# Patient Record
Sex: Female | Born: 1940 | Race: White | Hispanic: No | Smoking: Former smoker
Health system: Southern US, Community
[De-identification: ages and names within clinical notes are randomized; demographics above are authoritative.]

## PROBLEM LIST (undated history)

## (undated) DIAGNOSIS — M549 Dorsalgia, unspecified: Secondary | ICD-10-CM

## (undated) DIAGNOSIS — Z9989 Dependence on other enabling machines and devices: Secondary | ICD-10-CM

## (undated) DIAGNOSIS — I059 Rheumatic mitral valve disease, unspecified: Secondary | ICD-10-CM

## (undated) DIAGNOSIS — G4733 Obstructive sleep apnea (adult) (pediatric): Secondary | ICD-10-CM

## (undated) DIAGNOSIS — E039 Hypothyroidism, unspecified: Secondary | ICD-10-CM

## (undated) DIAGNOSIS — I6529 Occlusion and stenosis of unspecified carotid artery: Secondary | ICD-10-CM

## (undated) DIAGNOSIS — I729 Aneurysm of unspecified site: Secondary | ICD-10-CM

## (undated) DIAGNOSIS — Z720 Tobacco use: Secondary | ICD-10-CM

## (undated) DIAGNOSIS — M8580 Other specified disorders of bone density and structure, unspecified site: Secondary | ICD-10-CM

## (undated) DIAGNOSIS — D519 Vitamin B12 deficiency anemia, unspecified: Secondary | ICD-10-CM

## (undated) DIAGNOSIS — I2510 Atherosclerotic heart disease of native coronary artery without angina pectoris: Secondary | ICD-10-CM

## (undated) DIAGNOSIS — K219 Gastro-esophageal reflux disease without esophagitis: Secondary | ICD-10-CM

## (undated) DIAGNOSIS — D509 Iron deficiency anemia, unspecified: Secondary | ICD-10-CM

## (undated) DIAGNOSIS — K859 Acute pancreatitis without necrosis or infection, unspecified: Secondary | ICD-10-CM

## (undated) HISTORY — DX: Iron deficiency anemia, unspecified: D50.9

## (undated) HISTORY — DX: Dependence on other enabling machines and devices: Z99.89

## (undated) HISTORY — DX: Hypothyroidism, unspecified: E03.9

## (undated) HISTORY — PX: TONSILLECTOMY: SUR1361

## (undated) HISTORY — PX: BREAST LUMPECTOMY: SHX2

## (undated) HISTORY — DX: Atherosclerotic heart disease of native coronary artery without angina pectoris: I25.10

## (undated) HISTORY — DX: Other specified disorders of bone density and structure, unspecified site: M85.80

## (undated) HISTORY — DX: Acute pancreatitis without necrosis or infection, unspecified: K85.90

## (undated) HISTORY — PX: LUMBAR FUSION: SHX111

## (undated) HISTORY — PX: ABDOMINAL HYSTERECTOMY: SHX81

## (undated) HISTORY — DX: Vitamin B12 deficiency anemia, unspecified: D51.9

## (undated) HISTORY — PX: APPENDECTOMY: SHX54

## (undated) HISTORY — DX: Gastro-esophageal reflux disease without esophagitis: K21.9

## (undated) HISTORY — DX: Dorsalgia, unspecified: M54.9

## (undated) HISTORY — DX: Obstructive sleep apnea (adult) (pediatric): G47.33

## (undated) HISTORY — DX: Rheumatic mitral valve disease, unspecified: I05.9

## (undated) HISTORY — DX: Occlusion and stenosis of unspecified carotid artery: I65.29

## (undated) HISTORY — DX: Aneurysm of unspecified site: I72.9

## (undated) HISTORY — DX: Tobacco use: Z72.0

---

## 1993-02-23 HISTORY — PX: CORONARY ARTERY BYPASS GRAFT: SHX141

## 1997-07-27 ENCOUNTER — Ambulatory Visit (HOSPITAL_COMMUNITY): Admission: RE | Admit: 1997-07-27 | Discharge: 1997-07-27 | Payer: Self-pay | Admitting: Cardiology

## 1998-07-05 ENCOUNTER — Ambulatory Visit (HOSPITAL_COMMUNITY): Admission: RE | Admit: 1998-07-05 | Discharge: 1998-07-05 | Payer: Self-pay | Admitting: Cardiovascular Disease

## 1998-07-05 ENCOUNTER — Encounter: Payer: Self-pay | Admitting: Cardiovascular Disease

## 1999-06-17 ENCOUNTER — Encounter: Payer: Self-pay | Admitting: Neurosurgery

## 1999-06-17 ENCOUNTER — Ambulatory Visit (HOSPITAL_COMMUNITY): Admission: RE | Admit: 1999-06-17 | Discharge: 1999-06-17 | Payer: Self-pay | Admitting: Neurosurgery

## 2001-02-23 HISTORY — PX: SAPHENOUS VEIN GRAFT RESECTION: SHX2374

## 2001-02-23 HISTORY — PX: CARDIAC CATHETERIZATION: SHX172

## 2001-04-20 ENCOUNTER — Inpatient Hospital Stay (HOSPITAL_COMMUNITY): Admission: AD | Admit: 2001-04-20 | Discharge: 2001-04-22 | Payer: Self-pay | Admitting: Cardiology

## 2001-04-20 ENCOUNTER — Encounter: Payer: Self-pay | Admitting: Cardiology

## 2001-07-22 ENCOUNTER — Encounter: Admission: RE | Admit: 2001-07-22 | Discharge: 2001-07-22 | Payer: Self-pay | Admitting: Internal Medicine

## 2001-08-19 ENCOUNTER — Inpatient Hospital Stay (HOSPITAL_COMMUNITY): Admission: RE | Admit: 2001-08-19 | Discharge: 2001-08-22 | Payer: Self-pay | Admitting: *Deleted

## 2001-08-19 ENCOUNTER — Encounter: Payer: Self-pay | Admitting: *Deleted

## 2001-08-20 ENCOUNTER — Encounter: Payer: Self-pay | Admitting: *Deleted

## 2001-08-25 ENCOUNTER — Inpatient Hospital Stay (HOSPITAL_COMMUNITY): Admission: EM | Admit: 2001-08-25 | Discharge: 2001-08-26 | Payer: Self-pay | Admitting: *Deleted

## 2001-08-30 ENCOUNTER — Encounter: Payer: Self-pay | Admitting: *Deleted

## 2001-08-30 ENCOUNTER — Encounter: Admission: RE | Admit: 2001-08-30 | Discharge: 2001-08-30 | Payer: Self-pay | Admitting: *Deleted

## 2001-09-07 ENCOUNTER — Ambulatory Visit: Admission: RE | Admit: 2001-09-07 | Discharge: 2001-09-07 | Payer: Self-pay | Admitting: Internal Medicine

## 2004-01-07 ENCOUNTER — Ambulatory Visit: Payer: Self-pay | Admitting: Family Medicine

## 2004-03-04 ENCOUNTER — Encounter: Payer: Self-pay | Admitting: Internal Medicine

## 2004-03-04 LAB — CONVERTED CEMR LAB: Pap Smear: NEGATIVE

## 2004-05-19 ENCOUNTER — Ambulatory Visit: Payer: Self-pay | Admitting: Cardiology

## 2004-07-28 ENCOUNTER — Ambulatory Visit: Payer: Self-pay | Admitting: Family Medicine

## 2006-01-27 ENCOUNTER — Ambulatory Visit: Payer: Self-pay | Admitting: Gastroenterology

## 2006-02-09 ENCOUNTER — Ambulatory Visit (HOSPITAL_COMMUNITY): Admission: RE | Admit: 2006-02-09 | Discharge: 2006-02-09 | Payer: Self-pay | Admitting: Gastroenterology

## 2006-02-25 ENCOUNTER — Ambulatory Visit (HOSPITAL_COMMUNITY): Admission: RE | Admit: 2006-02-25 | Discharge: 2006-02-25 | Payer: Self-pay | Admitting: Gastroenterology

## 2006-03-02 ENCOUNTER — Ambulatory Visit: Payer: Self-pay | Admitting: Gastroenterology

## 2006-03-15 ENCOUNTER — Ambulatory Visit: Payer: Self-pay | Admitting: Gastroenterology

## 2007-03-04 ENCOUNTER — Encounter: Payer: Self-pay | Admitting: Internal Medicine

## 2007-03-07 ENCOUNTER — Encounter: Payer: Self-pay | Admitting: Internal Medicine

## 2007-03-07 LAB — CONVERTED CEMR LAB
BUN: 7 mg/dL
Basophils Relative: 0.6 %
Chloride: 102 meq/L
Creatinine, Ser: 0.8 mg/dL
Glucose, Bld: 86 mg/dL
HDL: 25 mg/dL
Hemoglobin: 14.6 g/dL
LDL Cholesterol: 103 mg/dL
Lymphocytes, automated: 43.4 %
Monocytes Relative: 2.9 %
Neutrophils Relative %: 51.8 %
Potassium: 5.3 meq/L
Sodium: 136 meq/L
Triglyceride fasting, serum: 287 mg/dL
WBC: 7.2 10*3/uL

## 2007-03-09 ENCOUNTER — Encounter: Payer: Self-pay | Admitting: Internal Medicine

## 2007-04-30 ENCOUNTER — Emergency Department (HOSPITAL_COMMUNITY): Admission: EM | Admit: 2007-04-30 | Discharge: 2007-04-30 | Payer: Self-pay | Admitting: Emergency Medicine

## 2007-05-04 ENCOUNTER — Encounter: Payer: Self-pay | Admitting: Internal Medicine

## 2007-05-04 LAB — CONVERTED CEMR LAB
Eosinophils Relative: 1 %
HCT: 44.3 %
HDL: 40 mg/dL
Hgb A1c MFr Bld: 5.2 %
LDL Cholesterol: 88 mg/dL
MCV: 94.9 fL
Monocytes Relative: 4 %
Neutrophils Relative %: 64 %
RBC: 4.67 M/uL
RDW: 14 %

## 2007-06-29 ENCOUNTER — Ambulatory Visit: Payer: Self-pay | Admitting: Cardiology

## 2007-09-05 ENCOUNTER — Ambulatory Visit: Payer: Self-pay

## 2007-09-05 ENCOUNTER — Encounter: Payer: Self-pay | Admitting: Cardiology

## 2007-09-06 ENCOUNTER — Encounter: Payer: Self-pay | Admitting: Internal Medicine

## 2008-04-02 ENCOUNTER — Emergency Department (HOSPITAL_COMMUNITY): Admission: EM | Admit: 2008-04-02 | Discharge: 2008-04-02 | Payer: Self-pay | Admitting: Emergency Medicine

## 2008-09-29 ENCOUNTER — Encounter: Payer: Self-pay | Admitting: Internal Medicine

## 2008-09-29 LAB — CONVERTED CEMR LAB
ALT: 17 units/L
AST: 24 units/L
Alkaline Phosphatase: 57 units/L
Calcium: 9.7 mg/dL
Hemoglobin: 14.9 g/dL
Lymphocytes, automated: 34 %
MCV: 92 fL
Monocytes Relative: 4 %
Neutrophils Relative %: 59 %
Sodium: 141 meq/L
Total Bilirubin: 0.2 mg/dL

## 2008-10-31 ENCOUNTER — Encounter: Payer: Self-pay | Admitting: Internal Medicine

## 2008-11-06 ENCOUNTER — Encounter: Payer: Self-pay | Admitting: Internal Medicine

## 2008-11-06 LAB — HM MAMMOGRAPHY

## 2008-12-31 DIAGNOSIS — K859 Acute pancreatitis without necrosis or infection, unspecified: Secondary | ICD-10-CM | POA: Insufficient documentation

## 2008-12-31 DIAGNOSIS — G4730 Sleep apnea, unspecified: Secondary | ICD-10-CM | POA: Insufficient documentation

## 2008-12-31 DIAGNOSIS — E039 Hypothyroidism, unspecified: Secondary | ICD-10-CM | POA: Insufficient documentation

## 2008-12-31 DIAGNOSIS — E119 Type 2 diabetes mellitus without complications: Secondary | ICD-10-CM

## 2008-12-31 DIAGNOSIS — I2510 Atherosclerotic heart disease of native coronary artery without angina pectoris: Secondary | ICD-10-CM | POA: Insufficient documentation

## 2009-02-27 ENCOUNTER — Ambulatory Visit: Payer: Self-pay | Admitting: Cardiology

## 2009-02-27 DIAGNOSIS — I6529 Occlusion and stenosis of unspecified carotid artery: Secondary | ICD-10-CM

## 2009-02-27 DIAGNOSIS — F17200 Nicotine dependence, unspecified, uncomplicated: Secondary | ICD-10-CM | POA: Insufficient documentation

## 2009-02-27 DIAGNOSIS — I059 Rheumatic mitral valve disease, unspecified: Secondary | ICD-10-CM | POA: Insufficient documentation

## 2009-03-01 ENCOUNTER — Encounter: Payer: Self-pay | Admitting: Internal Medicine

## 2009-03-01 LAB — CONVERTED CEMR LAB
Alkaline Phosphatase: 101 units/L
Basophils Relative: 2 %
CO2: 26 meq/L
Chloride: 99 meq/L
Creatinine, Ser: 0.79 mg/dL
Eosinophils Relative: 1 %
HCT: 44.1 %
Monocytes Relative: 5 %
Neutrophils Relative %: 47 %
RBC: 4.9 M/uL
Sodium: 138 meq/L
TSH: 1.74 microintl units/mL
WBC: 8.1 10*3/uL

## 2009-03-12 ENCOUNTER — Ambulatory Visit: Payer: Self-pay

## 2009-03-12 ENCOUNTER — Ambulatory Visit (HOSPITAL_COMMUNITY): Admission: RE | Admit: 2009-03-12 | Discharge: 2009-03-12 | Payer: Self-pay | Admitting: Cardiology

## 2009-03-12 ENCOUNTER — Telehealth (INDEPENDENT_AMBULATORY_CARE_PROVIDER_SITE_OTHER): Payer: Self-pay | Admitting: *Deleted

## 2009-03-12 ENCOUNTER — Ambulatory Visit: Payer: Self-pay | Admitting: Cardiology

## 2009-03-12 ENCOUNTER — Encounter: Payer: Self-pay | Admitting: Cardiology

## 2009-03-13 ENCOUNTER — Encounter: Payer: Self-pay | Admitting: Internal Medicine

## 2009-03-13 ENCOUNTER — Telehealth: Payer: Self-pay | Admitting: Cardiology

## 2009-03-25 ENCOUNTER — Encounter: Payer: Self-pay | Admitting: Internal Medicine

## 2009-06-07 ENCOUNTER — Encounter: Payer: Self-pay | Admitting: Internal Medicine

## 2009-06-07 LAB — CONVERTED CEMR LAB
ALT: 15 units/L
CO2: 28 meq/L
Chloride: 100 meq/L
Eosinophils Relative: 1 %
Lymphocytes, automated: 50 %
Monocytes Relative: 10 %
Neutrophils Relative %: 37 %
RDW: 13.6 %
Total Bilirubin: 0.2 mg/dL

## 2009-10-07 ENCOUNTER — Ambulatory Visit: Payer: Self-pay | Admitting: Internal Medicine

## 2009-10-07 DIAGNOSIS — G8929 Other chronic pain: Secondary | ICD-10-CM

## 2009-10-07 DIAGNOSIS — M549 Dorsalgia, unspecified: Secondary | ICD-10-CM

## 2009-10-07 DIAGNOSIS — K219 Gastro-esophageal reflux disease without esophagitis: Secondary | ICD-10-CM

## 2009-10-07 DIAGNOSIS — N289 Disorder of kidney and ureter, unspecified: Secondary | ICD-10-CM | POA: Insufficient documentation

## 2009-10-07 DIAGNOSIS — D509 Iron deficiency anemia, unspecified: Secondary | ICD-10-CM | POA: Insufficient documentation

## 2009-10-09 ENCOUNTER — Encounter: Payer: Self-pay | Admitting: Internal Medicine

## 2009-10-15 ENCOUNTER — Telehealth: Payer: Self-pay | Admitting: Internal Medicine

## 2009-10-22 ENCOUNTER — Encounter: Payer: Self-pay | Admitting: Internal Medicine

## 2009-12-05 ENCOUNTER — Encounter: Payer: Self-pay | Admitting: Internal Medicine

## 2009-12-06 ENCOUNTER — Encounter: Payer: Self-pay | Admitting: Internal Medicine

## 2009-12-16 ENCOUNTER — Telehealth: Payer: Self-pay | Admitting: Internal Medicine

## 2010-01-15 ENCOUNTER — Encounter: Payer: Self-pay | Admitting: Internal Medicine

## 2010-03-05 ENCOUNTER — Telehealth: Payer: Self-pay | Admitting: Internal Medicine

## 2010-03-14 ENCOUNTER — Ambulatory Visit: Admit: 2010-03-14 | Payer: Self-pay | Admitting: Internal Medicine

## 2010-03-16 ENCOUNTER — Encounter: Payer: Self-pay | Admitting: Gastroenterology

## 2010-03-16 ENCOUNTER — Encounter: Payer: Self-pay | Admitting: Neurological Surgery

## 2010-03-27 NOTE — Progress Notes (Signed)
Summary: med refills  Phone Note Refill Request Message from:  Fax from Pharmacy on October 15, 2009 3:46 PM  Refills Requested: Medication #1:  AMITRIPTYLINE HCL 50 MG TABS take 2 at bedtime  Medication #2:  OMEPRAZOLE 20 MG CPDR take 2 by mouth once daily [facility] [phone]  Initial call taken by: Orlan Leavens RMA,  October 15, 2009 3:47 PM    New/Updated Medications: PREMARIN 0.625 MG TABS (ESTROGENS CONJUGATED) take 1 by mouth once daily Prescriptions: PREMARIN 0.625 MG TABS (ESTROGENS CONJUGATED) take 1 by mouth once daily  #30 x 6   Entered by:   Orlan Leavens RMA   Authorized by:   Newt Lukes MD   Signed by:   Orlan Leavens RMA on 10/15/2009   Method used:   Faxed to ...       [facility] [address] (retail)       [address]       [place], [place]  [address]       Ph: [phone] or [phone]       Fax: [phone]   RxID:   [id number] OMEPRAZOLE 20 MG CPDR (OMEPRAZOLE) take 2 by mouth once daily  #60 x 6   Entered by:   Orlan Leavens RMA   Authorized by:   Newt Lukes MD   Signed by:   Orlan Leavens RMA on 10/15/2009   Method used:   Faxed to ...       [facility] [address] (retail)       [address]       [place], [place]  [address]       Ph: [phone] or [phone]       Fax: [phone]   RxID:   [id number] AMITRIPTYLINE HCL 50 MG TABS (AMITRIPTYLINE HCL) take 2 at bedtime  #60 x 6   Entered by:   Orlan Leavens RMA   Authorized by:   Newt Lukes MD   Signed by:   Orlan Leavens RMA on 10/15/2009   Method used:   Faxed to ...       [facility] [address] (retail)       [address]       [place], [place]  [address]       Ph: [phone] or [phone]       Fax: [phone]   RxID:   [id number]

## 2010-03-27 NOTE — Letter (Signed)
Summary: [facility]   Imported By: Lennie Odor 01/22/2010 14:12:31  _____________________________________________________________________  External Attachment:    Type:   Image     Comment:   External Document

## 2010-03-27 NOTE — Letter (Signed)
Summary: [facility]   Imported By: Sherian Rein 12/16/2009 15:55:12  _____________________________________________________________________  External Attachment:    Type:   Image     Comment:   External Document

## 2010-03-27 NOTE — Assessment & Plan Note (Signed)
Summary: [facility]   Visit Type:  Follow-up Primary Provider:  Eden Lathe PAc  CC:  CAD.  History of Present Illness: The patient presents for follow up of her known CAD.  Since I last saw her she has had no new cardiovascular complaints. She is in between primary care providers at this point. She says she did have lipids done about 3 months ago and was told everything was "all right".  She reports that she is active doing housework but does not exercise routinely. With activity such as going up and down stairs and vacuuming she does not get chest pressure, neck or arm discomfort. She does not have palpitations, presyncope or syncope. She does not have PND or orthopnea. Unfortunately she continues to smoke cigarettes.  Current Medications (verified): 1)  Synthroid 112 Mcg Tabs (Levothyroxine Sodium) .Marland Kitchen.. 1 By Mouth Daily 2)  Klor-Con M20 20 Meq Cr-Tabs (Potassium Chloride Crys Cr) .Marland Kitchen.. 1 By Mouth Daily 3)  Premarin 0.3 Mg Tabs (Estrogens Conjugated) .Marland Kitchen.. 1 By Mouth Daily 4)  Atenolol 50 Mg Tabs (Atenolol) .Marland Kitchen.. 1 By Mouth Daily 5)  Amitriptyline Hcl 50 Mg Tabs (Amitriptyline Hcl) .Marland Kitchen.. 1 By Mouth At Bedtime 6)  Nitrostat 0.4 Mg Subl (Nitroglycerin) .... As Needed 7)  Aspirin 325 Mg  Tabs (Aspirin) .... As Needed  Allergies (verified): 1)  ! Cipro 2)  ! Macrodantin 3)  ! Morphine  Past History:  Past Medical History: 1. Coronary artery disease (status post CABG in 1995 in [place],     [place].  Last catheterization in 2003 demonstrated left main 20%     stenosis, LAD 50-70% mid stenosis, circumflex 40-50% stenosis in     the AV groove, 30% right coronary artery stenosis.  Saphenous vein     graft to the marginal was patent.  A LIMA to the LAD was patent.) 2. Pseudoaneurysm requiring repair following her last catheterization. 3. Sleep apnea treated with CPAP. 4. Pancreatitis. 5. Hypothyroidism. 6. Diabetes mellitus.  7. Tobacco abuse 8. Ostopenia  Past Surgical  History: Appendectomy, back surgery, breast lumpectomy, fusion of lumbar       spine, hysterectomy,  tonsillectomy, CABG  Review of Systems       As stated in the HPI and negative for all other systems.   Vital Signs:  Patient profile:   70 year old female Height:      67 inches Weight:      152 pounds BMI:     23.89 Pulse rate:   62 / minute Resp:     16 per minute BP sitting:   142 / 86  (right arm)  Vitals Entered By: Marrion Coy, CNA (February 27, 2009 11:31 AM)  Physical Exam  General:  Well developed, well nourished, in no acute distress. Head:  normocephalic and atraumatic Eyes:  PERRLA/EOM intact; conjunctiva and lids normal. Mouth:  Teeth, gums and palate normal. Oral mucosa normal. Neck:  Neck supple, no JVD. No masses, thyromegaly or abnormal cervical nodes. Chest Wall:  no deformities or breast masses noted Lungs:  Clear bilaterally to auscultation and percussion. Heart:  Non-displaced PMI, chest non-tender; regular rate and rhythm, S1, S2 without murmurs, rubs or gallops. Carotid upstroke normal, no bruit. Normal abdominal aortic size, no bruits. Femorals normal pulses, no bruits. Pedals normal pulses. No edema, no varicosities. Abdomen:  Bowel sounds positive; abdomen soft and non-tender without masses, organomegaly, or hernias noted. No hepatosplenomegaly. Msk:  Back normal, normal gait. Muscle strength and tone normal. Extremities:  No clubbing or cyanosis. Neurologic:  Alert and oriented x 3. Skin:  Intact without lesions or rashes. Cervical Nodes:  no significant adenopathy Axillary Nodes:  no significant adenopathy Inguinal Nodes:  no significant adenopathy Psych:  Normal affect.   Detailed Cardiovascular Exam  Neck    Carotids: Carotids full and equal bilaterally without bruits.      Neck Veins: Normal, no JVD.    Heart    Inspection: no deformities or lifts noted.      Palpation: normal PMI with no thrills palpable.      Auscultation: S1 and S2  within normal limits, no S3, no S4, no clicks, no rubs, 2/6 apical systolic murmur radiating slightly anteriorly and to the axilla, holosystolic, no diastolic murmurs.  Vascular    Abdominal Aorta: no palpable masses, pulsations, or audible bruits.      Femoral Pulses: normal femoral pulses bilaterally.      Pedal Pulses: normal pedal pulses bilaterally.      Radial Pulses: normal radial pulses bilaterally.      Peripheral Circulation: no clubbing, cyanosis, or edema noted with normal capillary refill.     EKG  Procedure date:  02/27/2009  Findings:      sinus rhythm, rate 62, right axis deviation, old anteroseptal infarct, no acute ST-T wave changes.  Impression & Recommendations:  Problem # 1:  CAROTID ARTERY STENOSIS (ICD-433.10) The patient had nonobstructive carotid disease on Doppler in 2003. I will repeat this study. Orders: Carotid Duplex (Carotid Duplex)  Problem # 2:  MITRAL VALVE DISORDERS (ICD-424.0) It has been 18 months since she had an echocardiogram to followup moderate mitral regurgitation. I will repeat this study. Orders: Echocardiogram (Echo)  Problem # 3:  CAD (ICD-414.00) She has no new symptoms. We will continue to emphasize risk reduction. Orders: EKG w/ Interpretation (93000)  Problem # 4:  TOBACCO ABUSE (ICD-305.1) We discussed the need to stop smoking. (Greater than 3 minutes). She didn't tolerate Chantix. She can't afford patches. She is going to try cold perky.  Patient Instructions: 1)  Your physician recommends that you schedule a follow-up appointment in: 12 months in [place] 2)  Your physician recommends that you continue on your current medications as directed. Please refer to the Current Medication list given to you today. 3)  Your physician has requested that you have an echocardiogram.  Echocardiography is a painless test that uses sound waves to create images of your heart. It provides your doctor with information about the size and shape  of your heart and how well your heart's chambers and valves are working.  This procedure takes approximately one hour. There are no restrictions for this procedure. 4)  Your physician has requested that you have a carotid duplex. This test is an ultrasound of the carotid arteries in your neck. It looks at blood flow through these arteries that supply the brain with blood. Allow one hour for this exam. There are no restrictions or special instructions.

## 2010-03-27 NOTE — Progress Notes (Signed)
Summary: levothyroxine  Phone Note Refill Request Message from:  Fax from Pharmacy on December 16, 2009 9:54 AM  Refills Requested: Medication #1:  SYNTHROID 112 MCG TABS 1 by mouth daily  Method Requested: Electronic Initial call taken by: Orlan Leavens RMA,  December 16, 2009 9:54 AM    Prescriptions: SYNTHROID 112 MCG TABS (LEVOTHYROXINE SODIUM) 1 by mouth daily  #30 x 6   Entered by:   Orlan Leavens RMA   Authorized by:   Newt Lukes MD   Signed by:   Orlan Leavens RMA on 12/16/2009   Method used:   Electronically to        [facility] [address] (retail)       [address]       [place], [place]  [address]       Ph: [phone] or [phone]       Fax: [phone]   RxID:   [id number]

## 2010-03-27 NOTE — Progress Notes (Signed)
Summary: returning call  Phone Note Call from Patient Call back at Home Phone [phone]   Caller: Patient Reason for Call: Talk to Nurse Summary of Call: returning call Initial call taken by: Migdalia Dk,  March 13, 2009 11:47 AM  Follow-up for Phone Call        pt aware of results Follow-up by: Charolotte Capuchin, RN,  March 13, 2009 12:31 PM

## 2010-03-27 NOTE — Letter (Signed)
Summary: [facility]  [facility]   Imported By: Lennie Odor 12/06/2009 14:11:56  _____________________________________________________________________  External Attachment:    Type:   Image     Comment:   External Document

## 2010-03-27 NOTE — Progress Notes (Signed)
  Walk in Patient Form Recieved " Left Lab work" forward to Pam/Hochrein [facility]  March 12, 2009 3:09 PM

## 2010-03-27 NOTE — Miscellaneous (Signed)
Summary: Flu Vaccine / [facility]  Flu Vaccine / [facility]   Imported By: Lennie Odor 10/31/2009 15:12:23  _____________________________________________________________________  External Attachment:    Type:   Image     Comment:   External Document

## 2010-03-27 NOTE — Letter (Signed)
Summary: [facility]  [facility]   Imported By: Lennie Odor 12/06/2009 14:17:53  _____________________________________________________________________  External Attachment:    Type:   Image     Comment:   External Document

## 2010-03-27 NOTE — Letter (Signed)
Summary: [facility]  [facility]   Imported By: Lennie Odor 12/06/2009 14:18:15  _____________________________________________________________________  External Attachment:    Type:   Image     Comment:   External Document

## 2010-03-27 NOTE — Assessment & Plan Note (Signed)
Summary: new pt/medicaid/medicare/#/lb   Vital Signs:  Patient profile:   70 year old female Height:      67 inches (170.18 cm) Weight:      155.8 pounds (70.82 kg) O2 Sat:      95 % on Room air Temp:     97.1 degrees F (36.17 degrees C) oral Pulse rate:   77 / minute BP sitting:   120 / 70  (left arm) Cuff size:   regular  Vitals Entered By: Orlan Leavens RMA (October 07, 2009 1:53 PM)  O2 Flow:  Room air CC: New patient Is Patient Diabetic? No Pain Assessment Patient in pain? no        Primary Care Provider:  Newt Lukes MD  CC:  New patient.  History of Present Illness: new pt to me and our division, here to est care known to our card div but new to PC  1) chronic back pain - follows with ramos for same - daily narc use (mgmt by ramos) - pain radiates into legs - causes pain with standing or walking - no falls or bowel change - hoping for new steroid injection in near future  2) mass on left kidney - stable in size x 5 years -  no hematuria or flank pain - reports she is due for blood work to followup on kidney but denies ever seeing surg or renal specialist  3) hypothyroid - reports compliance with ongoing medical treatment and no changes in medication dose or frequency. denies adverse side effects related to current therapy. no weight, skin or bowel changes  4) DM2 hx - reports no longer dm pt since losing 50# - does not check sugras - does not take meds -  Preventive Screening-Counseling & Management  Alcohol-Tobacco     Alcohol drinks/day: 0     Alcohol Counseling: not indicated; patient does not drink     Smoking Status: current     Smoking Cessation Counseling: yes     Tobacco Counseling: to quit use of tobacco products  Caffeine-Diet-Exercise     Diet Counseling: not indicated; diet is assessed to be healthy     Exercise Counseling: to improve exercise regimen     Depression Counseling: not indicated; screening negative for  depression  Safety-Violence-Falls     Seat Belt Counseling: not indicated; patient wears seat belts     Helmet Counseling: not indicated; patient wears helmet when riding bicycle/motocycle     Fall Risk Counseling: not indicated; no significant falls noted  Current Medications (verified): 1)  Synthroid 112 Mcg Tabs (Levothyroxine Sodium) .Marland Kitchen.. 1 By Mouth Daily 2)  Klor-Con M20 20 Meq Cr-Tabs (Potassium Chloride Crys Cr) .Marland Kitchen.. 1 By Mouth Daily 3)  Premarin 0.3 Mg Tabs (Estrogens Conjugated) .Marland Kitchen.. 1 By Mouth Daily 4)  Atenolol 50 Mg Tabs (Atenolol) .Marland Kitchen.. 1 By Mouth Daily 5)  Amitriptyline Hcl 50 Mg Tabs (Amitriptyline Hcl) .... Take 2 At Bedtime 6)  Nitrostat 0.4 Mg Subl (Nitroglycerin) .... As Needed 7)  Aspirin 325 Mg  Tabs (Aspirin) .... As Needed 8)  Cyanocobalamin 1000 Mcg/ml Soln (Cyanocobalamin) .... Take Injectin Q 2 Weeks 9)  Meclizine Hcl 25 Mg Tabs (Meclizine Hcl) .... Take As Needed 10)  Omeprazole 20 Mg Cpdr (Omeprazole) .... Take 2 By Mouth Once Daily 11)  Oxycontin 10 Mg Xr12h-Tab (Oxycodone Hcl) .... Take 1 Q 12 Hours As Needed  Allergies (verified): 1)  ! Cipro 2)  ! Macrodantin 3)  ! Morphine  Past History:  Past Medical History: 1. Coronary artery disease (status post CABG in 1995 in [place],  [place].      Last catheterization in 2003 demonstrated left main 20%     stenosis, LAD 50-70% mid stenosis, circumflex 40-50% stenosis in     the AV groove, 30% right coronary artery stenosis.  Saphenous vein     graft to the marginal was patent.  A LIMA to the LAD was patent.) 2. Pseudoaneurysm requiring repair following her last catheterization. 3. Sleep apnea treated with CPAP. 4. Pancreatitis, hx 5. Hypothyroidism. 6. Diabetes mellitus, diet controlled since weight loss 7. Tobacco abuse 8. Ostopenia Anemia-iron deficiency GERD  MD roster: cards - hochrein pain - ramos  Past Surgical History: Appendectomy Coronary artery bypass graft - 1995 Hysterectomy Lumbar  fusion  Lumpectomy, breast Tonsillectomy  Family History: Family History High cholesterol (parent) Family History Hypertension (parent) Stroke (parent)     Social History: Lives in [place] and quit smoking years ago.  then resumed smoking 2009 - now 1ppd widowed 03/2009, now lives alone - No regular exercise.Smoking Status:  current  Review of Systems       see HPI above. I have reviewed all other systems and they were negative.   Physical Exam  General:  weathered but alert, well-developed, well-nourished, and cooperative to examination.   heavy tobacco smell in room Head:  Normocephalic and atraumatic without obvious abnormalities. No apparent alopecia or balding. Eyes:  vision grossly intact; pupils equal, round and reactive to light.  conjunctiva and lids normal.    Ears:  normal pinnae bilaterally, without erythema, swelling, or tenderness to palpation. TMs clear, without effusion, or cerumen impaction. Hearing grossly normal bilaterally  Mouth:  upper teeth plate and gums in good repair; mucous membranes moist, without lesions or ulcers. oropharynx clear without exudate, no erythema.  Neck:  supple, full ROM, no masses, no thyromegaly; no thyroid nodules or tenderness. no JVD or carotid bruits.   Lungs:  normal respiratory effort, no intercostal retractions or use of accessory muscles; normal breath sounds bilaterally - no crackles and no wheezes.    Heart:  normal rate, regular rhythm, no murmur, and no rub. BLE without edema.  Abdomen:  soft, non-tender, normal bowel sounds, no distention; no masses and no appreciable hepatomegaly or splenomegaly.   Msk:  mild kyphosis Neurologic:  alert & oriented X3 and cranial nerves II-XII symetrically intact.  strength normal in all extremities, sensation intact to light touch, and gait normal. speech fluent without dysarthria or aphasia; follows commands with good comprehension.  Psych:  Oriented X3, memory intact for recent and remote,  normally interactive, good eye contact, not anxious appearing, not depressed appearing, and not agitated.      Impression & Recommendations:  Problem # 1:  KIDNEY DISEASE (ICD-593.9) ?mass per pt report - stable on imaging for >5 yrs - send for prior records and eval further if/as needed  Problem # 2:  TOBACCO ABUSE (ICD-305.1)  5 minutes today spent on patient education regarding the unhealthy effects of continued tobacco abuse and encouragment of cessation including medical options available to help patient to quit smoking.   Orders: Tobacco use cessation intermediate 3-10 minutes ([address])  Problem # 3:  DM (ICD-250.00) diet controlled since >50# wt loss years ago - send for records and review last a1c - check if/as needed Her updated medication list for this problem includes:    Aspirin 325 Mg Tabs (Aspirin) .Marland Kitchen... As needed  Problem # 4:  HYPOTHYROIDISM (ICD-244.9) no  dose changes in years - send for last labs - check TSH and adjust as needed next OV Her updated medication list for this problem includes:    Synthroid 112 Mcg Tabs (Levothyroxine sodium) .Marland Kitchen... 1 by mouth daily  Problem # 5:  CAD (ICD-414.00)  Her updated medication list for this problem includes:    Atenolol 50 Mg Tabs (Atenolol) .Marland Kitchen... 1 by mouth daily    Nitrostat 0.4 Mg Subl (Nitroglycerin) .Marland Kitchen... As needed    Aspirin 325 Mg Tabs (Aspirin) .Marland Kitchen... As needed    Bumetanide 0.5 Mg Tabs (Bumetanide) .Marland Kitchen... 1 by mouth once daily as needed for swelling/fluid  She has no active anginal symptoms. Continue to emphasize risk reduction, esp smoking cessation  Orders: Tobacco use cessation intermediate 3-10 minutes ([address])  Complete Medication List: 1)  Synthroid 112 Mcg Tabs (Levothyroxine sodium) .Marland Kitchen.. 1 by mouth daily 2)  Klor-con M20 20 Meq Cr-tabs (Potassium chloride crys cr) .Marland Kitchen.. 1 by mouth daily 3)  Premarin 0.3 Mg Tabs (Estrogens conjugated) .Marland Kitchen.. 1 by mouth daily 4)  Atenolol 50 Mg Tabs (Atenolol) .Marland Kitchen.. 1 by  mouth daily 5)  Amitriptyline Hcl 50 Mg Tabs (Amitriptyline hcl) .... Take 2 at bedtime 6)  Nitrostat 0.4 Mg Subl (Nitroglycerin) .... As needed 7)  Aspirin 325 Mg Tabs (Aspirin) .... As needed 8)  Cyanocobalamin 1000 Mcg/ml Soln (Cyanocobalamin) .... Take injectin q 2 weeks 9)  Meclizine Hcl 25 Mg Tabs (Meclizine hcl) .... Take as needed 10)  Omeprazole 20 Mg Cpdr (Omeprazole) .... Take 2 by mouth once daily 11)  Oxycontin 10 Mg Xr12h-tab (Oxycodone hcl) .... Take 1 q 12 hours as needed 12)  Bumetanide 0.5 Mg Tabs (Bumetanide) .Marland Kitchen.. 1 by mouth once daily as needed for swelling/fluid  Patient Instructions: 1)  it was good to see you today. 2)  medications and history reviewed today - no changes recommended today - 3)  will send for records from dr. Eula Fried to review and then order tests as needed  4)  Tobacco is very bad for your health and your loved ones! You Should stop smoking! use nicotine patches or gum to help yourself do this 5)  continue to go to dr.ramos for your pain meds as needed  6)  Please schedule a follow-up appointment in 3-6 months to review records and medications and lab tests, call sooner if problems.

## 2010-03-27 NOTE — Progress Notes (Signed)
Summary: b12   Phone Note Refill Request Message from:  Fax from Pharmacy on March 05, 2010 1:01 PM  Refills Requested: Medication #1:  CYANOCOBALAMIN 1000 MCG/ML SOLN take injectin q 2 weeks 30ml [facility]   Method Requested: Electronic Initial call taken by: Orlan Leavens RMA,  March 05, 2010 1:01 PM  Follow-up for Phone Call        We never filled B12 for patient. is this ok to send rx Follow-up by: Orlan Leavens RMA,  March 05, 2010 1:03 PM  Additional Follow-up for Phone Call Additional follow up Details #1::        yes - ok to fill as requested Additional Follow-up by: Newt Lukes MD,  March 05, 2010 1:06 PM    Prescriptions: CYANOCOBALAMIN 1000 MCG/ML SOLN (CYANOCOBALAMIN) take injectin q 2 weeks  #47ml x 1   Entered by:   Orlan Leavens RMA   Authorized by:   Newt Lukes MD   Signed by:   Orlan Leavens RMA on 03/05/2010   Method used:   Electronically to        [facility] [address] (retail)       [address]       [place], [place]  [address]       Ph: [phone] or [phone]       Fax: [phone]   RxID:   [id number]

## 2010-03-27 NOTE — Consult Note (Signed)
Summary: [facility]   Imported By: Sherian Rein 12/16/2009 15:54:14  _____________________________________________________________________  External Attachment:    Type:   Image     Comment:   External Document

## 2010-04-03 ENCOUNTER — Other Ambulatory Visit: Payer: Self-pay | Admitting: Internal Medicine

## 2010-04-03 ENCOUNTER — Ambulatory Visit (INDEPENDENT_AMBULATORY_CARE_PROVIDER_SITE_OTHER): Payer: Medicare Other | Admitting: Internal Medicine

## 2010-04-03 ENCOUNTER — Encounter: Payer: Self-pay | Admitting: Internal Medicine

## 2010-04-03 ENCOUNTER — Other Ambulatory Visit: Payer: Medicare Other

## 2010-04-03 DIAGNOSIS — E039 Hypothyroidism, unspecified: Secondary | ICD-10-CM

## 2010-04-03 DIAGNOSIS — N289 Disorder of kidney and ureter, unspecified: Secondary | ICD-10-CM

## 2010-04-03 DIAGNOSIS — R1310 Dysphagia, unspecified: Secondary | ICD-10-CM

## 2010-04-03 DIAGNOSIS — K219 Gastro-esophageal reflux disease without esophagitis: Secondary | ICD-10-CM

## 2010-04-03 LAB — CBC WITH DIFFERENTIAL/PLATELET
Basophils Absolute: 0 10*3/uL (ref 0.0–0.1)
HCT: 39.3 % (ref 36.0–46.0)
Lymphs Abs: 3.2 10*3/uL (ref 0.7–4.0)
MCV: 91.8 fl (ref 78.0–100.0)
Monocytes Absolute: 0.4 10*3/uL (ref 0.1–1.0)
Platelets: 260 10*3/uL (ref 150.0–400.0)
RDW: 13 % (ref 11.5–14.6)

## 2010-04-07 ENCOUNTER — Telehealth: Payer: Self-pay | Admitting: Internal Medicine

## 2010-04-10 NOTE — Assessment & Plan Note (Signed)
Summary: F/U APPT//#  CD   Vital Signs:  Patient profile:   70 year old female Height:      67 inches (170.18 cm) Weight:      160.0 pounds (72.73 kg) BMI:     25.15 O2 Sat:      96 % on Room air Temp:     98.5 degrees F (36.94 degrees C) oral Pulse rate:   70 / minute BP sitting:   142 / 80  (left arm) Cuff size:   large  Vitals Entered By: Orlan Leavens RMA (April 03, 2010 10:49 AM)  O2 Flow:  Room air CC: follow-up visit Is Patient Diabetic? Yes Did you bring your meter with you today? No Pain Assessment Patient in pain? no        Primary Care Provider:  Newt Lukes MD  CC:  follow-up visit.  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 denies adverse side effects related to current therapy. no weight, skin or bowel changes  4) DM2 hx - reports no longer dm pt since losing 50# - does not check sugars - does not take meds -  c/o trouble swallowing - like "rocks in my throat" +reguritation and occ choking affects liquids and solids - +burning SSCP - noncompliant with PPI  ongong symptoms >66mo denies weight loss  Clinical Review Panels:  Diabetes Management   HgBA1C:  5.3 (03/01/2009)   Creatinine:  0.72 (06/07/2009)   Last Flu Vaccine:  Historical given @ [facility] Fl[facility]in lot # [id number] exp 06/2010 (10/22/2009)  CBC   WBC:  5.4 (06/07/2009)   RBC:  4.57 (06/07/2009)   Hgb:  13.7 (06/07/2009)   Hct:  40.6  (06/07/2009)   Platelets:  300 (06/07/2009)   MCV  89 (06/07/2009)   RDW  13.6 (06/07/2009)   PMN:  37 (06/07/2009)   Monos:  10 (06/07/2009)   Eosinophils:  1 (06/07/2009)   Basophil:  2 (06/07/2009)  Complete Metabolic Panel   Glucose:  77 (06/07/2009)   Sodium:  139 (06/07/2009)   Potassium:  3.9 (06/07/2009)   Chloride:  100 (06/07/2009)   CO2:  28 (06/07/2009)   BUN:  12 (06/07/2009)   Creatinine:  0.72 (06/07/2009)   Albumin:  3.8 (06/07/2009)   Total Protein:  6.4 (06/07/2009)   Calcium:  9.1 (06/07/2009)   Total Bili:  0.2 (06/07/2009)   Alk Phos:  88 (06/07/2009)   SGPT (ALT):  15 (06/07/2009)   SGOT (AST):  23 (06/07/2009)   Current Medications (verified): 1)  Synthroid 112 Mcg Tabs (Levothyroxine Sodium) .Marland Kitchen.. 1 By Mouth Daily 2)  Klor-Con M20 20 Meq Cr-Tabs (Potassium Chloride Crys Cr) .Marland Kitchen.. 1 By Mouth Daily 3)  Atenolol 50 Mg Tabs (Atenolol) .Marland Kitchen.. 1 By Mouth Daily 4)  Amitriptyline Hcl 50 Mg Tabs (Amitriptyline Hcl) .... Take 2 At Bedtime 5)  Nitrostat 0.4 Mg Subl (  Nitroglycerin) .... As Needed 6)  Aspirin 325 Mg  Tabs (Aspirin) .... As Needed 7)  Cyanocobalamin 1000 Mcg/ml Soln (Cyanocobalamin) .... Take Injectin Q 2 Weeks 8)  Meclizine Hcl 25 Mg Tabs (Meclizine Hcl) .... Take As Needed 9)  Omeprazole 20 Mg Cpdr (Omeprazole) .... Take 2 By Mouth Once Daily 10)  Oxycontin 10 Mg Xr12h-Tab (Oxycodone Hcl) .... Take 1 Q 12 Hours As Needed 11)  Bumetanide 0.5 Mg Tabs (Bumetanide) .Marland Kitchen.. 1 By Mouth Once Daily As Needed For Swelling/fluid 12)  Premarin 0.625 Mg Tabs (Estrogens Conjugated) .... Take 1 By Mouth Once Daily  Allergies (verified): 1)  ! Cipro 2)  ! Macrodantin 3)  ! Morphine  Past History:  Past Medical History: 1. Coronary artery disease (status post CABG in 1995 in [place],  [place].  Last catheterization in 2003 demonstrated left main 20% stenosis, LAD 50-70% mid stenosis, circumflex 40-50% stenosis in the AV groove, 30% right coronary artery stenosis.   Saphenous vein graft to the marginal was patent.  A LIMA to the LAD was patent.) 2. Pseudoaneurysm requiring repair following her last catheterization. 3. Sleep apnea treated with CPAP. 4. Pancreatitis, hx 5. Hypothyroidism. 6. Diabetes mellitus, diet controlled since weight loss 7. Tobacco abuse - quit 02/2010 8. Osteopenia Anemia-iron deficiency, b12 defic pernicious anemia hx GERD  MD roster: cards - hochrein pain - ramos  Review of Systems  The patient denies weight loss, chest pain, headaches, and abdominal pain.    Physical Exam  General:  weathered but alert, well-developed, well-nourished, and cooperative to examination.   heavy tobacco smell in room Lungs:  normal respiratory effort, no intercostal retractions or use of accessory muscles; normal breath sounds bilaterally - no crackles and no wheezes.    Heart:  normal rate, regular rhythm, no murmur, and no rub. BLE without edema.  Psych:  Oriented X3, memory intact for recent and remote, normally interactive, good eye contact, not anxious appearing, not depressed appearing, and not agitated.      Impression & Recommendations:  Problem # 1:  DYSPHAGIA UNSPECIFIED (ICD-787.20) smoker hx - may be esophagitis -  rec PPI use but also refer to GI for eval and tx as needed as pt concerned symptoms due to cancer Orders: TLB-CBC Platelet - w/Differential (85025-CBCD) Gastroenterology Referral (GI)  Problem # 2:  GERD (ICD-530.81)  Her updated medication list for this problem includes:    Omeprazole 20 Mg Cpdr (Omeprazole) .Marland Kitchen... Take 2 by mouth once daily  Orders: Gastroenterology Referral (GI)  Labs Reviewed: Hgb: 13.7 (06/07/2009)   Hct: 40.6 (06/07/2009)  Problem # 3:  HYPOTHYROIDISM (ICD-244.9)  Her updated medication list for this problem includes:    Synthroid 112 Mcg Tabs (Levothyroxine sodium) .Marland Kitchen... 1 by mouth daily  Orders: TLB-TSH (Thyroid Stimulating Hormone) (84443-TSH)  Labs Reviewed: TSH: 2.52  (06/07/2009)    HgBA1c: 5.3 (03/01/2009) Chol: 181 (05/04/2007)   HDL: 40 (05/04/2007)   LDL: 88 (05/04/2007)   TG: 264 (05/04/2007)  Problem # 4:  KIDNEY DISEASE (ICD-593.9)  Orders: Radiology Referral (Radiology)  left side mass per 02/2009 [place] report (see emr copy) - pt reports stable on imaging for >5 yrs - repeat US now to monitor  Complete Medication List: 1)  Synthroid 112 Mcg Tabs (Levothyroxine sodium) .Marland Kitchen.. 1 by mouth daily 2)  Klor-con M20 20 Meq Cr-tabs (Potassium chloride crys cr) .Marland Kitchen.. 1 by mouth daily 3)  Atenolol 50 Mg Tabs (Atenolol) .Marland Kitchen.. 1 by mouth daily 4)  Amitriptyline Hcl 50 Mg Tabs (Amitriptyline  hcl) .... Take 2 at bedtime 5)  Nitrostat 0.4 Mg Subl (Nitroglycerin) .... As needed 6)  Aspirin 325 Mg Tabs (Aspirin) .... As needed 7)  Cyanocobalamin 1000 Mcg/ml Soln (Cyanocobalamin) .... Take injectin q 2 weeks 8)  Meclizine Hcl 25 Mg Tabs (Meclizine hcl) .... Take as needed 9)  Omeprazole 20 Mg Cpdr (Omeprazole) .... Take 2 by mouth once daily 10)  Oxycontin 10 Mg Xr12h-tab (Oxycodone hcl) .... Take 1 q 12 hours as needed 11)  Bumetanide 0.5 Mg Tabs (Bumetanide) .Marland Kitchen.. 1 by mouth once daily as needed for swelling/fluid 12)  Premarin 0.625 Mg Tabs (Estrogens conjugated) .... Take 1 by mouth once daily  Other Orders: Misc. Referral (Misc. Ref)  Patient Instructions: 1)  it was good to see you today. 2)  take the omeprazole everyday for your reflux symptoms  3)  we'll make referral to GI for swallowing problems, repeat ultrasound of kidneys and repeat mammogram. Our office will contact you regarding these appointments once made.  4)  test(s) ordered today - your results will be posted on the phone tree for review in 48-72 hours from the time of test completion; call [phone] and enter your 9 digit MRN (listed above on this page, just below your name); if any changes need to be made or there are abnormal results, you will be contacted directly.  5)  congrats on  giving up those cigarettes! keep up the good work 6)  continue to go to dr.ramos for your pain meds as needed  7)  Please schedule a follow-up appointment in 3-6 months to review medications and tests, call sooner if problems.    Orders Added: 1)  TLB-TSH (Thyroid Stimulating Hormone) [84443-TSH] 2)  TLB-CBC Platelet - w/Differential [85025-CBCD] 3)  Gastroenterology Referral [GI] 4)  Radiology Referral [Radiology] 5)  Misc. Referral [Misc. Ref] 6)  Est. Patient Level IV [86578]

## 2010-04-16 NOTE — Progress Notes (Signed)
Summary: lab results  Phone Note Call from Patient Call back at Home Phone [phone]   Caller: Patient Summary of Call: Pt states she received call from office this morning and also is requesting results of lab results Initial call taken by: Brenton Grills CMA Duncan Dull),  April 07, 2010 11:05 AM  Follow-up for Phone Call        all labs normal- ?pcc calling  - thanks Follow-up by: Newt Lukes MD,  April 07, 2010 1:07 PM  Additional Follow-up for Phone Call Additional follow up Details #1::        pt informed Additional Follow-up by: Brenton Grills CMA Duncan Dull),  April 07, 2010 1:12 PM

## 2010-04-21 ENCOUNTER — Other Ambulatory Visit: Payer: Self-pay | Admitting: Internal Medicine

## 2010-04-21 DIAGNOSIS — N289 Disorder of kidney and ureter, unspecified: Secondary | ICD-10-CM

## 2010-05-06 ENCOUNTER — Encounter: Payer: Self-pay | Admitting: Internal Medicine

## 2010-05-07 ENCOUNTER — Encounter: Payer: Self-pay | Admitting: Internal Medicine

## 2010-05-08 ENCOUNTER — Encounter: Payer: Self-pay | Admitting: Internal Medicine

## 2010-05-13 NOTE — Medication Information (Signed)
Summary: Order Oxygen / [facility]  Order Oxygen / [facility]   Imported By: Lennie Odor 05/09/2010 15:58:37  _____________________________________________________________________  External Attachment:    Type:   Image     Comment:   External Document

## 2010-05-13 NOTE — Medication Information (Signed)
Summary: Diabetes Supplies / [facility]  Order / [facility]   Imported By: Lennie Odor 05/09/2010 15:57:04  _____________________________________________________________________  External Attachment:    Type:   Image     Comment:   External Document

## 2010-05-13 NOTE — Medication Information (Signed)
Summary: Order CPAP Supplies / [facility]  Order / [facility]   Imported By: Lennie Odor 05/08/2010 11:41:21  _____________________________________________________________________  External Attachment:    Type:   Image     Comment:   External Document

## 2010-05-14 ENCOUNTER — Other Ambulatory Visit: Payer: Medicare Other

## 2010-05-15 ENCOUNTER — Other Ambulatory Visit: Payer: Self-pay | Admitting: Internal Medicine

## 2010-05-22 NOTE — Letter (Signed)
Summary: Denial x2/[facility]  Denial x2/[facility]   Imported By: Lester Geneva 05/12/2010 08:21:42  _____________________________________________________________________  External Attachment:    Type:   Image     Comment:   External Document

## 2010-05-23 ENCOUNTER — Ambulatory Visit: Payer: Medicare Other | Admitting: Internal Medicine

## 2010-06-10 LAB — URINALYSIS, ROUTINE W REFLEX MICROSCOPIC
Bilirubin Urine: NEGATIVE
Glucose, UA: NEGATIVE mg/dL
Ketones, ur: NEGATIVE mg/dL
Protein, ur: NEGATIVE mg/dL
pH: 7.5 (ref 5.0–8.0)

## 2010-06-11 ENCOUNTER — Ambulatory Visit: Payer: Medicare Other | Admitting: Cardiology

## 2010-06-13 ENCOUNTER — Other Ambulatory Visit: Payer: Self-pay | Admitting: Internal Medicine

## 2010-06-13 NOTE — Telephone Encounter (Signed)
To robin   

## 2010-07-03 ENCOUNTER — Other Ambulatory Visit: Payer: Self-pay | Admitting: Internal Medicine

## 2010-07-08 NOTE — Assessment & Plan Note (Signed)
[facility]                            CARDIOLOGY OFFICE NOTE   NAME:Amber Bean, Amber Bean                     MRN:          [id number]  DATE:06/29/2007                            DOB:          Jul 03, 1940    PRIMARY CARE PHYSICIAN:  Samuel Jester, MD.   REASON FOR PRESENTATION:  Evaluate patient with coronary disease.   HISTORY OF PRESENT ILLNESS:  The patient returns after almost a 4 year  absence.  She has a history of coronary disease as described below.  She  says she was not able to take care of herself as she was taking care of  her sister.  She is now being referred back as her sister is staying  with her nieces and nephews.   The patient does not report any recent cardiovascular symptoms.  She  denies any chest discomfort, neck or arm discomfort.  She does not  report any palpitations.  She says she gets short of breath climbing a  flight of stairs, but this has been a chronic problem.  She does not  have any resting shortness of breath.  Denies any PND or orthopnea.   Of note, the patient did have an episode 6 months ago where she could  not control her body.  She felt wobbly and had difficulty walking.  She  did see her primary care physician and did not have any obvious etiology  for this and has had no recurrence.   PAST MEDICAL HISTORY:  1. Coronary artery disease (status post CABG in 1995 in [place],      [place].  Last catheterization in 2003 demonstrated left main 20%      stenosis, LAD 50-70% mid stenosis, circumflex 40-50% stenosis in      the AV groove, 30% right coronary artery stenosis.  Saphenous vein      graft to the marginal was patent.  A LIMA to the LAD was patent.)  2. Pseudoaneurysm requiring repair following her last catheterization.  3. Sleep apnea treated with CPAP.  4. Pancreatitis.  5. Hypothyroidism.  6. Diabetes mellitus.   PAST SURGICAL HISTORY:  1. Back surgery.  2. Hysterectomy.  3. Tonsillectomy.  4.  Breast surgery.  5. CABG as described.  6. Repair of a pseudoaneurysm.   ALLERGIES/INTOLERANCES:  1. CIPRO.  2. MACRODANTIN.  3. EPINEPHRINE.  4. IV CONTRAST.   MEDICATIONS:  1. Nitroglycerin p.r.n.  2. Atenolol 50 mg daily.  3. Synthroid 112 mcg daily.  4. Premarin 0.625 mg daily.  5. Amitriptyline 50 mg q.h.s.  6. Potassium 20 mEq b.i.d.  7. Meclizine.  8. Valium.  9. Prilosec.  10.Albuterol.  11.Advair.  12.Revatio 20 mg t.i.d.  13.Pangestyme.  14.Hyoscyamine.  15.Fenofibrate 160 mg daily.  16.Detrol.  17.Bumetanide 1 mg b.i.d.  18.B12.   SOCIAL HISTORY:  The patient was a previous smoker.  She quit 10 years  ago.  She was a CNA.   FAMILY HISTORY:  Contributory for early coronary artery disease in her  sister.   REVIEW OF SYSTEMS:  As stated in the HPI.  Positive for headaches,  diarrhea  alternating with constipation, reflux, joint pains.  Negative  for other systems.   PHYSICAL EXAMINATION:  GENERAL:  The patient is in no distress.  VITAL SIGNS:  Blood pressure 112/66, heart rate 63 and regular, body  mass index 25.  HEENT:  Eyelids are unremarkable.  Pupils are equal,  round and reactive to light.  Fundi not visualized.  Oral mucosa  unremarkable.  NECK:  No jugular venous distention at 45 degrees.  Carotid upstroke  brisk and symmetric.  No bruits, no thyromegaly.  LYMPHATICS:  No cervical, axillary, inguinal adenopathy.  LUNGS:  Decreased breath sounds without wheezing or crackles.  BACK:  No costovertebral angle tenderness.  CHEST:  Well-healed sternotomy scar.  HEART:  PMI not displaced or sustained, S1-S2 within normal limits.  No  S3, no S4, 2/6 apical systolic murmur radiating slightly out the aortic  outflow tract.  No diastolic murmurs.  ABDOMEN:  Flat, positive bowel sounds normal in frequency and pitch, no  bruits, rebound, guarding, no midline pulsatile mass.  No hepatomegaly,  no splenomegaly.  SKIN:  No rashes, no nodules.  EXTREMITIES:  2+  pulses throughout, no edema, cyanosis or clubbing.  NEURO:  Oriented to person, place, time.  Cranial nerves II-XII are  grossly intact.  Motor grossly intact.   DIAGNOSTICS:  EKG:  Sinus rhythm, rate 63, axis within normal limits.  Intervals within normal limits.  No acute ST wave change.   ASSESSMENT/PLAN:  1. Coronary disease.  The patient has coronary disease as described.      It has been almost 5 years since her last stress perfusion study.      She does have dyspnea with exertion.  Given this screening, a      stress perfusion study is indicated.  I will go ahead and arrange      this.  2. Pulmonary hypertension.  There is a questionable history of      pulmonary hypertension.  She has been on treatment in the past.  My      last echo does not confirm this.  She does have a systolic murmur      as well that was aortic sclerosis in the past.  I am going to get      an echocardiogram to further evaluate this murmur and the question      of pulmonary hypertension.  3. Hypertension.  The patient's blood pressure is currently controlled      on the medications as listed.  4. Diabetes.  This is followed by Dr. Freddy Finner. al.  5. Risk reduction.  The patient should be on a statin, though I am not      sure if she would comply with this.  I would defer to Dr. Charm Barges.   FOLLOW UP:  I should see her at least yearly.     Rollene Rotunda, MD, [facility]  Electronically Signed    JH/MedQ  DD: 06/29/2007  DT: 06/29/2007  Job #: [id number]   cc:   Samuel Jester

## 2010-07-09 ENCOUNTER — Ambulatory Visit: Payer: Medicare Other | Admitting: Cardiology

## 2010-07-11 NOTE — Procedures (Signed)
[facility]  Patient:    Amber Bean, Amber Bean Visit Number: [id number] MRN: [id number]          Service Type: DSU Location: 2000 2002 01 Attending Physician:  Melvenia Needles Dictated by:   Judie Petit, M.D. Admit Date:  08/19/2001 Discharge Date: 08/22/2001                             Procedure Report  INTUBATION NOTE  DATE OF BIRTH:  Sep 10, 1940  The anesthesia team was called to intubate this 70 year old female with a past medical history significant for coronary artery disease that underwent cardiac catheterization today and now with groin hematoma.  The patient has experienced hypertension and is on dopamine and phenylephrine drip presently. The patient was presently in respiratory distress.  Prior to arrival the patient had experienced several episodes of vomiting.  The case was discussed briefly with Dr. Chales Abrahams.  Ejection fraction was within normal limits.  The patients airway was secured with etomidate 16 mg and succinylcholine 140 mg IV     with cricoid pressure.  A #8 endotracheal tube was placed on the initial attempt.  Positive end tidal CO2 by easy cap.  Cords were clear but emesis noted in the posterior pharynx by the CRNA.  Bilateral breath sounds. Chest x-ray pending. Dictated by:   Judie Petit, M.D. Attending Physician:  Melvenia Needles DD:  08/19/01 TD:  08/22/01 Job: [id number]

## 2010-07-11 NOTE — H&P (Signed)
Amber Bean. [facility]  Patient:    Amber Bean, Amber Bean Visit Number: [id number] MRN: [id number]          Service Type: MED Location: [phone] Attending Physician:  Amber Bean Dictated by:   Amber Bean, M.D. [facility] Admit Date:  08/25/2001 Discharge Date: 08/26/2001                           History and Physical  HISTORY OF PRESENT ILLNESS:  The patient is a 70 year old patient of Amber Bean and Amber Bean.  The patient was just discharged from the hospital this past Monday. Unfortunately, she had complications from her right and left heart catheterization.  The right and left heart catheterization was done for a history of previous coronary artery bypass surgery and pulmonary hypertension by echocardiogram.  The procedure was done from the right femoral artery and vein.  She had disease which was going to be medically treated and her PA pressures were only in the 30 systolic range at catheterization and postoperatively, she had hypovolemic shock and needed to be rushed to emergent vascular surgery.  As far as I can tell, Amber Bean repaired the common femoral vein and femoral artery.  She has a longstanding history of pernicious anemia and takes B12 shots; she had just given herself a B12 shot today.  She has been feeling fatigued with malaise, burning with urination and has had a low-grade fever; she has also had increasing lower abdominal pain.  She was sent to the ER to be evaluated.  REVIEW OF SYSTEMS:  The patient also reports on review of systems that her stools have been dark.  She has not had any significant vomiting but has felt somewhat nauseated.  There has been no diarrhea.  There have been no rigors and her temperature has been low-grade at 100 degrees.  MEDICATIONS: 1. Bumex 1 mg a day. 2. K-Dur 20 mEq a day. 3. Vitamin B12 injections. 4. Prevacid 30 mg b.i.d. 5. Amitriptyline 50 mg q.h.s. 6. An aspirin  a day. 7. Synthroid 112 mcg a day. 8. Atenolol 50 mg a day.  ALLERGIES:  She is allergic to [facility].   PHYSICAL EXAMINATION:  VITAL SIGNS:  On examination, the blood pressure is 130/80, pulse is 70 and regular.  LUNGS:  Clear.  NECK:  Carotids are normal.  CARDIAC:  There is an S1 and S2 with a systolic ejection murmur.  ABDOMEN:  She is somewhat tender in the right and left lower quadrants but there is no rebound.  There are positive bowel sounds.  She still has clips in the right femoral artery area from her recent surgery.  EXTREMITIES:  Her distal pulses are intact.  There is no edema.  IMPRESSION:  The patient may be suffering residual effects from her large retroperitoneal bleed.  We will do a CT scan of her abdomen and pelvis to further assess for recurrence or any evidence of infection.  Her actual wound site, aside from being somewhat swollen, looks dry and clean and the staples are not particularly erythematous.  We will check her urinalysis and empirically give her intravenous Cipro for her low-grade fever and recent surgery.  We will check a disseminated intravascular coagulation panel as well, as she says that she has had dark stools, and I would want to make sure that she does not have low-grade sepsis syndrome.  Amber Bean will evaluate the  patient later today.  If she has any evidence of fluid collection in her abdomen, we will have surgery see her.  Currently, she does not have any evidence of peritonitis and we will have to see what her hemoglobin is in regards to her pernicious anemia and recent bleed.  From a cardiac standpoint, she is stable but clearly needs further workup after her recent hospitalization with emergency vascular surgery. Dictated by:   Amber Bean, M.D. [facility] Attending Physician:  Amber Bean DD:  08/25/01 TD:  08/29/01 Job: [id number]

## 2010-07-11 NOTE — Discharge Summary (Signed)
[place]  Patient:    Amber Bean, Amber Bean Visit Number: [id number] MRN: [id number]          Service Type: DSU Location: 2000 2002 01 Attending Physician:  Melvenia Needles Dictated by:   Adair Patter, P.A. Admit Date:  08/19/2001 Discharge Date: 08/22/2001                             Discharge Summary  DATE OF BIRTH:  05-26-1940  ADMITTING DIAGNOSIS:  Retroperitoneal hematoma.  DISCHARGE DIAGNOSIS:  Retroperitoneal hematoma.  HOSPITAL COURSE: The patient was admitted to [facility] on August 19, 2001 at which time she underwent an abdominal aortogram.  This was being performed by her cardiologist.  Following this procedure, the patient developed hypovolemic shock.  A CAT scan done secondary to this revealed that the patient had a large retroperitoneal hematoma.  Because of this, Dr. Madilyn Fireman took the patient emergently to the operating room where he performed an exploration of the right groin and repair of the right femoral artery and right common femoral vein.  No complications were noted during the procedure. Postoperatively the vascular status of the patients foot remained intact. Her hemoglobin and hematocrit following this procedure were 10.0 and 30.0, respectively.  She was subsequently deemed stable for discharge home on August 21, 2001.  MEDICATIONS AT THE TIME OF DISCHARGE: 1. Tylox one to two tablets every four to six hours as needed for pain. 2. Resume her home medications which include Bumex 1 mg daily, potassium    chloride 20 mEq one daily, vitamin B12 injection every two weeks, Tricor    160 mg daily, Prevacid 30 mg daily, amitriptyline 50 mg daily, aspirin    81 mg daily, Synthroid 112 mcg daily and Atenolol 50 mg daily.  ACTIVITY:  The patient was told no driving, no strenuous activity or any lifting of heavy objects.  She was told to walk daily.  DIET:  Low fat, low salt.  WOUND CARE:  The patient was told  that she could shower and clean her incision with soap and water.  However, she was told of the importance of keeping her groin incision clean and dry.  DISPOSITION:  Home.  FOLLOW-UP: 1. The patient is told to call her cardiologist for a follow-up appointment. 2. She is also to see Dr. Madilyn Fireman in approximately one week in the office. The    office will call her to verify the time and date of this appointment. Dictated by:   Adair Patter, P.A. Attending Physician:  Melvenia Needles DD:  08/21/01 TD:  08/22/01 Job: [id number]

## 2010-07-11 NOTE — Op Note (Signed)
[place]. [facility]  Patient:    Amber Bean, Amber Bean Visit Number: [id number] MRN: [id number]          Service Type: DSU Location: 2000 2002 01 Attending Physician:  Melvenia Needles Dictated by:   Denman George, M.D. Proc. Date: 08/19/01 Admit Date:  08/19/2001 Discharge Date: 08/22/2001   CC:         Veneda Melter, M.D. [facility]   Operative Report  PREOPERATIVE DIAGNOSIS: 1. Hypovolemic shock. 2. Retroperitoneal hematoma.  POSTOPERATIVE DIAGNOSIS: 1. Hypovolemic shock. 2. Retroperitoneal hematoma.  OPERATION PERFORMED:  Exploration of right groin with repair of right common femoral artery and repair of right common femoral vein.  SURGEON:  Denman George, M.D.  ASSISTANT:  Adair Patter, P.A.  ANESTHESIA:  General endotracheal.  ANESTHESIOLOGIST:  Dr. Randa Evens.  INDICATIONS FOR PROCEDURE:  The patient is a 70 year old female with a history of pulmonary hypertension and peripheral vascular disease.  She underwent a right heart cath earlier today along with a peripheral arteriogram.  Both procedures carried out through the right groin.  Initially, the patient did fine, however, then developed pain in the right thigh and lower quadrant.  She developed hypotension.  CT scan revealed a large retroperitoneal hematoma, occupying the right pelvis, displacing the bladder medially.  Initially resuscitated with intravenous fluids, blood and vasopressors. However, the patient remained unstable and was brought to the operating room at this time for exploration.  DESCRIPTION OF PROCEDURE:  Patient brought to the operating room in unstable hemodynamic condition.  Continued resuscitation with blood, intravenous fluids and vasopressor agents carried out.  General endotracheal anesthesia induced.  The right leg and the abdomen were prepped and draped in sterile fashion.  A longitudinal skin incision made through the right groin.  Subcutaneous  tissue divided with the electrocautery.  Inguinal ligament identified.  Dissection carried down to expose the right common femoral artery and common femoral vein.  There was oozing from the right common femoral vein. This was skeletonized.  The right common femoral vein was then repaired with interrupted 5-0 Prolene pledgeted suture.  This controlled the venous bleeding.  The bleeding from the artery was arising from under the inguinal ligament, the inguinal ligament was mobilized and retracted.  Crossing veins were ligated with 2-0 silk and divided.  The external iliac and common femoral junction was identified.  There was continued oozing from an apparent single stick site which was lying just underneath the inguinal ligament.  The external iliac artery was fully skeletonized.  The artery controlled proximally with a Henley clamp.  The artery repaired with interrupted 5-0 Prolene suture.  The common femoral artery and external iliac artery were skeletonized.  No further evidence of bleeding or damage was identified.  The retroperitoneum was evaluated.  The groin wound was irrigated with antibiotic solution.  The groin wound then closed  with two deep layers of interrupted 2-0 Vicryl suture.  Subcutaneous superficial layer of interrupted 3-0 Vicryl suture.  Staples applied to the skin.  The patient was then transferred to the surgical intensive care unit in stable condition. Dictated by:   Denman George, M.D. Attending Physician:  Melvenia Needles DD:  08/19/01 TD:  08/22/01 Job: [id number]

## 2010-07-11 NOTE — Consult Note (Signed)
NAMEANNELISA, RYBACK              ACCOUNT NO.:  [id number]   MEDICAL RECORD NO.:  [id number]          PATIENT TYPE:  AMB   LOCATION:  DAY                           FACILITY:  [facility]   PHYSICIAN:  Kassie Mends, M.D.      DATE OF BIRTH:  [birth year]   DATE OF CONSULTATION:  03/15/2006  DATE OF DISCHARGE:                                 CONSULTATION   REFERRING PHYSICIAN:  Samuel Jester, MD.   PROBLEM LIST:  1. Pulmonary hypertension.  2. Hypothyroidism.  3. Chronic epigastric abdominal pain.  4. Hypertriglyceridemia.  5. Reported history of chronic pancreatitis.  6. Diarrhea.  7. History of Helicobacter pylori.  8. History of gallstones.  9. History of B12 deficiency.  10.Chronic back pain with a history of degenerative disk disease.   SUBJECTIVE:  Ms. Dagher is a 70 year old female who was first seen in  December 2007.  She came with a reported diagnosis of chronic  pancreatitis and had a complaint of chronic epigastric abdominal pain.  She was given a referral to nutrition for better dietary compliance.  She was also ordered a CT scan of the abdomen.  She declined a CT scan  of the abdomen and pelvis because she did not want to take the  prednisone that was required for her contrast allergy.  She stated she  could not have the MRI of the abdomen because she has a rod in her back.  The abdominal ultrasound was ordered which showed an 8.5 mm echogenic  structure in the distal common bile duct suggestive of stone.  The  pancreas showed no focal mass, and her pancreatic duct with the upper  limit of normal at 2.3 mm.  An endoscopic ultrasound was performed.  Dr.  Christella Hartigan called me and told me she had no evidence of distal common bile  duct stone and only had mild changes of chronic pancreatitis.   She still complains of abdominal pain and diarrhea.  She has less than  10 bowel movements a day.  She states she had three this morning.  Two  to three times a week, she is up in the  middle of the night.  She  reports that she has to wear a pad because she has stool that floods her  panties.  This happens on a daily basis.  This has been going on for  approximately 2 months.  She reports having a colonoscopy in [place],  but I have no record of this.  She also states that she had an upper  endoscopy as well.  She has well water.  She denies any fever,  antibiotic use or travel.  She has not been exposed to C. difficile.  She has not been hospitalized within the last 6 months.  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   MEDICATIONS:  1. She stopped taking her Revatio.  2. Pangestine  3. Omeprazole 20 mg daily.  4. Atenolol.  5. Bumetanide  6. Levothyroxine  7. Potassium chloride.  8. Hyoscyamine 0.125 mg 3 times a day.  9. Premarin.  10.Requip.  11.Ranitidine.  12.Fenofibrate.  13.Amitriptyline.   OBJECTIVE:  VITAL SIGNS: Weight 179 pounds (down 5 pounds since  December2007), height 5 feet 7 inches) Temperature 97.6, blood pressure  120/74, pulse 64.GENERAL:  She is in no apparent distress, alert and  oriented x4.  HEENT: Atraumatic, normocephalic.  Pupils equal, react to  light.  Mouth has no oral lesions. NECK:  Her neck has full range of  motion.  No lymphadenopathy.  LUNGS:  Clear to auscultation bilaterally.  CARDIOVASCULAR:  Shows regular rhythm, no murmur, normal S1-S2.  ABDOMEN:  Bowel sounds present, soft, nondistended, mild tenderness to  palpation in the right upper quadrant. Although she complains of severe  pain, she is in no apparent distress when I press on her right upper  quadrant or epigastrium.  She has no rebound or guarding.  NEUROLOGIC:  She has no focal neurologic deficits.   ASSESSMENT:  Ms. Pagnotta is a 70 year old female with chronic diarrhea  and, according to her report, no workup within the last 5 years.  Her  diarrhea has gotten worse over the last 2 months.  The differential  diagnoses includes acute exacerbation of  her chronic diarrhea.  The  differential diagnoses includes giardiasis, microscopic colitis, celiac  sprue, and acute exacerbation of irritable bowel syndrome.  While she  may have this is a component of pancreatic insufficiency contributing to  her chronic diarrhea, it is hard to explain all of her symptomatology  with the findings on her endoscopic ultrasound.  I would also include in  the differential diagnoses small bowel bacterial overgrowth.   Thank you for allowing me to see Ms. Saefong in consultation.  My  recommendations follow.   RECOMMENDATIONS:  1. Will obtain stool studies for fecal wbc's, Giardia antigen, and C.      difficile toxin.  She will be scheduled for colonoscopy followed by      upper endoscopy.  Biopsies will be obtained of the colon and of the      duodenum.  I will evaluate her for microscopic colitis and celiac      sprue.  The upper endoscopy will also be performed for her      persistent complaint of epigastric pain.  2. She was strongly encouraged to continue her provide Revatio.  She      is asked to contact her pulmonary doctor to discuss discontinuing      that medicine and alternative therapy.  3. She may continue the hyoscyamine.  She should continue the      amitriptyline and the fenofibrate.  4. She should continue the omeprazole 40 mg daily.  5. She has a return patient visit in 1 month.      Kassie Mends, M.D.  Electronically Signed    SM/MEDQ  D:  03/15/2006  T:  03/15/2006  Job:  [id number]   cc:   Samuel Jester  Fax: [phone]

## 2010-07-11 NOTE — Cardiovascular Report (Signed)
[place]. [facility]  Patient:    Amber Bean, Amber Bean Visit Number: [id number] MRN: [id number]          Service Type: MED Location: [phone] Attending Physician:  Ronaldo Miyamoto Dictated by:   Veneda Melter, M.D. Proc. Date: 04/21/01 Admit Date:  04/20/2001   CC:         [name], M.D. [facility]  Delaney Meigs, M.D.   Cardiac Catheterization  PROCEDURES PERFORMED: 1. Left heart catheterization. 2. Left ventriculogram. 3. Selective coronary angiography. 4. Selective angiography, internal mammary and saphenous vein bypass    grafts.  DIAGNOSES: 1. Moderate two-vessel coronary artery disease. 2. Patent bypass grafts. 3. Normal left ventricular systolic function. 4. Mitral regurgitation of 2+.  INDICATIONS: The patient is a 70 year old, white female with multiple cardiac risk factors including a history of tobacco use, who presents with substernal chest discomfort. The patient has previously undergone bypass surgery in 1995 with placement of a LIMA graft to the LAD and vein graft to the left circumflex artery. She has had intermittent episodes of chest discomfort since bypass and has undergone cardiac catheterization in 1999 and in 2000 showing mild native two-vessel coronary artery disease and patency of her bypass grafts. Due to progression of symptoms, she was readmitted to the hospital where she ruled out for acute myocardial infarction and she presents now for relook angiography.  TECHNIQUE: After informed consent was obtained, the patient was brought to the cardiac catheterization lab where a 6 French sheath was placed in the right femoral artery. Left heart catheterization, left ventriculogram, and selective coronary angiography were then performed using preformed 6 French Judkins catheters. At the termination of the case, the catheters and sheath were removed and manual pressure applied until adequate hemostasis was  achieved. The patient tolerated the procedure well and was transferred to the ward in stable condition. She did have some chest discomfort with injection of the native left coronary artery, which resolved spontaneously.  FINDINGS: Findings are as follows: 1. Left main trunk: The left main trunk is a medium caliber vessel with mild    irregularities of 20%. 2. LAD: This is a medium caliber vessel that provides a large first    diagonal branch in the proximal segment. The LAD has moderate tubular    narrowing of 50-70% in the mid section. The distal section fills via    a LIMA graft and has mild irregularities. The first diagonal branch has    mild disease. 3. Left circumflex artery: This is a large caliber vessel that consists of    a large marginal branch in the mid section. There is a mild narrowing of    40-50% in the proximal AV circumflex. The remainder of the vessel has    mild irregularities. 4. Right coronary artery: Dominant. This is a medium caliber vessel that    provides the posterior descending artery and a small posterior ventricular    branch in the terminal segment. There is mild disease of 30% in the    mid section of the right coronary artery. 5. Saphenous vein graft to the marginal branch of the left circumflex artery    is widely patent. 6. LIMA to the distal LAD: Small caliber vessel but is widely patent.  LEFT VENTRICULOGRAM: Normal end-systolic and end-diastolic dimensions. Overall left ventricular function appears well preserved, ejection fraction of greater than 60%. Mitral regurgitation of 2+ is noted. LV pressure is 134/0, aortic is 134/64. LVEDP equals 10.  ASSESSMENT AND PLAN: The patient is a 70 year old female, who appears to have adequate coronary perfusion, well preserved left ventricular function. She has chest discomfort of unclear etiology. Further assessment with stress imaging study may be prudent in the future to rule out coronary cause for  her discomfort. In addition, she has at least 2+ mitral regurgitation which may be under quantitated due to the amount of contrast used to performed ventriculogram. Further assessment with an echocardiogram may be beneficial to determine if this is the cause of her chest discomfort and dyspnea. Dictated by:   Veneda Melter, M.D. Attending Physician:  Ronaldo Miyamoto DD:  04/21/01 TD:  04/22/01 Job: [id number]

## 2010-07-11 NOTE — H&P (Signed)
[place]. [facility]  Patient:    Amber Bean, Amber Bean Visit Number: [id number] MRN: [id number]          Service Type: MED Location: [phone] Attending Physician:  Ronaldo Miyamoto Dictated by:   Guy Franco, P.A. Admit Date:  04/20/2001                           History and Physical  DATE OF BIRTH:  [birth year].  CHIEF COMPLAINT:  Chest pain.  HISTORY OF PRESENT ILLNESS:  The patient is a 70 year old female with known coronary artery disease who underwent CABG around 1995 with the following grafts:  LIMA to the LAD, SVG to the OM.  She now presents with substernal chest pain with minimal activity and this even occurs at rest and during the night.  She had pain all last night and therefore was admitted to [facility] today.  Her EKG reveals sinus bradycardia rate 58 with nonspecific ST-T changes diffusely.  ALLERGIES:  MACRODANTIN.  MEDICATIONS: 1. Bumex 1 mg a day. 2. K-Dur 20 mEq a day. 3. Vitamin B12 q. 2 weeks. 4. Tricor 160 mg a day. 5. Prevacid 30 mg b.i.d. 6. Elavil 50 mg q.h.s. 7. Enteric-coated aspirin 81 mg a day. 8. Synthroid 112 mcg a day. 9. Atenolol 50 mg a day.  FAMILY HISTORY:  Coronary artery disease.  SOCIAL HISTORY:  Lives in [place] and quit smoking years ago.  No regular exercise.  PAST MEDICAL HISTORY: 1. Diabetes mellitus oral dependent. 2. Coronary artery disease status post CABG x 2 1995. 3. History of hypertension and hyperlipidemia. 4. Left catheterization reveals normal ejection fraction.  She did have a cardiac catheterization last by Dr. Riley Kill in June 1999 which revealed a patent LIMA to the LAD and a patent saphenous vein graft to the marginal branch.  Her native vessels:  Left main 30% ostial, LAD with a 70% narrowing just prior to the insertion of the internal mammary.  A 50% proximal circumflex.  Normal LV function.  REVIEW OF SYSTEMS:  As above, otherwise negative.  PHYSICAL  EXAMINATION:  VITAL SIGNS:  Pulse 58, respirations 16, blood pressure 132/84, weight 184.  GENERAL:  No acute distress.  HEENT:  ______ normal.  No carotid bruits.  CHEST:  Clear to auscultation bilaterally.  No wheezing or rhonchi.  HEART:  Regular rate and rhythm.  No gross murmur, rub or ectopy.  ABDOMEN:  Good bowel sounds.  Nontender, nondistended.  No masses.  No bruits. No femoral bruits.  EXTREMITIES:  Lower extremities:  No peripheral edema.  Palpable DP, PT pulses bilaterally.  NEUROLOGIC:  Cranial nerves II-XII grossly intact.  ASSESSMENT AND PLAN: 1. Unstable angina. 2. Known coronary artery disease status post CABG x 2 1995. 3. Diabetes mellitus, diet dependent. 4. Hypothyroidism, treated. 5. Hyperlipidemia, treated.  The patient will be admitted and placed on IV nitroglycerin and IV heparin. Serial enzymes, EKG and will be scheduled for the catheterization lab in the morning. Dictated by:   Guy Franco, P.A. Attending Physician:  Ronaldo Miyamoto DD:  04/20/01 TD:  04/20/01 Job: [id number]

## 2010-07-11 NOTE — Op Note (Signed)
. [facility]  Patient:    Amber Bean, Amber Bean Visit Number: [id number] MRN: [id number]          Service Type: DSU Location: 2000 2002 01 Attending Physician:  Melvenia Needles Dictated by:   Veneda Melter, M.D. Proc. Date: 08/19/01 Admit Date:  08/19/2001 Discharge Date: 08/22/2001   CC:         Delaney Meigs, M.D.  Aundra Dubin. Revankar, M.D.   Operative Report  PROCEDURE: 1. Abdominal aortogram. 2. Bilateral lower extremity angiogram.  DIAGNOSES: 1. Mild peripheral vascular disease by angiogram. 2. Claudication.  PHYSICIANS: 1. Veneda Melter, M.D. 2. Glean Hess R. Revankar, M.D.  HISTORY:  Amber Bean is a 70 year old white female with known history of coronary artery disease who presents with worsening bilateral lower extremity claudication.  The patient underwent ankle brachial indices on May 17, 2001, which were 1.0 on the right and 1.1 on the left.  However, she has had monophasic waveforms by Doppler assessment.  Due to progression of symptoms, she presents for further assessment.  TECHNIQUE:  After informed consent was obtained, the patient was brought to the catheterization lab.  A 5-French sheath was placed in the right femoral artery using the modified Seldinger technique.  Pigtail catheter was advanced to the abdominal aorta and abdominal aortogram performed using power injection of contrast.  Bilateral lower extremity angiogram was then performed using power injection of contrast with a pigtail catheter and digital subtraction angiography.  Further angiograms of the left and right iliac arteries were performed in various projections using power injection of contrast through the pigtail catheter.   At the termination of the case, the pigtail catheter was removed as was the sheath and a vasoseal closure device employed in the right femoral artery until adequate hemostasis was achieved.  The patient tolerated the procedure well and  was transferred to the floor in stable condition.  FINDINGS: 1. Abdominal aorta is of normal caliber.  There is mild atheromatous    buildup without critical stenosis or dilatation.  The renal arteries are    single and widely patent bilaterally. 2. Right lower extremity: The right iliac artery has mild irregularities    as is common femoral artery.  The superficial femoral artery is patent with    mild disease not greater than 30%.  Trifurcation vessels are patent and    extend to the ankle. 3. Left lower extremity: The left iliac artery is a normal caliber vessel with    only mild irregularities.  The common femoral artery and superficial    femoral artery also have mild disease not greater than 20%.  Trifurcation    vessels are patent and extend to the ankle.  ASSESSMENT/PLAN:  Amber Bean is a 70 year old female with claudication but adequate perfusion by angiogram.  Medical therapy will be pursued and the patient encouraged to walk. Dictated by:   Veneda Melter, M.D. Attending Physician:  Melvenia Needles DD:  08/19/01 TD:  08/22/01 Job: [id number]

## 2010-07-11 NOTE — Cardiovascular Report (Signed)
. [facility]  Patient:    Amber Bean, Amber Bean Visit Number: [id number] MRN: [id number]          Service Type: DSU Location: [id number] Attending Physician:  Veneda Melter Dictated by:   Veneda Melter, M.D. [facility] Proc. Date: 08/19/01 Admit Date:  08/19/2001   CC:         Glean Hess R. Revankar, M.D.  Delaney Meigs, M.D.   Cardiac Catheterization  PROCEDURES PERFORMED:  Right heart catheterization.  DIAGNOSIS:  Mild pulmonary hypertension.  HISTORY:  The patient is a 70 year old female who has had progressive dyspnea with exertion.  She has a suggestion of pulmonary hypertension by echocardiogram and she is referred for right heart catheterization.  DESCRIPTION OF PROCEDURE:  Informed consent was obtained.  The patient was brought to the catheterization lab.  An #8 [place] sheath was placed in the right femoral vein.  Pulmonary artery catheter was advanced to the pulmonary artery and appropriate hemodynamics were obtained.  At the termination of the case, the catheters and sheath were removed and manual pressure applied until adequate hemostasis was achieved.  The patient tolerated the procedure well and was transferred to the floor in stable condition.  Findings are as follows:  FINDINGS: 1. RA equals 10/5, mean equals 7. 2. RV equals 33/2. 3. PA equals 29/14. 4. Pulmonary capillary wedge pressure is 19/18, mean equals 17. 5. PA saturation is 69%.  Aortic saturation is 96% on room air.  Hemoglobin is    15.1 mg/dL. 6. Cardiac output is 3.35 liters/minute with a cardiac index of    1.75 liters/minute per sq m by Fick method.  ASSESSMENT AND PLAN:  The patient is a 70 year old female with a history of coronary artery disease who has very mild pulmonary hypertension by right heart catheterization.  She has normal LV function by echocardiogram with slightly reduced output by the Fick method.  Continued medical therapy will  be recommended. Dictated by:   Veneda Melter, M.D. [facility] Attending Physician:  Veneda Melter DD:  08/19/01 TD:  08/21/01 Job: [id number]

## 2010-07-11 NOTE — Discharge Summary (Signed)
[place]. [facility]  Patient:    Amber, Bean Visit Number: [id number] MRN: [id number]          Service Type: MED Location: [phone] Attending Physician:  Ronaldo Miyamoto Dictated by:   Rozell Searing, P.A. Admit Date:  04/20/2001 Discharge Date: 04/22/2001   CC:         Delaney Meigs, M.D.   Referring Physician Discharge Summa  PROCEDURES:  Coronary angiography - April 21, 2001.  REASON FOR ADMISSION:  Please refer to dictated admission note.  LABORATORY DATA:  Cardiac enzymes:  CPK/MB and troponin I markers negative x 3.  Serial CBCs normal.  Sodium 140, potassium 3.9, glucose 120, BUN 12, creatinine 1.0.  Mildly elevated alk phos 38, otherwise normal liver enzymes.  Admission CXR:  NAD.  HOSPITAL COURSE:  Patient is a 70 year old female status post two-vessel CABG in 1995 who presented with symptoms worrisome for unstable angina pectoris. She was placed on intravenous nitroglycerin and heparin and admitted for rule out of myocardial infarction.  Serial cardiac enzymes were negative for MI.  Recommendation was to proceed with diagnostic coronary angiography.  Cardiac catheterization performed on February 27 by Dr. Chales Abrahams (see catheterization report for full details) revealed patent grafts with normal left ventricular function and 2+ mitral regurgitation.  Specifically, there was 50-70% mid LAD prior to the anastomosis with otherwise widely patent LIMA-LAD and SVG-OM grafts.  LV function was normal (EF greater than 60%) with 2+ MR.  Regarding the latter, Dr. Chales Abrahams recommended follow-up outpatient 2-D echocardiogram.  Patient was cleared for discharge the following morning in hemodynamically-stable condition with no noted complications upon examination of the right groin.  No new medication adjustments made this admission.  MEDICATIONS AT DISCHARGE: 1. Atenolol 50 mg q.d. 2. Synthroid 0.112 mg q.d. 3. Prevacid 30 mg  b.i.d. 4. Bumex 1 mg q.d. 5. K-Dur 20 mEq q.d. 6. Tricor 160 mg q.d. 7. Vitamin B12 every two weeks. 8. Elavil 50 mg q.h.s. 9. Coated aspirin 81 mg a day.  INSTRUCTIONS:  ACTIVITY:  No heavy lifting or driving x 2 days.  DIET:  Low fat/cholesterol diet.  WOUND CARE:  Call the office if there is any swelling/bleeding of the groin.  FOLLOW-UP:  Patient is scheduled to follow up with Dr. Denton Lank Serpe, P.A.C. on May 03, 2001 at 10:30 a.m.  She is scheduled for a 2-D echocardiogram earlier that morning at 9:30 a.m.  DISCHARGE DIAGNOSES: 1. Nonischemic chest pain.    a. Normal serial cardiac enzymes.    b. Widely patent grafts - cardiac catheterization April 21, 2001.    c. Normal left ventricular function/2+ mitral regurgitation.    d. Two-vessel coronary artery bypass grafting in 1995 (left internal       mammary artery to left anterior descending artery; saphenous vein graft       to obtuse marginal). 2. Type 2 diabetes mellitus. 3. Dislipidemia. 4. Treated hypothyroidism. 5. History of hypertension. Dictated by:   Rozell Searing, P.A. Attending Physician:  Ronaldo Miyamoto DD:  04/22/01 TD:  04/22/01 Job: [id number]

## 2010-07-11 NOTE — Discharge Summary (Signed)
[place]. [facility]  Patient:    Amber Bean, Amber Bean Visit Number: [id number] MRN: [id number]          Service Type: MED Location: [phone] Attending Physician:  Colon Branch Dictated by:   Lavella Hammock, P.A.-C. Admit Date:  08/25/2001 Disc. Date: 08/26/01   CC:         Amber Bean, M.D.  Veneda Melter, M.D. [facility]  P. Bud Face, M.D.   Referring Physician Discharge Summa  DATE OF BIRTH:  1940-08-06  PROCEDURES:  CT of the abdomen.  HOSPITAL COURSE:  Ms. Amber Bean is a 70 year old female with a history of coronary artery disease, status post aortocoronary bypass surgery, who had a right heart catheterization for pulmonary hypertension on August 19, 2001, and because of a groin hematoma and retroperitoneal bleed, had surgical repair of her right superficial femoral artery and vein by Dr. Madilyn Bean.  She was discharged on August 22, 2001.  On August 25, 2001, the patient presented to the emergency room complaining of severe weakness, ongoing abdominal pain on the right side, and weakness as well as burning and dysuria.  The patient was admitted for further evaluation and treatment.  Because of the burning and dysuria, a urinalysis was done which was negative. The culture is pending at the time of dictation but because of possible UTI, the patient is on antibiotics.  The patient had an abdominal CT done which did not show any extension of the hematoma.  She was seen by Dr. Madilyn Bean in consult, and he felt that antibiotics were indicated and recommended a week of Keflex.  She was started on this. She is to follow up with him next week for staple removal.  The incision did not have any obvious external drainage or erythema.  The patient complained of general malaise and weakness, and her blood pressure was in the 120s and 130s with a heart rate in the 60s-80s.  It was felt that she was possibly slightly dehydrated, although her BUN and  creatinine were within normal limits, so Bumex was held, and her atenolol was held as well to see if the patient improved off this.  She is to hold these medications until she is seen in the office, and her potassium was held as well.  The next day, the patient was feeling much better and not complaining of any significant symptoms except some right-sided abdominal tenderness which Dr. Madilyn Bean felt like would gradually resolved.  Ms. Amber Bean was stable for discharge on August 26, 2001.  LABORATORY VALUES:  Sodium 140, potassium 4.2, chloride 105, CO2 27, BUN 9, creatinine 0.8, glucose 102.  Hemoglobin is 10.6, hematocrit 31.4, WBC 9.3, platelets 396.  D-dimer 2.19.  Fibrinogen elevated at 639.  INR 0.8, PTT 30. Urine culture, TSH pending at the time of dictation.  LFTs within normal limits except mildly elevated SGPT at 44 and mildly decreased albumin at 3.3. Urinalysis negative.  CT of the abdomen and chest:  There is a hyperdense exophytic lesion in the lower pole of the left kidney which probably represents a proteinaceous cyst. No change from the prior exam of August 19, 2001.  There is no evidence of free fluid or abscess or other acute abnormality of the abdomen.  No discrete evidence of an abscess or other findings suggestive of infection.  Right pelvic retroperitoneal hematoma is smaller than on prior study but still has a mass effect on the bladder.  The fluid in the soft tissues  at the operative site and the air in the subcu tissues of the anterior abdominal wall just above the incision are most likely postoperative in origin.  DISCHARGE CONDITION:  Stable.  CONSULTS:  Dr. Madilyn Bean.  DISCHARGE DIAGNOSES:  1. Retroperitoneal hematoma, status post surgical repair of the right     superficial femoral artery and vein per [facility].  2. Pulmonary hypertension.  3. Hypertension.  4. Status post aortocoronary bypass surgery in 1995, with left internal     mammary  artery to left anterior descending and saphenous vein graft of the     left circumflex artery.  5. Status post cardiac catheterization in February 2003, showing an ejection     fraction of greater than 60% with 2+ mitral regurgitation and continued     patency of the bypass grafts with native two-vessel coronary artery     disease and no significant disease in the right coronary artery.  6. ALLERGY: MACRODANTIN.  7. Family history of coronary artery disease.  8. Hypothyroidism.  9. Possible urinary tract infection. 10. Possible history of diet-controlled diabetes mellitus. 11. ALLERGIC REACTION to CIPRO this admission. 12. Hyperlipidemia.  DISCHARGE INSTRUCTIONS: 1. She is to take Tylenol or Tylox as needed for pain. 2. Her activity level is to be as tolerated. 3. She is to stick to a low fat diabetic diet. 4. She is to get her staples out next week, and [facility] will call her. 5. She is to keep her September 14, 2001 appointment with Dr. Chales Abrahams. 6. She is to follow up with Dr. Lysbeth Galas as scheduled on September 02, 2001. 7. She is to follow up with the sleep study as scheduled.  DISCHARGE MEDICATIONS 1. Atenolol, Bumex, and Kay Ciel are on hold for now. 2. Iron 325 mg t.i.d. x 1 week. 3. Keflex 250 mg q.i.d. x 1 week. 4. Prevacid 30 mg q.d. 5. Elavil 50 mg q.h.s. 6. Synthroid 112 mcg q.d. 7. Tricor 160 mg q.d. 8. Aspirin 81 mg q.d. Dictated by:   Lavella Hammock, P.A.-C. Attending Physician:  Colon Branch DD:  08/26/01 TD:  08/26/01 Job: [id number]

## 2010-07-15 ENCOUNTER — Other Ambulatory Visit: Payer: Self-pay | Admitting: Internal Medicine

## 2010-07-24 ENCOUNTER — Encounter: Payer: Self-pay | Admitting: Internal Medicine

## 2010-07-24 ENCOUNTER — Encounter: Payer: Self-pay | Admitting: Cardiology

## 2010-08-01 ENCOUNTER — Ambulatory Visit (INDEPENDENT_AMBULATORY_CARE_PROVIDER_SITE_OTHER): Payer: Medicare Other | Admitting: Internal Medicine

## 2010-08-01 ENCOUNTER — Encounter: Payer: Self-pay | Admitting: Internal Medicine

## 2010-08-01 DIAGNOSIS — M549 Dorsalgia, unspecified: Secondary | ICD-10-CM

## 2010-08-01 DIAGNOSIS — E039 Hypothyroidism, unspecified: Secondary | ICD-10-CM

## 2010-08-01 DIAGNOSIS — K219 Gastro-esophageal reflux disease without esophagitis: Secondary | ICD-10-CM

## 2010-08-01 DIAGNOSIS — G4730 Sleep apnea, unspecified: Secondary | ICD-10-CM

## 2010-08-01 MED ORDER — OMEPRAZOLE 20 MG PO CPDR: 40.0000 mg | DELAYED_RELEASE_CAPSULE | Freq: Every day | ORAL | Status: DC

## 2010-08-01 MED ORDER — LEVOTHYROXINE SODIUM 112 MCG PO TABS: 112.0000 ug | ORAL_TABLET | Freq: Every day | ORAL | Status: DC

## 2010-08-01 MED ORDER — AMITRIPTYLINE HCL 50 MG PO TABS: 50.0000 mg | ORAL_TABLET | Freq: Every evening | ORAL | Status: DC | PRN

## 2010-08-01 NOTE — Assessment & Plan Note (Signed)
Dysphagia and GERD symptoms resolved with resumed PPI 03/2010 - cont same - refill today Declined GI f/u due to symptoms resolution

## 2010-08-01 NOTE — Patient Instructions (Signed)
It was good to see you today. We have reviewed your prior records including labs and tests today Medications reviewed, no changes at this time. Please schedule followup in 6-12 months, call sooner if problems.

## 2010-08-01 NOTE — Assessment & Plan Note (Signed)
Meds/mgmt per Ethelene Hal - The current medical regimen is effective;  continue present plan and medications.  Refill elavil for sleep

## 2010-08-01 NOTE — Assessment & Plan Note (Signed)
The current medical regimen is effective;  continue present plan and medications. Lab Results  Component Value Date   TSH 1.79 04/03/2010

## 2010-08-01 NOTE — Assessment & Plan Note (Signed)
Noncompliant with CPAP 

## 2010-08-01 NOTE — Progress Notes (Signed)
Subjective:    Patient ID: Amber Bean, female    DOB: 1940/12/03, 70 y.o.   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 denies adverse side effects related to current therapy. no weight, skin or bowel changes   DM2 hx - reports no longer dm pt since losing 50# - does not check sugars - does not take meds -   GERD - resumed PPI 03/2010 with resolution of burning and dysphagia -   Past Medical History  Diagnosis Date  . CAD (coronary artery disease)     S/P CABG in 1995. Last cath in 2003 demonstrated left main 20% stenosis, LAD 50-70% mid stenosis, circumflex 40-50% stenosis in teh AV groove, 30% right coronary artery stenosis. Saphenous vein graft to the marginal was patenet. A LIMA to the LAD was patent.  . Pseudoaneurysm     Requiring repair following last cath  . Obstructive sleep apnea on CPAP   . Pancreatitis   . Tobacco abuse     Quit 02/2010  . Osteopenia   . BACK PAIN, CHRONIC   . CAROTID ARTERY STENOSIS   . Mitral valve disorders   . SLEEP APNEA   . Anemia, iron deficiency   . Anemia, vitamin B12 deficiency   . Hypothyroidism   . GERD (gastroesophageal reflux disease)   . Diabetes mellitus     Diet controlled since weight loss     Review of Systems  Constitutional: Negative for fatigue and unexpected weight change.  Respiratory: Negative for cough and shortness of breath.   Cardiovascular: Negative for chest pain.  Genitourinary: Positive for frequency.    Neurological: Negative for weakness.       Objective:   Physical Exam BP 130/72  Pulse 64  Temp(Src) 97.4 F (36.3 C) (Oral)  Ht 5\' 7"  (1.702 m)  Wt 166 lb 1.9 oz (75.352 kg)  BMI 26.02 kg/m2  SpO2 98% Physical Exam  Constitutional: She is oriented to person, place, and time. She appears well-developed and well-nourished. No distress.  Eyes: Conjunctivae and EOM are normal. Pupils are equal, round, and reactive to light. No scleral icterus.  Neck: Normal range of motion. Neck supple. No JVD present. No thyromegaly present.  Cardiovascular: Normal rate, regular rhythm and normal heart sounds.  No murmur heard. No BLE edema. Pulmonary/Chest: Effort normal and breath sounds normal. No respiratory distress. She has no wheezes.  Psychiatric: She has a normal mood and affect. Her behavior is normal. Judgment and thought content normal.   Lab Results  Component Value Date   WBC 7.8 04/03/2010   HGB 13.8 04/03/2010   HCT 39.3 04/03/2010   PLT 260.0 04/03/2010   CHOL 181 05/04/2007   HDL 40 05/04/2007   ALT 15 11/07/7827   AST 23 06/07/2009   NA 139 06/07/2009   K 3.9 06/07/2009   CL 100 06/07/2009   CREATININE 0.72 06/07/2009   BUN 12 06/07/2009   CO2 28 06/07/2009  TSH 1.79 04/03/2010   HGBA1C 5.3 03/01/2009       Assessment & Plan:  See problem list. Medications and labs reviewed today. Time spent with pt today 25 minutes, greater than 50% time spent counseling patient on back pain, thyroid, GERD and medication review. Also review of prior records   L kindey mass - stablke on serial [place] - reminded of need to keep [place] follow up as scheduled 08/2010 CAD - follow up cards annually as planned - no CP/anginal symptoms   ?bladder prolapse - contrib to freq - no burning - pt declines eval by gyn/uro at this time - will call if she changes her mind

## 2010-08-06 ENCOUNTER — Ambulatory Visit (INDEPENDENT_AMBULATORY_CARE_PROVIDER_SITE_OTHER): Payer: Medicare Other | Admitting: Cardiology

## 2010-08-06 ENCOUNTER — Encounter: Payer: Self-pay | Admitting: Cardiology

## 2010-08-06 DIAGNOSIS — I2510 Atherosclerotic heart disease of native coronary artery without angina pectoris: Secondary | ICD-10-CM

## 2010-08-06 DIAGNOSIS — E785 Hyperlipidemia, unspecified: Secondary | ICD-10-CM

## 2010-08-06 DIAGNOSIS — I6529 Occlusion and stenosis of unspecified carotid artery: Secondary | ICD-10-CM

## 2010-08-06 DIAGNOSIS — I059 Rheumatic mitral valve disease, unspecified: Secondary | ICD-10-CM

## 2010-08-06 DIAGNOSIS — F17200 Nicotine dependence, unspecified, uncomplicated: Secondary | ICD-10-CM

## 2010-08-06 NOTE — Progress Notes (Signed)
HPI The patient presents for followup of her known coronary disease. Since I last saw her she has had no cardiovascular complaints. She tries to walk daily. With this level of activity she denies any chest pressure, neck or arm discomfort. She denies any palpitations, presyncope or syncope. She hasn't shortness of breath, weight gain. She does have some mild bilateral lower extremity edema. She has stopped smoking again. She is sleeping without PND or orthopnea.  Allergies  Allergen Reactions  . Ciprofloxacin   . Morphine   . Nitrofurantoin     Current Outpatient Prescriptions  Medication Sig Dispense Refill  . amitriptyline (ELAVIL) 50 MG tablet Take 1 tablet (50 mg total) by mouth at bedtime as needed for sleep.  60 tablet  5  . aspirin 325 MG tablet Take 325 mg by mouth as needed.        Marland Kitchen atenolol (TENORMIN) 50 MG tablet TAKE 1 TABLET EVERY DAY  30 tablet  10  . bumetanide (BUMEX) 0.5 MG tablet Take 0.5 mg by mouth daily as needed.        . cyanocobalamin (,VITAMIN B-12,) 1000 MCG/ML injection Inject 1,000 mcg into the muscle every 14 (fourteen) days.        Marland Kitchen estrogens, conjugated, (PREMARIN) 0.625 MG tablet Take 0.625 mg by mouth daily. Take daily for 21 days then do not take for 7 days.       Marland Kitchen K-DUR 20 MEQ tablet TAKE 1 TABLET TWICE DAILY.  60 each  2  . levothyroxine (SYNTHROID) 112 MCG tablet Take 1 tablet (112 mcg total) by mouth daily.  30 tablet  6  . meclizine (ANTIVERT) 25 MG tablet Take 25 mg by mouth as needed.        . nitroGLYCERIN (NITROSTAT) 0.4 MG SL tablet Place 0.4 mg under the tongue as needed.        Marland Kitchen omeprazole (PRILOSEC) 20 MG capsule Take 2 capsules (40 mg total) by mouth daily.  60 capsule  6  . oxyCODONE (OXYCONTIN) 10 MG 12 hr tablet Take 10 mg by mouth every 12 (twelve) hours as needed.          Past Medical History  Diagnosis Date  . CAD (coronary artery disease)     S/P CABG in 1995. Last cath in 2003 demonstrated left main 20% stenosis, LAD 50-70% mid  stenosis, circumflex 40-50% stenosis in teh AV groove, 30% right coronary artery stenosis. Saphenous vein graft to the marginal was patenet. A LIMA to the LAD was patent.  . Pseudoaneurysm     Requiring repair following last cath  . Obstructive sleep apnea on CPAP     intol of CPAP - noncompliant  . Pancreatitis   . Tobacco abuse     Quit 02/2010  . Osteopenia   . BACK PAIN, CHRONIC   . CAROTID ARTERY STENOSIS   . Mitral valve disorders   . Anemia, iron deficiency   . Anemia, vitamin B12 deficiency   . Hypothyroidism   . GERD (gastroesophageal reflux disease)   . Diabetes mellitus     Diet controlled since weight loss    Past Surgical History  Procedure Date  . Coronary artery bypass graft [place]  . Cardiac catheterization 2003  . Saphenous vein graft resection 2003  . Appendectomy   . Abdominal hysterectomy   . Lumbar fusion   . Breast lumpectomy   . Tonsillectomy     ROS:  As stated in the HPI and  negative for all other systems.  PHYSICAL EXAM BP 164/78  Pulse 75  Resp 16  Ht 5\' 7"  (1.702 m)  Wt 178 lb (80.74 kg)  BMI 27.88 kg/m2 GENERAL:  Well appearing HEENT:  Pupils equal round and reactive, fundi not visualized, oral mucosa unremarkable NECK:  No jugular venous distention, waveform within normal limits, carotid upstroke brisk and symmetric, no bruits, no thyromegaly LYMPHATICS:  No cervical, inguinal adenopathy LUNGS:  Clear to auscultation bilaterally BACK:  No CVA tenderness CHEST:  Well healed sternotomy scar. HEART:  PMI not displaced or sustained,S1 and S2 within normal limits, no S3, no S4, no clicks, no rubs, apical holosystolic murmur ABD:  Flat, positive bowel sounds normal in frequency in pitch, no bruits, no rebound, no guarding, no midline pulsatile mass, no hepatomegaly, no splenomegaly EXT:  2 plus pulses throughout, bilateral mild lower extremity edema, no cyanosis no clubbing SKIN:  No rashes no nodules NEURO:  Cranial nerves  II through XII grossly intact, motor grossly intact throughout PSYCH:  Cognitively intact, oriented to person place and time   EKG:  Sinus rhythm, rate 75, axis leftward  intervals within normal limits, no acute ST-T wave changes.   ASSESSMENT AND PLAN

## 2010-08-06 NOTE — Assessment & Plan Note (Signed)
I will check a lipid profile the goal LDL less than 100 and HDL greater than 50.

## 2010-08-06 NOTE — Patient Instructions (Signed)
You are being scheduled for a treadmill test.  Please follow the instruction sheet given. Please have a fasting lipid panel drawn the same day as your treadmill test. Please continue your current medications.

## 2010-08-06 NOTE — Assessment & Plan Note (Signed)
She is doing well and I would like to screen her with an exercise treadmill test. I will bring the patient back for a POET (Plain Old Exercise Test). This will allow me to screen for obstructive coronary disease, risk stratify and very importantly provide a prescription for exercise.

## 2010-08-06 NOTE — Assessment & Plan Note (Signed)
She had mild to mod MR last year.  I will follow this with clinical exams and consider repeat echo next year.

## 2010-08-06 NOTE — Assessment & Plan Note (Signed)
I applauded the fact that she quit smoking and encouraged complete abstinence.

## 2010-08-06 NOTE — Assessment & Plan Note (Signed)
She had minimal plaque last year and no further testing is indicated at this point.

## 2010-08-21 ENCOUNTER — Encounter: Payer: Medicare Other | Admitting: Cardiology

## 2010-08-21 ENCOUNTER — Other Ambulatory Visit: Payer: Medicare Other | Admitting: *Deleted

## 2010-10-24 ENCOUNTER — Encounter: Payer: Medicare Other | Admitting: Cardiology

## 2010-11-10 ENCOUNTER — Other Ambulatory Visit: Payer: Self-pay | Admitting: Internal Medicine

## 2010-11-11 ENCOUNTER — Encounter: Payer: Medicare Other | Admitting: Cardiology

## 2010-11-11 ENCOUNTER — Ambulatory Visit (INDEPENDENT_AMBULATORY_CARE_PROVIDER_SITE_OTHER): Payer: Medicare Other | Admitting: Cardiology

## 2010-11-11 DIAGNOSIS — R079 Chest pain, unspecified: Secondary | ICD-10-CM

## 2010-11-11 NOTE — Progress Notes (Signed)
Exercise Treadmill Test  Pre-Exercise Testing Evaluation Rhythm: normal sinus  Rate: 63   PR:  .15 QRS:  .08  QT:  .41 QTc: .42     Test  Exercise Tolerance Test Ordering MD: Angelina Sheriff, MD  Interpreting MD:  Angelina Sheriff, MD  Unique Test No: 1  Treadmill:  1  Indication for ETT: chest pain - rule out ischemia  Contraindication to ETT: No   Stress Modality: exercise - treadmill  Cardiac Imaging Performed: non   Protocol: manual protocol Max BP:  206/105  Max MPHR (bpm):  152 85% MPR (bpm):  129  MPHR obtained (bpm):  106 % MPHR obtained:  75  Reached 85% MPHR (min:sec):  NA Total Exercise Time (min-sec): 9:30  Workload in METS:  7.0 Borg Scale: 13  Reason ETT Terminated:  dyspnea    ST Segment Analysis At Rest: normal ST segments - no evidence of significant ST depression With Exercise: no evidence of significant ST depression  Other Information Arrhythmia:  No Angina during ETT:  absent (0) Quality of ETT:  non-diagnostic  ETT Interpretation:  normal - no evidence of ischemia by ST analysis  Comments: The patient had a reasonable exercise tolerance for her situation.  I used a modified manual protocol.  She did not reach her target heart rate but this was probably secondary to the fact that I kept her on her beta blocker for this test.  At the heart rate she achieved at a reasonable level of exertion she had no chest pain and no ischemic ST T wave changes  Recommendations: The patient will continue with current therapies.  Based on this study she was given a prescription for exercise.

## 2010-12-03 ENCOUNTER — Encounter: Payer: Self-pay | Admitting: Internal Medicine

## 2011-01-10 ENCOUNTER — Other Ambulatory Visit: Payer: Self-pay | Admitting: Internal Medicine

## 2011-02-09 ENCOUNTER — Other Ambulatory Visit: Payer: Self-pay | Admitting: Internal Medicine

## 2011-03-10 ENCOUNTER — Other Ambulatory Visit: Payer: Self-pay | Admitting: Internal Medicine

## 2011-03-30 ENCOUNTER — Telehealth: Payer: Self-pay | Admitting: Cardiology

## 2011-03-30 NOTE — Telephone Encounter (Signed)
PT CALLING FOR RESULTS OF SLEEP TEST

## 2011-03-30 NOTE — Telephone Encounter (Signed)
Pt wants to know the results of her sleep study from one month ago.  I could not find results. Informed pt that I will forward request to Dr. Jenene Slicker nurse for review.  Pt states she wants a new c-pap machine, she has a used model from a smoker and it stinks.

## 2011-04-02 NOTE — Telephone Encounter (Signed)
I don't see any order appt or results for a sleep study on this pt.  Will continue to attempt to locate.  She would need to call pulmonary about having her CPAP replaced.

## 2011-04-10 NOTE — Telephone Encounter (Signed)
Pt states the sleep study equipment was sent to her home and they told her they would send the results to her MD.  Instructed pt that we haven't received any thing and she should call the company to check where they sent it.  She is in agreement

## 2011-04-10 NOTE — Telephone Encounter (Signed)
Left message for pt to call back  °

## 2011-04-20 ENCOUNTER — Telehealth: Payer: Self-pay | Admitting: *Deleted

## 2011-04-20 NOTE — Telephone Encounter (Signed)
Pt called and left message stating that she is changing physicians and that she needs records sent to new physician. Pt states that they have mailed records to new office but that they have not received them. Left message for pt with information/contact # for [facility] to contact them regarding request.

## 2011-05-07 ENCOUNTER — Other Ambulatory Visit: Payer: Self-pay | Admitting: Internal Medicine

## 2011-08-10 ENCOUNTER — Other Ambulatory Visit: Payer: Self-pay | Admitting: Internal Medicine

## 2011-09-09 ENCOUNTER — Encounter: Payer: Self-pay | Admitting: Cardiology

## 2011-09-09 ENCOUNTER — Other Ambulatory Visit: Payer: Self-pay | Admitting: Internal Medicine

## 2011-09-09 ENCOUNTER — Ambulatory Visit (INDEPENDENT_AMBULATORY_CARE_PROVIDER_SITE_OTHER): Payer: Medicare Other | Admitting: Cardiology

## 2011-09-09 VITALS — BP 140/90 | HR 85 | Ht 67.0 in | Wt 186.0 lb

## 2011-09-09 DIAGNOSIS — E785 Hyperlipidemia, unspecified: Secondary | ICD-10-CM

## 2011-09-09 DIAGNOSIS — I6529 Occlusion and stenosis of unspecified carotid artery: Secondary | ICD-10-CM

## 2011-09-09 DIAGNOSIS — I2510 Atherosclerotic heart disease of native coronary artery without angina pectoris: Secondary | ICD-10-CM

## 2011-09-09 DIAGNOSIS — I059 Rheumatic mitral valve disease, unspecified: Secondary | ICD-10-CM

## 2011-09-09 DIAGNOSIS — F17200 Nicotine dependence, unspecified, uncomplicated: Secondary | ICD-10-CM

## 2011-09-09 MED ORDER — ATENOLOL 50 MG PO TABS: 50.0000 mg | ORAL_TABLET | Freq: Two times a day (BID) | ORAL | Status: DC

## 2011-09-09 NOTE — Progress Notes (Signed)
HPI The patient presents for followup of her known coronary disease.   Since I last saw her she started having some arm discomfort. This is sporadic areas she's had some episodes at rest waking her in her sleep. She's not sure whether it was like previous angina. She doesn't describe diaphoresis although she has hot flashes not associated with this discomfort. She doesn't think she has acute shortness of breath. She doesn't describe PND or orthopnea. She's had some weight gain since she stopped smoking. She's had some lower extremity swelling. She's not describing any chest pressure or neck discomfort. She doesn't bring this on with activity though she does not exert herself much. She walks her dog.  Allergies  Allergen Reactions  . Ciprofloxacin   . Morphine   . Nitrofurantoin     Current Outpatient Prescriptions  Medication Sig Dispense Refill  . aspirin 325 MG tablet Take 325 mg by mouth as needed.        Marland Kitchen atenolol (TENORMIN) 50 MG tablet       . bumetanide (BUMEX) 0.5 MG tablet Take 0.5 mg by mouth daily as needed.        . cyanocobalamin (,VITAMIN B-12,) 1000 MCG/ML injection Inject 1,000 mcg into the muscle every 14 (fourteen) days.        Marland Kitchen ELAVIL 50 MG tablet TAKE 2 TABLETS AT BEDTIME  60 each  1  . nitroGLYCERIN (NITROSTAT) 0.4 MG SL tablet Place 0.4 mg under the tongue as needed.        Marland Kitchen oxyCODONE (OXYCONTIN) 10 MG 12 hr tablet Take 10 mg by mouth every 12 (twelve) hours as needed.        . potassium chloride SA (K-DUR) 20 MEQ tablet       . PRILOSEC 20 MG capsule TAKE (2) CAPSULES DAILY.  60 each  1  . SYNTHROID 112 MCG tablet TAKE 1 TABLET DAILY  30 each  1  . DISCONTD: atenolol (TENORMIN) 50 MG tablet TAKE 1 TABLET EVERY DAY  30 tablet  10  . DISCONTD: K-DUR 20 MEQ tablet TAKE  (1)  TABLET TWICE A DAY.  60 each  5  . meclizine (ANTIVERT) 25 MG tablet Take 25 mg by mouth as needed.        Marland Kitchen DISCONTD: PRILOSEC 20 MG capsule TAKE (2) CAPSULES DAILY.  60 each  5    Past  Medical History  Diagnosis Date  . CAD (coronary artery disease)     S/P CABG in 1995. Last cath in 2003 demonstrated left main 20% stenosis, LAD 50-70% mid stenosis, circumflex 40-50% stenosis in teh AV groove, 30% right coronary artery stenosis. Saphenous vein graft to the marginal was patenet. A LIMA to the LAD was patent.  . Pseudoaneurysm     Requiring repair following last cath  . Obstructive sleep apnea on CPAP     intol of CPAP - noncompliant  . Pancreatitis   . Tobacco abuse     Quit 02/2010  . Osteopenia   . BACK PAIN, CHRONIC   . CAROTID ARTERY STENOSIS   . Mitral valve disorders   . Anemia, iron deficiency   . Anemia, vitamin B12 deficiency   . Hypothyroidism   . GERD (gastroesophageal reflux disease)   . Diabetes mellitus     Diet controlled since weight loss    Past Surgical History  Procedure Date  . Coronary artery bypass graft [place], [place]  . Cardiac catheterization 2003  . Saphenous  vein graft resection 2003  . Appendectomy   . Abdominal hysterectomy   . Lumbar fusion   . Breast lumpectomy   . Tonsillectomy     ROS:  As stated in the HPI and negative for all other systems.  PHYSICAL EXAM BP 140/90  Pulse 85  Ht 5\' 7"  (1.702 m)  Wt 186 lb (84.369 kg)  BMI 29.13 kg/m2 GENERAL:  Well appearing HEENT:  Pupils equal round and reactive, fundi not visualized, oral mucosa unremarkable NECK:  No jugular venous distention, waveform within normal limits, carotid upstroke brisk and symmetric, no bruits, no thyromegaly LYMPHATICS:  No cervical, inguinal adenopathy LUNGS:  Clear to auscultation bilaterally BACK:  No CVA tenderness CHEST:  Well healed sternotomy scar. HEART:  PMI not displaced or sustained,S1 and S2 within normal limits, no S3, no S4, no clicks, no rubs, apical holosystolic murmur ABD:  Flat, positive bowel sounds normal in frequency in pitch, no bruits, no rebound, no guarding, no midline pulsatile mass, no hepatomegaly, no  splenomegaly EXT:  2 plus pulses throughout, bilateral mild lower extremity edema, no cyanosis no clubbing SKIN:  No rashes no nodules NEURO:  Cranial nerves II through XII grossly intact, motor grossly intact throughout PSYCH:  Cognitively intact, oriented to person place and time   EKG:  Sinus rhythm, rate 85, axis leftward  intervals within normal limits, no acute ST-T wave changes.  09/09/2011   ASSESSMENT AND PLAN  CAD -  She says she would not want another catheterization but would consider it if her life was in danger. She will consent to a stress test. She has chronic back pain and this is worse. I don't think she did walk a treadmill so she will have a [employer].   TOBACCO ABUSE -  I applauded the fact that she is still not smoking.   CAROTID ARTERY STENOSIS -  She had minimal plaque two years ago and no further testing is indicated at this point.   MITRAL VALVE DISORDERS -  She had mild to mod MR last year. I will schedule an echo for follow up of this.   Hyperlipidemia -  I will defer to her primary provider with a goal LDL less than 100 and HDL greater than 40.    Hypertension -  The blood pressure is at target. No change in medications is indicated. We will continue with therapeutic lifestyle changes (TLC).

## 2011-09-09 NOTE — Patient Instructions (Addendum)
Please take Atenolol 50 mg a day Continue all other medications as listed.  Your physician has requested that you have a lexiscan myoview. For further information please visit [email or web address] Please follow instruction sheet, as given.

## 2011-09-17 ENCOUNTER — Ambulatory Visit (HOSPITAL_COMMUNITY): Payer: Medicare Other | Attending: Cardiology

## 2011-09-17 ENCOUNTER — Ambulatory Visit (HOSPITAL_COMMUNITY): Payer: Medicare Other | Attending: Cardiology | Admitting: Radiology

## 2011-09-17 VITALS — BP 140/83 | HR 82 | Ht 67.0 in | Wt 186.0 lb

## 2011-09-17 DIAGNOSIS — E785 Hyperlipidemia, unspecified: Secondary | ICD-10-CM | POA: Insufficient documentation

## 2011-09-17 DIAGNOSIS — E119 Type 2 diabetes mellitus without complications: Secondary | ICD-10-CM | POA: Insufficient documentation

## 2011-09-17 DIAGNOSIS — I4949 Other premature depolarization: Secondary | ICD-10-CM

## 2011-09-17 DIAGNOSIS — I079 Rheumatic tricuspid valve disease, unspecified: Secondary | ICD-10-CM | POA: Insufficient documentation

## 2011-09-17 DIAGNOSIS — I059 Rheumatic mitral valve disease, unspecified: Secondary | ICD-10-CM

## 2011-09-17 DIAGNOSIS — I2510 Atherosclerotic heart disease of native coronary artery without angina pectoris: Secondary | ICD-10-CM

## 2011-09-17 DIAGNOSIS — R079 Chest pain, unspecified: Secondary | ICD-10-CM

## 2011-09-17 DIAGNOSIS — I080 Rheumatic disorders of both mitral and aortic valves: Secondary | ICD-10-CM | POA: Insufficient documentation

## 2011-09-17 MED ORDER — REGADENOSON 0.4 MG/5ML IV SOLN
0.4000 mg | Freq: Once | INTRAVENOUS | Status: AC
  Administered 2011-09-17: 0.4 mg via INTRAVENOUS

## 2011-09-17 MED ORDER — TECHNETIUM TC 99M TETROFOSMIN IV KIT
11.0000 | PACK | Freq: Once | INTRAVENOUS | Status: AC | PRN
  Administered 2011-09-17: 11 via INTRAVENOUS

## 2011-09-17 MED ORDER — TECHNETIUM TC 99M TETROFOSMIN IV KIT
33.0000 | PACK | Freq: Once | INTRAVENOUS | Status: AC | PRN
  Administered 2011-09-17: 33 via INTRAVENOUS

## 2011-09-17 NOTE — Progress Notes (Signed)
Echocardiogram performed.  

## 2011-09-17 NOTE — Progress Notes (Signed)
[facility] SITE 3 NUCLEAR MED [address] [place] [address] [phone]  Cardiology Nuclear Med Study  Amber Bean is a 71 y.o. female     MRN : [id number]     DOB: 20-Apr-1940  Procedure Date: 09/17/2011  Nuclear Med Background Indication for Stress Test:  Evaluation for Ischemia and Graft Patency History:  '95 CABG; '03 Cath:Patent Grafts, N/O CAD; '09 MWU:XLKGMW; '12 NUU:VOZDGU. Cardiac Risk Factors: Carotid Disease, Family History - CAD, History of Smoking, Lipids and NIDDM  Symptoms:  Chest Pressure and Left Arm Discomfort, Diaphoresis, DOE, Fatigue, Nausea, Palpitations and Rapid HR   Nuclear Pre-Procedure Caffeine/Decaff Intake:  None NPO After: 4AM   Lungs:  Clear. O2 Sat: 98% on room air. IV 0.9% NS with Angio Cath:  22g  IV Site: R Hand  IV Started by:  Bonnita Levan, RN  Chest Size (in):  40 Cup Size: D  Height: 5\' 7"  (1.702 m)  Weight:  186 lb (84.369 kg)  BMI:  Body mass index is 29.13 kg/(m^2). Tech Comments:  Patient took Atenolol this AM    Nuclear Med Study 1 or 2 day study: 1 day  Stress Test Type:  Eugenie Birks  Reading MD: Marca Ancona, MD  Order Authorizing Provider:  Rollene Rotunda, MD  Resting Radionuclide: Technetium 67m Tetrofosmin  Resting Radionuclide Dose: 11.0 mCi   Stress Radionuclide:  Technetium 30m Tetrofosmin  Stress Radionuclide Dose: 33.0 mCi           Stress Protocol Rest HR: 82 Stress HR: 93  Rest BP: 140/83 Stress BP: 161/77  Exercise Time (min): n/a METS: n/a   Predicted Max HR: 151 bpm % Max HR: 61.59 bpm Rate Pressure Product: 44034   Dose of Adenosine (mg):  n/a Dose of Lexiscan: 0.4 mg  Dose of Atropine (mg): n/a Dose of Dobutamine: n/a mcg/kg/min (at max HR)  Stress Test Technologist: Smiley Houseman, CMA-N  Nuclear Technologist:  Domenic Polite, CNMT     Rest Procedure:  Myocardial perfusion imaging was performed at rest 45 minutes following the intravenous administration of Technetium 45m  Tetrofosmin.  Rest ECG: No acute changes, rare PAC's noted.  Stress Procedure:  The patient received IV Lexiscan 0.4 mg over 15-seconds.  Technetium 90m Tetrofosmin injected at 30-seconds.  There were no significant changes with Lexiscan.  Quantitative spect images were obtained after a 45 minute delay.  Stress ECG: No significant change from baseline ECG  QPS Raw Data Images:  Normal; no motion artifact; normal heart/lung ratio. Stress Images:  Normal homogeneous uptake in all areas of the myocardium. Rest Images:  Normal homogeneous uptake in all areas of the myocardium. Subtraction (SDS):  There is no evidence of scar or ischemia. Transient Ischemic Dilatation (Normal <1.22):  1.07 Lung/Heart Ratio (Normal <0.45):  0.40  Quantitative Gated Spect Images QGS EDV:  65 ml QGS ESV:  15 ml  Impression Exercise Capacity:  Lexiscan with no exercise. BP Response:  Normal blood pressure response. Clinical Symptoms:  Short of breath, throat tightness.  ECG Impression:  No significant ST segment change suggestive of ischemia. Comparison with Prior Nuclear Study: No images to compare  Overall Impression:  Normal stress nuclear study.  LV Ejection Fraction: 77%.  LV Wall Motion:  NL LV Function; NL Wall Motion  Marca Ancona 09/17/2011

## 2011-10-09 ENCOUNTER — Other Ambulatory Visit: Payer: Self-pay | Admitting: Internal Medicine

## 2011-11-09 ENCOUNTER — Other Ambulatory Visit: Payer: Self-pay | Admitting: Internal Medicine

## 2011-11-09 NOTE — Telephone Encounter (Signed)
Ok to refill 

## 2012-03-24 ENCOUNTER — Telehealth: Payer: Self-pay | Admitting: *Deleted

## 2012-03-24 NOTE — Telephone Encounter (Signed)
Received fax stating the drug omeprazole is on the formulary, however the drug has a quantity limitation. In order fro pt to continue getting a higher quantity needing md to provide coverage review. Notified insurance comp. Gave clinical information. Med was approve from 03/24/12-03/24/13 with case ID # [id number]...Raechel Chute

## 2012-04-12 ENCOUNTER — Other Ambulatory Visit: Payer: Self-pay | Admitting: *Deleted

## 2012-04-12 MED ORDER — POTASSIUM CHLORIDE CRYS ER 20 MEQ PO TBCR: 20.0000 meq | EXTENDED_RELEASE_TABLET | Freq: Two times a day (BID) | ORAL | Status: DC

## 2012-09-12 ENCOUNTER — Other Ambulatory Visit: Payer: Self-pay | Admitting: Internal Medicine

## 2012-09-12 ENCOUNTER — Other Ambulatory Visit: Payer: Self-pay | Admitting: Cardiology

## 2012-10-13 ENCOUNTER — Other Ambulatory Visit: Payer: Self-pay | Admitting: Internal Medicine

## 2012-10-13 ENCOUNTER — Other Ambulatory Visit: Payer: Self-pay | Admitting: Cardiology

## 2012-11-12 ENCOUNTER — Other Ambulatory Visit: Payer: Self-pay | Admitting: Cardiology

## 2012-12-13 ENCOUNTER — Other Ambulatory Visit: Payer: Self-pay | Admitting: Cardiology

## 2013-01-25 ENCOUNTER — Ambulatory Visit (INDEPENDENT_AMBULATORY_CARE_PROVIDER_SITE_OTHER): Payer: PRIVATE HEALTH INSURANCE | Admitting: Cardiology

## 2013-01-25 ENCOUNTER — Encounter: Payer: Self-pay | Admitting: Cardiology

## 2013-01-25 VITALS — BP 140/75 | HR 64 | Ht 67.0 in | Wt 179.0 lb

## 2013-01-25 DIAGNOSIS — I2510 Atherosclerotic heart disease of native coronary artery without angina pectoris: Secondary | ICD-10-CM

## 2013-01-25 DIAGNOSIS — E785 Hyperlipidemia, unspecified: Secondary | ICD-10-CM

## 2013-01-25 NOTE — Patient Instructions (Signed)
The current medical regimen is effective;  continue present plan and medications.  Follow up in 1 year with Dr Hochrein.  You will receive a letter in the mail 2 months before you are due.  Please call us when you receive this letter to schedule your follow up appointment.  

## 2013-01-25 NOTE — Progress Notes (Signed)
HPI The patient presents for followup of her known coronary disease.   Since I last saw her she is doing well.  The patient denies any new symptoms such as chest discomfort, neck or arm discomfort. There has been no new shortness of breath, PND or orthopnea. There have been no reported palpitations, presyncope or syncope.  She walks her dog for activity.    Allergies  Allergen Reactions  . Ciprofloxacin   . Contrast Media [Iodinated Diagnostic Agents]   . Morphine   . Nitrofurantoin     Current Outpatient Prescriptions  Medication Sig Dispense Refill  . aspirin 325 MG tablet Take 325 mg by mouth as needed.        Marland Kitchen atenolol (TENORMIN) 50 MG tablet Patient may take 1 tablet in am and 1/2 tablet in PM  60 tablet  0  . cyanocobalamin (,VITAMIN B-12,) 1000 MCG/ML injection INJECT 1 ML EVERY 2 WEEKS  1 mL  1  . dicyclomine (BENTYL) 10 MG capsule 10 mg.       . ELAVIL 50 MG tablet TAKE 2 TABLETS AT BEDTIME  60 tablet  11  . fenofibrate 54 MG tablet 54 mg daily.       Marland Kitchen lisinopril (PRINIVIL,ZESTRIL) 10 MG tablet 10 mg daily.       . meclizine (ANTIVERT) 25 MG tablet Take 25 mg by mouth as needed.        . nitroGLYCERIN (NITROSTAT) 0.4 MG SL tablet Place 0.4 mg under the tongue as needed.        . nystatin cream (MYCOSTATIN)       . oxyCODONE (OXYCONTIN) 10 MG 12 hr tablet Take 10 mg by mouth every 12 (twelve) hours as needed.        Marland Kitchen PREMARIN 0.625 MG tablet TAKE 1 TABLET DAILY  30 tablet  11  . PRILOSEC 20 MG capsule TAKE (2) CAPSULES DAILY.  60 capsule  11  . rOPINIRole (REQUIP) 1 MG tablet 1 mg as needed.       Marland Kitchen SYNTHROID 112 MCG tablet TAKE 1 TABLET DAILY  30 tablet  5  . triamcinolone cream (KENALOG) 0.5 %       . bumetanide (BUMEX) 0.5 MG tablet Take 0.5 mg by mouth daily as needed.        . potassium chloride SA (K-DUR) 20 MEQ tablet Take 1 tablet (20 mEq total) by mouth 2 (two) times daily.  60 tablet  11   No current facility-administered medications for this visit.    Past  Medical History  Diagnosis Date  . CAD (coronary artery disease)     S/P CABG in 1995. Last cath in 2003 demonstrated left main 20% stenosis, LAD 50-70% mid stenosis, circumflex 40-50% stenosis in teh AV groove, 30% right coronary artery stenosis. Saphenous vein graft to the marginal was patenet. A LIMA to the LAD was patent.  . Pseudoaneurysm     Requiring repair following last cath  . Obstructive sleep apnea on CPAP     intol of CPAP - noncompliant  . Pancreatitis   . Tobacco abuse     Quit 02/2010  . Osteopenia   . BACK PAIN, CHRONIC   . CAROTID ARTERY STENOSIS   . Mitral valve disorders   . Anemia, iron deficiency   . Anemia, vitamin B12 deficiency   . Hypothyroidism   . GERD (gastroesophageal reflux disease)   . Diabetes mellitus     Diet controlled since weight loss    Past  Surgical History  Procedure Laterality Date  . Coronary artery bypass graft  [place], [place]  . Cardiac catheterization  2003  . Saphenous vein graft resection  2003  . Appendectomy    . Abdominal hysterectomy    . Lumbar fusion    . Breast lumpectomy    . Tonsillectomy      ROS:  As stated in the HPI and negative for all other systems.  PHYSICAL EXAM BP 140/75  Pulse 64  Ht 5\' 7"  (1.702 m)  Wt 179 lb (81.194 kg)  BMI 28.03 kg/m2 GENERAL:  Well appearing HEENT:  Pupils equal round and reactive, fundi not visualized, oral mucosa unremarkable NECK:  No jugular venous distention, waveform within normal limits, carotid upstroke brisk and symmetric, no bruits, no thyromegaly LUNGS:  Clear to auscultation bilaterally BACK:  No CVA tenderness CHEST:  Well healed sternotomy scar. HEART:  PMI not displaced or sustained,S1 and S2 within normal limits, no S3, no S4, no clicks, no rubs, apical holosystolic murmur ABD:  Flat, positive bowel sounds normal in frequency in pitch, no bruits, no rebound, no guarding, no midline pulsatile mass, no hepatomegaly, no splenomegaly EXT:  2 plus pulses  throughout, bilateral mild lower extremity edema, no cyanosis no clubbing   EKG:  Sinus rhythm, rate 77, axis leftward  intervals within normal limits, no acute ST-T wave changes.  01/25/2013   ASSESSMENT AND PLAN  CAD -  She says she would not want another catheterization but would consider it if her life was in danger. Stress test last year  Showed no evidence of ischemia or infarct with preserved ejection fraction. She will remain on meds as listed.   TOBACCO ABUSE -  She is using electronic cigarettes. I advised against long term use of this.     CAROTID ARTERY STENOSIS -  She had minimal plaque one year ago and no further testing is indicated at this point.   MITRAL VALVE DISORDERS -  She had mild to mod MR last year. No follow up echo is needed at present   Hyperlipidemia -  She won't take statins.  I will defer follow up to her primary provider.    Hypertension -  The blood pressure is at target. No change in medications is indicated. We will continue with therapeutic lifestyle changes (TLC).

## 2013-09-25 ENCOUNTER — Encounter: Payer: Self-pay | Admitting: Internal Medicine

## 2014-01-10 ENCOUNTER — Other Ambulatory Visit: Payer: Self-pay | Admitting: Internal Medicine

## 2014-01-31 ENCOUNTER — Ambulatory Visit: Payer: PRIVATE HEALTH INSURANCE | Admitting: Cardiology

## 2014-03-07 ENCOUNTER — Ambulatory Visit: Payer: PRIVATE HEALTH INSURANCE | Admitting: Cardiology

## 2014-03-21 ENCOUNTER — Ambulatory Visit (INDEPENDENT_AMBULATORY_CARE_PROVIDER_SITE_OTHER): Payer: Medicare Other | Admitting: Cardiology

## 2014-03-21 ENCOUNTER — Encounter: Payer: Self-pay | Admitting: Cardiology

## 2014-03-21 VITALS — BP 122/70 | HR 73 | Wt 165.0 lb

## 2014-03-21 DIAGNOSIS — I6523 Occlusion and stenosis of bilateral carotid arteries: Secondary | ICD-10-CM

## 2014-03-21 DIAGNOSIS — I2510 Atherosclerotic heart disease of native coronary artery without angina pectoris: Secondary | ICD-10-CM

## 2014-03-21 NOTE — Progress Notes (Signed)
HPI The patient presents for followup of her known coronary disease.   Since I last saw her she is doing OK.  The patient denies any new symptoms such as chest discomfort, neck or arm discomfort. There has been no new shortness of breath, PND or orthopnea. There have been no reported palpitations, presyncope or syncope.  She walks her dog for activity.    Allergies  Allergen Reactions  . Ciprofloxacin   . Contrast Media [Iodinated Diagnostic Agents]   . Morphine   . Nitrofurantoin     Current Outpatient Prescriptions  Medication Sig Dispense Refill  . aspirin 325 MG tablet Take 325 mg by mouth as needed.      Marland Kitchen. atenolol (TENORMIN) 50 MG tablet Patient may take 1 tablet in am and 1/2 tablet in PM 60 tablet 0  . bumetanide (BUMEX) 0.5 MG tablet Take 0.5 mg by mouth daily as needed.      . cyanocobalamin (,VITAMIN B-12,) 1000 MCG/ML injection INJECT 1 ML EVERY 2 WEEKS 1 mL 1  . dicyclomine (BENTYL) 10 MG capsule Take 10 mg by mouth as needed for spasms.     Marland Kitchen. ELAVIL 50 MG tablet TAKE 2 TABLETS AT BEDTIME 60 tablet 11  . meclizine (ANTIVERT) 25 MG tablet Take 25 mg by mouth as needed.      . nitroGLYCERIN (NITROSTAT) 0.4 MG SL tablet Place 0.4 mg under the tongue as needed.      Marland Kitchen. oxyCODONE (OXYCONTIN) 10 MG 12 hr tablet Take 10 mg by mouth every 12 (twelve) hours as needed.      . potassium chloride SA (K-DUR) 20 MEQ tablet Take 1 tablet (20 mEq total) by mouth 2 (two) times daily. (Patient taking differently: Take 20 mEq by mouth as needed. ) 60 tablet 11  . PREMARIN 0.625 MG tablet TAKE 1 TABLET DAILY 30 tablet 11  . PRILOSEC 20 MG capsule TAKE (2) CAPSULES DAILY. 60 capsule 11  . rOPINIRole (REQUIP) 1 MG tablet 1 mg as needed (as needed for restless body syndrome).     . SYNTHROID 112 MCG tablet TAKE 1 TABLET DAILY 30 tablet 5   No current facility-administered medications for this visit.    Past Medical History  Diagnosis Date  . CAD (coronary artery disease)     S/P CABG in  1995. Last cath in 2003 demonstrated left main 20% stenosis, LAD 50-70% mid stenosis, circumflex 40-50% stenosis in teh AV groove, 30% right coronary artery stenosis. Saphenous vein graft to the marginal was patenet. A LIMA to the LAD was patent.  . Pseudoaneurysm     Requiring repair following last cath  . Obstructive sleep apnea on CPAP     intol of CPAP - noncompliant  . Pancreatitis   . Tobacco abuse     Quit 02/2010  . Osteopenia   . BACK PAIN, CHRONIC   . CAROTID ARTERY STENOSIS   . Mitral valve disorders   . Anemia, iron deficiency   . Anemia, vitamin B12 deficiency   . Hypothyroidism   . GERD (gastroesophageal reflux disease)   . Diabetes mellitus     Diet controlled since weight loss    Past Surgical History  Procedure Laterality Date  . Coronary artery bypass graft  [place]1995    [place], [place]  . Cardiac catheterization  2003  . Saphenous vein graft resection  2003  . Appendectomy    . Abdominal hysterectomy    . Lumbar fusion    . Breast lumpectomy    .  Tonsillectomy      ROS:  Right knee pain. Otherwise as stated in the HPI and negative for all other systems.  PHYSICAL EXAM BP 122/70 mmHg  Pulse 73  Wt 165 lb (74.844 kg) GENERAL:  Well appearing HEENT:  Pupils equal round and reactive, fundi not visualized, oral mucosa unremarkable NECK:  No jugular venous distention, waveform within normal limits, carotid upstroke brisk and symmetric, no bruits, no thyromegaly LUNGS:  Clear to auscultation bilaterally BACK:  No CVA tenderness CHEST:  Well healed sternotomy scar. HEART:  PMI not displaced or sustained,S1 and S2 within normal limits, no S3, no S4, no clicks, no rubs, apical holosystolic murmur ABD:  Flat, positive bowel sounds normal in frequency in pitch, no bruits, no rebound, no guarding, no midline pulsatile mass, no hepatomegaly, no splenomegaly EXT:  2 plus pulses throughout, bilateral mild lower extremity edema, no cyanosis no clubbing   EKG:  Sinus  rhythm, rate 73, axis leftward  intervals within normal limits, no acute ST-T wave changes.  03/21/2014   ASSESSMENT AND PLAN  CAD -  She has had no new symptoms since stress testing in 2013. She will continue with risk reduction.  TOBACCO ABUSE -  She is using electronic cigarettes. I advised against long term use of this.     CAROTID ARTERY STENOSIS -  I will arrange follow-up. She had some mild plaque in the past.   MITRAL VALVE DISORDERS -  She had mild to mod MR last year. No follow up echo is needed at present   Hyperlipidemia -  She won't take statins.  I will defer follow up to her primary provider.    Hypertension -  The blood pressure is at target. No change in medications is indicated. We will continue with therapeutic lifestyle changes (TLC).

## 2014-03-21 NOTE — Patient Instructions (Signed)

## 2014-03-30 ENCOUNTER — Encounter (HOSPITAL_COMMUNITY): Payer: Medicaid Other

## 2014-04-20 ENCOUNTER — Encounter (HOSPITAL_COMMUNITY): Payer: Medicaid Other

## 2014-06-07 ENCOUNTER — Encounter: Payer: Self-pay | Admitting: Internal Medicine

## 2015-08-06 ENCOUNTER — Encounter: Payer: Self-pay | Admitting: Cardiology

## 2015-08-13 NOTE — Progress Notes (Signed)
HPI The patient presents for followup of her known coronary disease.  She presents today and she is wearing a back brace because of pain. She recently broke some ribs. She said that for the past couple of weeks she's been having some palpitations. She'll feel her heart beating hard at night. She says that at times it might be in the 110 range or so. She's had take a couple of nitroglycerin in the past couple of weeks. She gets some chest discomfort that is hard for her to quantify or qualify. It comes on at rest. It might be like previous angina. She doesn't describe associated symptoms. It seems to happen with the increased heart rate. She's not describing any new PND or orthopnea.  She does have some shortness of breath which she's been noticing the increased heart rates as well. She's not describing neck or arm pain. She's not having associated nausea vomiting or diaphoresis. She's had no presyncope or syncope. The symptoms do go away sometimes spontaneously around at times with nitroglycerin.  Allergies  Allergen Reactions  . Contrast Media [Iodinated Diagnostic Agents]   . Morphine   . Nitrofurantoin   . Ciprofloxacin Rash    Current Outpatient Prescriptions  Medication Sig Dispense Refill  . amitriptyline (ELAVIL) 50 MG tablet Take by mouth. Take 2 tablets by mouth at bedtime    . aspirin 325 MG tablet Take 325 mg by mouth as needed.      Marland Kitchen. atenolol (TENORMIN) 50 MG tablet Take 50 mg by mouth daily. Patient may take one tablet by mouth in the morning and 1/2 tablet in the evening    . bumetanide (BUMEX) 0.5 MG tablet Take 0.5 mg by mouth daily as needed.      . dicyclomine (BENTYL) 10 MG capsule Take 10 mg by mouth as needed for spasms.     Marland Kitchen. levothyroxine (SYNTHROID, LEVOTHROID) 112 MCG tablet Take 112 mcg by mouth daily before breakfast.    . meclizine (ANTIVERT) 25 MG tablet Take 25 mg by mouth as needed.      . nitroGLYCERIN (NITROSTAT) 0.4 MG SL tablet Place 0.4 mg under the tongue  as needed.      Marland Kitchen. oxyCODONE (OXYCONTIN) 10 MG 12 hr tablet Take 10 mg by mouth every 12 (twelve) hours as needed.      . potassium chloride SA (K-DUR) 20 MEQ tablet Take 1 tablet (20 mEq total) by mouth 2 (two) times daily. (Patient taking differently: Take 20 mEq by mouth as needed. ) 60 tablet 11  . rOPINIRole (REQUIP) 1 MG tablet Take 1 mg by mouth as needed (as needed for restless body syndrome).     . cyanocobalamin (,VITAMIN B-12,) 1000 MCG/ML injection INJECT 1 ML EVERY 2 WEEKS 1 mL 1   No current facility-administered medications for this visit.    Past Medical History  Diagnosis Date  . CAD (coronary artery disease)     S/P CABG in 1995. Last cath in 2003 demonstrated left main 20% stenosis, LAD 50-70% mid stenosis, circumflex 40-50% stenosis in the AV groove, 30% right coronary artery stenosis. Saphenous vein graft to the marginal was patent. A LIMA to the LAD was patent.  . Pseudoaneurysm (HCC)     Requiring repair following last cath  . Obstructive sleep apnea on CPAP     intol of CPAP - noncompliant  . Pancreatitis   . Tobacco abuse     Quit 02/2010  . Osteopenia   . BACK PAIN, CHRONIC   .  CAROTID ARTERY STENOSIS   . Mitral valve disorders   . Anemia, iron deficiency   . Anemia, vitamin B12 deficiency   . Hypothyroidism   . GERD (gastroesophageal reflux disease)   . Diabetes mellitus     Diet controlled since weight loss    Past Surgical History  Procedure Laterality Date  . Coronary artery bypass graft  [place], [place]  . Cardiac catheterization  2003  . Saphenous vein graft resection  2003  . Appendectomy    . Abdominal hysterectomy    . Lumbar fusion    . Breast lumpectomy    . Tonsillectomy      ROS:  Right knee pain. Otherwise as stated in the HPI and negative for all other systems.  PHYSICAL EXAM BP 170/80 mmHg  Pulse 74  Ht  (1.727 m)  Wt 167 lb (75.751 kg)  BMI 25.40 kg/m2 GENERAL:  Somewhat frail appearing HEENT:  Pupils equal  round and reactive, fundi not visualized, oral mucosa unremarkable NECK:  No jugular venous distention, waveform within normal limits, carotid upstroke brisk and symmetric, no bruits, no thyromegaly LUNGS:  Clear to auscultation bilaterally BACK:  No CVA tenderness CHEST:  Well healed sternotomy scar. HEART:  PMI not displaced or sustained,S1 and S2 within normal limits, no S3, no S4, no clicks, no rubs, apical holosystolic murmur ABD:  Flat, positive bowel sounds normal in frequency in pitch, no bruits, no rebound, no guarding, no midline pulsatile mass, no hepatomegaly, no splenomegaly, Note the exam is somewhat compromised because of a back brace EXT:  2 plus pulses throughout, bilateral mild lower extremity edema, no cyanosis no clubbing   EKG:  Sinus rhythm, rate 74, axis leftward  intervals within normal limits, no acute ST-T wave changes.  08/14/2015   ASSESSMENT AND PLAN  CAD -  She has some new chest pain with atypical features mostly.  I will screen with Lexiscan Myoview.  TOBACCO ABUSE -  She is using electronic cigarettes. I advised against long term use of this.     CAROTID ARTERY STENOSIS -  I will arrange follow-up. She missed the last test.    MITRAL VALVE DISORDERS -  She had mild to mod MR in 2013.  I will repeat an echo.    Hyperlipidemia -  She won't take statins.  I will defer follow up to her primary provider.    Hypertension -  The blood pressure is elevated but this is unusual. She can follow this at home.    Palpitations - I will check a 48 hour Holter

## 2015-08-14 ENCOUNTER — Encounter: Payer: Self-pay | Admitting: Cardiology

## 2015-08-14 ENCOUNTER — Ambulatory Visit (INDEPENDENT_AMBULATORY_CARE_PROVIDER_SITE_OTHER): Payer: Medicare HMO | Admitting: *Deleted

## 2015-08-14 ENCOUNTER — Ambulatory Visit (INDEPENDENT_AMBULATORY_CARE_PROVIDER_SITE_OTHER): Payer: Medicare HMO | Admitting: Cardiology

## 2015-08-14 ENCOUNTER — Telehealth: Payer: Self-pay | Admitting: Cardiology

## 2015-08-14 VITALS — BP 170/80 | HR 74 | Ht 68.0 in | Wt 167.0 lb

## 2015-08-14 DIAGNOSIS — R072 Precordial pain: Secondary | ICD-10-CM

## 2015-08-14 DIAGNOSIS — I6523 Occlusion and stenosis of bilateral carotid arteries: Secondary | ICD-10-CM | POA: Diagnosis not present

## 2015-08-14 DIAGNOSIS — I340 Nonrheumatic mitral (valve) insufficiency: Secondary | ICD-10-CM

## 2015-08-14 DIAGNOSIS — I2510 Atherosclerotic heart disease of native coronary artery without angina pectoris: Secondary | ICD-10-CM

## 2015-08-14 NOTE — Patient Instructions (Signed)
Medication Instructions:  The current medical regimen is effective;  continue present plan and medications.  Testing/Procedures: Your physician has recommended that you wear a holter monitor. Holter monitors are medical devices that record the heart's electrical activity. Doctors most often use these monitors to diagnose arrhythmias. Arrhythmias are problems with the speed or rhythm of the heartbeat. The monitor is a small, portable device. You can wear one while you do your normal daily activities. This is usually used to diagnose what is causing palpitations/syncope (passing out).  Your physician has requested that you have an echocardiogram. Echocardiography is a painless test that uses sound waves to create images of your heart. It provides your doctor with information about the size and shape of your heart and how well your heart's chambers and valves are working. This procedure takes approximately one hour. There are no restrictions for this procedure.  Your physician has requested that you have a carotid duplex. This test is an ultrasound of the carotid arteries in your neck. It looks at blood flow through these arteries that supply the brain with blood. Allow one hour for this exam. There are no restrictions or special instructions.  Your physician has requested that you have a lexiscan myoview. For further information please visit [email or web address]. Please follow instruction sheet, as given.  Follow-Up: Follow up with Dr Anto[place]rein in [place] after testing has been completed.  If you need a refill on your cardiac medications before your next appointment, please call your pharmacy.  Thank you for choosing [facility]!!

## 2015-08-14 NOTE — Progress Notes (Signed)
Pt sent over today from Dr.Hochreins office to have holter monitor placed.

## 2015-08-14 NOTE — Telephone Encounter (Signed)
48 hr holter order - [facility] states [facility] put on a monitor w holter capabilities but is an extended event monitor. If holter doesn't return anything w/ diagnosable results w/in 48 hrs, will automatically switch to event mode - is this OK w/ Dr. Antoine PocheHochrein and if so, how many days should she continue to wear?  She notes insurance will bill for up to 30 days regardless, due to the fact that [facility] started pt on an event monitor, not holter monitor ---  so cost to patient would not change for this use as this has already been billed to insurance as event.  Advised should be OK, will defer to Dr. Antoine PocheHochrein for inquiry on # of days recommended. I will return call w/ Dr. Jenene SlickerHochrein's recommendation.

## 2015-08-14 NOTE — Telephone Encounter (Signed)
° °  New Prob   Has a question regarding the order Dr. Antoine PocheHochrein sent over for this patient. Please call.

## 2015-08-15 ENCOUNTER — Telehealth: Payer: Self-pay

## 2015-08-15 NOTE — Telephone Encounter (Signed)
One week would be long enough.

## 2015-08-15 NOTE — Telephone Encounter (Signed)
Returned call and recommendation communicated to [place] at [facility].

## 2015-08-15 NOTE — Telephone Encounter (Signed)
Received call from [facility] at [facility] stating that Dr Antoine PocheHochrein had decided to extend days on patients heart monitor from 2 days to 7 days. Asked if we would contact patient and make her aware. Advised we would. Contacted patient and notified her to wear for extended time. Patient verbalized understanding

## 2015-08-21 ENCOUNTER — Encounter (HOSPITAL_COMMUNITY): Payer: Medicare HMO

## 2015-08-21 ENCOUNTER — Ambulatory Visit (HOSPITAL_COMMUNITY)
Admission: RE | Admit: 2015-08-21 | Discharge: 2015-08-21 | Disposition: A | Discharge status: Home / Self Care | Payer: Medicare HMO | Source: Ambulatory Visit | Attending: Cardiology | Admitting: Cardiology

## 2015-08-21 DIAGNOSIS — I700 Atherosclerosis of aorta: Secondary | ICD-10-CM | POA: Diagnosis not present

## 2015-08-21 DIAGNOSIS — I6523 Occlusion and stenosis of bilateral carotid arteries: Secondary | ICD-10-CM | POA: Diagnosis present

## 2015-08-22 ENCOUNTER — Other Ambulatory Visit (HOSPITAL_COMMUNITY): Payer: Medicare HMO

## 2015-08-22 ENCOUNTER — Ambulatory Visit: Payer: Self-pay | Admitting: Family Medicine

## 2015-08-28 ENCOUNTER — Inpatient Hospital Stay (HOSPITAL_COMMUNITY): Admission: RE | Admit: 2015-08-28 | Payer: Medicare HMO | Source: Ambulatory Visit

## 2015-08-28 ENCOUNTER — Encounter (HOSPITAL_COMMUNITY): Payer: Medicare HMO

## 2015-08-28 ENCOUNTER — Ambulatory Visit (HOSPITAL_COMMUNITY): Payer: Medicare HMO

## 2015-08-29 ENCOUNTER — Telehealth: Payer: Self-pay | Admitting: Cardiology

## 2015-08-29 NOTE — Telephone Encounter (Signed)
Returned call. Pt angry w me, states she got 2 calls, 1 yesterday, 1 today, but she doesn't know what about. She has our number on caller ID. I asked if this might have been about the test results, she states Nya already called her about the carotid and holter. Asked if this was related to the upcoming scheduled stress test, and she states she already got that set up. She wants to know who called and why - informed her I wasn't sure but would see if Nya had called about anything else. Will route.

## 2015-08-29 NOTE — Telephone Encounter (Signed)
Follow-up      The pt is calling to get results

## 2015-09-02 ENCOUNTER — Encounter: Payer: Self-pay | Admitting: Family Medicine

## 2015-09-02 ENCOUNTER — Other Ambulatory Visit: Payer: Self-pay | Admitting: Family Medicine

## 2015-09-02 ENCOUNTER — Ambulatory Visit (INDEPENDENT_AMBULATORY_CARE_PROVIDER_SITE_OTHER): Payer: Medicare HMO | Admitting: Family Medicine

## 2015-09-02 VITALS — BP 130/72 | HR 79 | Temp 97.5°F | Ht 68.0 in | Wt 164.0 lb

## 2015-09-02 DIAGNOSIS — G8929 Other chronic pain: Secondary | ICD-10-CM

## 2015-09-02 DIAGNOSIS — M549 Dorsalgia, unspecified: Secondary | ICD-10-CM | POA: Diagnosis not present

## 2015-09-02 DIAGNOSIS — B769 Hookworm disease, unspecified: Secondary | ICD-10-CM

## 2015-09-02 DIAGNOSIS — I2510 Atherosclerotic heart disease of native coronary artery without angina pectoris: Secondary | ICD-10-CM

## 2015-09-02 DIAGNOSIS — E119 Type 2 diabetes mellitus without complications: Secondary | ICD-10-CM | POA: Diagnosis not present

## 2015-09-02 LAB — BAYER DCA HB A1C WAIVED: HB A1C (BAYER DCA - WAIVED): 5.1 % (ref <7.0)

## 2015-09-02 MED ORDER — ALBENDAZOLE 200 MG PO TABS: 400.0000 mg | ORAL_TABLET | Freq: Once | ORAL | Status: DC

## 2015-09-02 MED ORDER — BACLOFEN 10 MG PO TABS: 10.0000 mg | ORAL_TABLET | Freq: Three times a day (TID) | ORAL | Status: DC

## 2015-09-02 MED ORDER — NITROGLYCERIN 0.4 MG SL SUBL: 0.4000 mg | SUBLINGUAL_TABLET | SUBLINGUAL | Status: DC | PRN

## 2015-09-02 MED ORDER — TRAZODONE HCL 50 MG PO TABS: 50.0000 mg | ORAL_TABLET | Freq: Every evening | ORAL | Status: DC | PRN

## 2015-09-02 NOTE — Patient Instructions (Signed)
Great to meet you!  I have sent albendazole for your worm, take 2 tablets once  I have sent baclofen which is a muscle relaxer, 1 pill three times daily.    For sleep:  To stop amitriptyline: take 1 tablet every night for 2 weeks, then 1/2 tablet for 2 weeks then quit.  While you are quitting you may try 50 mg (1 pill) of trazodone, go up to 2 pills if needed when you quit amitriptyline.   Come back in 1 month to see how it is going.   We will call with labs within 1 week

## 2015-09-02 NOTE — Telephone Encounter (Signed)
i spoke with pt already about her test, don't know who called her after.

## 2015-09-02 NOTE — Progress Notes (Signed)
   HPI  Patient presents today here to establish care with a few complaints.  Patient states that she has diabetes, she does not take medications, she did not like that her previous provider had her come in every 3 months.  She requests a muscle relaxer, she takes Endocet prescribed by her orthopedic surgeon for chronic back pain.  Amitriptyline has been taken for years to help her sleep and help with pain. She does not feel it helps with pain, however she's had a hard time quitting it several times previously. She would like something to help her sleep.  She has known heart disease and has an echo and a stress test scheduled for 4 days from now. He requests refill of her nitroglycerin because she states that her other prescription is too old and doesn't dissolve very fast.  She reports large worms in her stool for several weeks.  PMH: Smoking status noted Past surgical history significant for CABG, abdominal hysterectomy, tonsillectomy, breast lumpectomy. Has medical history significant for diabetes, pancreatitis, obstructive sleep apnea with C Pap, CAD, anemia Family history positive for CAD and brother and mother, stroke in mother and maternal grandfather. Former smoker, does not drink alcohol   ROS: Per HPI  Objective: BP 130/72 mmHg  Pulse 79  Temp(Src) 97.5 F (36.4 C) (Oral)  Ht _0  (1.727 m)  Wt 164 lb (74.39 kg)  BMI 24.94 kg/m2 Gen: NAD, alert, cooperative with exam HEENT: NCAT, TMs normal bilaterally, oropharynx pink and moist CV: RRR Resp: CTABL, no wheezes, non-labored Ext: No edema, warm Neuro: Alert and oriented, No gross deficits  Wearing a back brace.   Assessment and plan:  # Chronic back pain Opiate medication per her orthopedic surgeon She requests a muscle relaxer today, we discussed the beers criteria and avoiding sedating muscle relaxers, prescribed baclofen which is not on the beers list.  Titrating off of amitriptyline, starting trazodone to  help sleep. I have written clear instructions on how to quit amitriptyline, 80 mg for 2 weeks, then 25 mg for 2 weeks then stop. Start 50 mg of trazodone when starting titration down and may take up to 100 mg whenever she stops.  # Worm infestation, likely hookworm Treat with albendazole  # T2DM A1C today, labs No meds  # CAD Refilled Nitro, she has a stress test  In 4 days, no chest pain currently   Orders Placed This Encounter  Procedures  . Bayer DCA Hb A1c Waived  . CBC with Differential/Platelet  . [place], MD [facility] 09/02/2015, 1:52 PM

## 2015-09-03 ENCOUNTER — Telehealth: Payer: Self-pay | Admitting: Family Medicine

## 2015-09-03 LAB — CMP14+EGFR
ALBUMIN: 4.3 g/dL (ref 3.5–4.8)
ALT: 19 IU/L (ref 0–32)
AST: 26 IU/L (ref 0–40)
Albumin/Globulin Ratio: 1.5 (ref 1.2–2.2)
Alkaline Phosphatase: 90 IU/L (ref 39–117)
BILIRUBIN TOTAL: 0.2 mg/dL (ref 0.0–1.2)
BUN / CREAT RATIO: 22 (ref 12–28)
BUN: 18 mg/dL (ref 8–27)
CALCIUM: 9.9 mg/dL (ref 8.7–10.3)
CO2: 27 mmol/L (ref 18–29)
CREATININE: 0.82 mg/dL (ref 0.57–1.00)
Chloride: 96 mmol/L (ref 96–106)
GFR, EST AFRICAN AMERICAN: 82 mL/min/{1.73_m2} (ref >59)
GFR, EST NON AFRICAN AMERICAN: 71 mL/min/{1.73_m2} (ref >59)
GLUCOSE: 80 mg/dL (ref 65–99)
Globulin, Total: 2.9 g/dL (ref 1.5–4.5)
Potassium: 4.9 mmol/L (ref 3.5–5.2)
Sodium: 138 mmol/L (ref 134–144)
TOTAL PROTEIN: 7.2 g/dL (ref 6.0–8.5)

## 2015-09-03 LAB — CBC WITH DIFFERENTIAL/PLATELET
BASOS ABS: 0.1 10*3/uL (ref 0.0–0.2)
Basos: 1 %
EOS (ABSOLUTE): 0.1 10*3/uL (ref 0.0–0.4)
Eos: 1 %
HEMOGLOBIN: 14.1 g/dL (ref 11.1–15.9)
Hematocrit: 43.9 % (ref 34.0–46.6)
IMMATURE GRANS (ABS): 0 10*3/uL (ref 0.0–0.1)
IMMATURE GRANULOCYTES: 0 %
Lymphocytes Absolute: 3.4 10*3/uL — ABNORMAL HIGH (ref 0.7–3.1)
Lymphs: 38 %
MCH: 30.5 pg (ref 26.6–33.0)
MCHC: 32.1 g/dL (ref 31.5–35.7)
MCV: 95 fL (ref 79–97)
MONOCYTES: 7 %
Monocytes Absolute: 0.6 10*3/uL (ref 0.1–0.9)
NEUTROS ABS: 4.7 10*3/uL (ref 1.4–7.0)
Neutrophils: 53 %
PLATELETS: 301 10*3/uL (ref 150–379)
RBC: 4.63 x10E6/uL (ref 3.77–5.28)
RDW: 13.4 % (ref 12.3–15.4)
WBC: 8.8 10*3/uL (ref 3.4–10.8)

## 2015-09-03 MED ORDER — IVERMECTIN 3 MG PO TABS: 200.0000 ug/kg | ORAL_TABLET | Freq: Once | ORAL | Status: DC

## 2015-09-03 NOTE — Telephone Encounter (Signed)
Ivermectin Sent  Murtis SinkSam Jaelene Garciagarcia, MD [facility] 09/03/2015, 12:35 PM

## 2015-09-03 NOTE — Telephone Encounter (Signed)
Left message rx was sent to pharmacy

## 2015-09-03 NOTE — Addendum Note (Signed)
Addended by: Elenora GammaBRADSHAW, Maebell Lyvers L on: 09/03/2015 12:36 PM   Modules accepted: Orders, Medications

## 2015-09-03 NOTE — Telephone Encounter (Signed)
Will ask nursing to call and ask for recommendations from pharmacy  Mebendazole is a good alternative but not well covered.   Murtis SinkSam Jacquees Gongora, MD [facility] 09/03/2015, 8:03 AM

## 2015-09-03 NOTE — Telephone Encounter (Signed)
lmtcb

## 2015-09-03 NOTE — Telephone Encounter (Signed)
Recommended 

## 2015-09-05 ENCOUNTER — Other Ambulatory Visit (HOSPITAL_COMMUNITY): Payer: Medicare HMO

## 2015-09-05 ENCOUNTER — Ambulatory Visit (HOSPITAL_COMMUNITY): Payer: Medicare HMO

## 2015-09-05 ENCOUNTER — Encounter (HOSPITAL_COMMUNITY): Payer: Medicare HMO

## 2015-09-06 ENCOUNTER — Encounter (HOSPITAL_COMMUNITY): Payer: Self-pay

## 2015-09-06 ENCOUNTER — Other Ambulatory Visit: Payer: Self-pay | Admitting: Family Medicine

## 2015-09-06 ENCOUNTER — Ambulatory Visit (HOSPITAL_COMMUNITY)
Admission: RE | Admit: 2015-09-06 | Discharge: 2015-09-06 | Disposition: A | Discharge status: Home / Self Care | Payer: Medicare HMO | Source: Ambulatory Visit | Attending: Cardiology | Admitting: Cardiology

## 2015-09-06 ENCOUNTER — Encounter (HOSPITAL_COMMUNITY)
Admission: RE | Admit: 2015-09-06 | Discharge: 2015-09-06 | Disposition: A | Discharge status: Home / Self Care | Payer: Medicare HMO | Source: Ambulatory Visit | Attending: Cardiology | Admitting: Cardiology

## 2015-09-06 ENCOUNTER — Encounter (HOSPITAL_COMMUNITY): Payer: Medicare HMO

## 2015-09-06 DIAGNOSIS — Z720 Tobacco use: Secondary | ICD-10-CM | POA: Insufficient documentation

## 2015-09-06 DIAGNOSIS — E119 Type 2 diabetes mellitus without complications: Secondary | ICD-10-CM | POA: Insufficient documentation

## 2015-09-06 DIAGNOSIS — I517 Cardiomegaly: Secondary | ICD-10-CM | POA: Insufficient documentation

## 2015-09-06 DIAGNOSIS — I2510 Atherosclerotic heart disease of native coronary artery without angina pectoris: Secondary | ICD-10-CM

## 2015-09-06 DIAGNOSIS — E785 Hyperlipidemia, unspecified: Secondary | ICD-10-CM | POA: Diagnosis not present

## 2015-09-06 DIAGNOSIS — I340 Nonrheumatic mitral (valve) insufficiency: Secondary | ICD-10-CM | POA: Diagnosis not present

## 2015-09-06 DIAGNOSIS — I059 Rheumatic mitral valve disease, unspecified: Secondary | ICD-10-CM | POA: Diagnosis present

## 2015-09-06 LAB — NM MYOCAR MULTI W/SPECT W/WALL MOTION / EF
CHL CUP NUCLEAR SDS: 1
CHL CUP RESTING HR STRESS: 75 {beats}/min
CSEPPHR: 86 {beats}/min
LV dias vol: 54 mL (ref 46–106)
LVSYSVOL: 13 mL
RATE: 0.22
SRS: 0
SSS: 1
TID: 1.11

## 2015-09-06 LAB — ECHOCARDIOGRAM COMPLETE
E decel time: 261 msec
E/e' ratio: 21.02
FS: 41 % (ref 28–44)
IVS/LV PW RATIO, ED: 1.02
LA ID, A-P, ES: 38 mm
LA diam end sys: 38 mm
LA diam index: 2 cm/m2
LA vol A4C: 68.5 ml
LA vol index: 34.7 mL/m2
LA vol: 65.9 mL
LV E/e' medial: 21.02
LV E/e'average: 21.02
LV PW d: 10.8 mm — AB (ref 0.6–1.1)
LV dias vol index: 26 mL/m2
LV dias vol: 49 mL (ref 46–106)
LV e' LATERAL: 6.09 cm/s
LV sys vol index: 10 mL/m2
LV sys vol: 19 mL (ref 14–42)
LVOT SV: 68 mL
LVOT VTI: 24.1 cm
LVOT area: 2.84 cm2
LVOT diameter: 19 mm
LVOT peak grad rest: 5 mmHg
LVOT peak vel: 109 cm/s
Lateral S' vel: 8.16 cm/s
MV Dec: 261
MV Peak grad: 7 mmHg
MV pk A vel: 106 m/s
MV pk E vel: 128 m/s
RV sys press: 23 mmHg
Reg peak vel: 223 cm/s
Simpson's disk: 62
Stroke v: 31 ml
TAPSE: 14.3 mm
TDI e' lateral: 6.09
TDI e' medial: 4.9
TR max vel: 223 cm/s
VTI: 167 cm

## 2015-09-06 MED ORDER — SODIUM CHLORIDE 0.9% FLUSH
INTRAVENOUS | Status: AC
  Filled 2015-09-06: qty 120

## 2015-09-06 MED ORDER — TECHNETIUM TC 99M TETROFOSMIN IV KIT
30.0000 | PACK | Freq: Once | INTRAVENOUS | Status: AC | PRN
  Administered 2015-09-06: 30 via INTRAVENOUS

## 2015-09-06 MED ORDER — REGADENOSON 0.4 MG/5ML IV SOLN
INTRAVENOUS | Status: AC
  Administered 2015-09-06: 0.4 mg
  Filled 2015-09-06: qty 5

## 2015-09-06 MED ORDER — TECHNETIUM TC 99M SESTAMIBI - CARDIOLITE
10.0000 | Freq: Once | INTRAVENOUS | Status: AC | PRN
  Administered 2015-09-06: 10 via INTRAVENOUS

## 2015-09-06 NOTE — Progress Notes (Signed)
4*PRELIMINARY RESULTS* Echocardiogram 2D Echocardiogram has been performed.  Stacey DrainWhite, Tranice Laduke J 09/06/2015, 12:22 PM

## 2015-09-09 ENCOUNTER — Telehealth: Payer: Self-pay | Admitting: Family Medicine

## 2015-09-09 NOTE — Telephone Encounter (Signed)
No TSH or Vit B level

## 2015-09-10 ENCOUNTER — Telehealth: Payer: Self-pay | Admitting: Family Medicine

## 2015-09-10 MED ORDER — AMITRIPTYLINE HCL 50 MG PO TABS: 100.0000 mg | ORAL_TABLET | Freq: Every day | ORAL | Status: DC

## 2015-09-10 NOTE — Telephone Encounter (Signed)
All done early this am, pt aware

## 2015-09-10 NOTE — Telephone Encounter (Signed)
RX written  Amber SinkSam Lakyra Tippins, MD [facility] 09/10/2015, 7:35 AM

## 2015-09-14 ENCOUNTER — Emergency Department (HOSPITAL_COMMUNITY)
Admission: EM | Admit: 2015-09-14 | Discharge: 2015-09-14 | Disposition: A | Discharge status: Home / Self Care | Payer: Medicare HMO | Attending: Emergency Medicine | Admitting: Emergency Medicine

## 2015-09-14 ENCOUNTER — Encounter (HOSPITAL_COMMUNITY): Payer: Self-pay | Admitting: *Deleted

## 2015-09-14 ENCOUNTER — Emergency Department (HOSPITAL_COMMUNITY): Payer: Medicare HMO

## 2015-09-14 DIAGNOSIS — Z951 Presence of aortocoronary bypass graft: Secondary | ICD-10-CM | POA: Insufficient documentation

## 2015-09-14 DIAGNOSIS — Z79899 Other long term (current) drug therapy: Secondary | ICD-10-CM | POA: Diagnosis not present

## 2015-09-14 DIAGNOSIS — Z7982 Long term (current) use of aspirin: Secondary | ICD-10-CM | POA: Diagnosis not present

## 2015-09-14 DIAGNOSIS — R079 Chest pain, unspecified: Secondary | ICD-10-CM | POA: Diagnosis present

## 2015-09-14 DIAGNOSIS — R519 Headache, unspecified: Secondary | ICD-10-CM

## 2015-09-14 DIAGNOSIS — Z87891 Personal history of nicotine dependence: Secondary | ICD-10-CM | POA: Diagnosis not present

## 2015-09-14 DIAGNOSIS — R51 Headache: Secondary | ICD-10-CM | POA: Insufficient documentation

## 2015-09-14 DIAGNOSIS — I2510 Atherosclerotic heart disease of native coronary artery without angina pectoris: Secondary | ICD-10-CM | POA: Insufficient documentation

## 2015-09-14 DIAGNOSIS — E119 Type 2 diabetes mellitus without complications: Secondary | ICD-10-CM | POA: Diagnosis not present

## 2015-09-14 LAB — CBC WITH DIFFERENTIAL/PLATELET
BASOS PCT: 1 %
Basophils Absolute: 0.1 10*3/uL (ref 0.0–0.1)
Eosinophils Absolute: 0 10*3/uL (ref 0.0–0.7)
Eosinophils Relative: 1 %
HEMATOCRIT: 37.5 % (ref 36.0–46.0)
HEMOGLOBIN: 12.3 g/dL (ref 12.0–15.0)
Lymphocytes Relative: 18 %
Lymphs Abs: 1.2 10*3/uL (ref 0.7–4.0)
MCH: 30.9 pg (ref 26.0–34.0)
MCHC: 32.8 g/dL (ref 30.0–36.0)
MCV: 94.2 fL (ref 78.0–100.0)
MONOS PCT: 8 %
Monocytes Absolute: 0.5 10*3/uL (ref 0.1–1.0)
NEUTROS ABS: 4.6 10*3/uL (ref 1.7–7.7)
NEUTROS PCT: 72 %
Platelets: 231 10*3/uL (ref 150–400)
RBC: 3.98 MIL/uL (ref 3.87–5.11)
RDW: 12.5 % (ref 11.5–15.5)
WBC: 6.4 10*3/uL (ref 4.0–10.5)

## 2015-09-14 LAB — COMPREHENSIVE METABOLIC PANEL
ALBUMIN: 4 g/dL (ref 3.5–5.0)
ALT: 21 U/L (ref 14–54)
ANION GAP: 7 (ref 5–15)
AST: 31 U/L (ref 15–41)
Alkaline Phosphatase: 73 U/L (ref 38–126)
BILIRUBIN TOTAL: 0.6 mg/dL (ref 0.3–1.2)
BUN: 11 mg/dL (ref 6–20)
CO2: 27 mmol/L (ref 22–32)
Calcium: 9.2 mg/dL (ref 8.9–10.3)
Chloride: 105 mmol/L (ref 101–111)
Creatinine, Ser: 0.67 mg/dL (ref 0.44–1.00)
GFR calc Af Amer: >60 mL/min (ref >60)
GFR calc non Af Amer: >60 mL/min (ref >60)
GLUCOSE: 83 mg/dL (ref 65–99)
POTASSIUM: 3.4 mmol/L — AB (ref 3.5–5.1)
Sodium: 139 mmol/L (ref 135–145)
TOTAL PROTEIN: 7.2 g/dL (ref 6.5–8.1)

## 2015-09-14 LAB — TROPONIN I: Troponin I: <0.03 ng/mL (ref <0.03)

## 2015-09-14 MED ORDER — ONDANSETRON HCL 4 MG/2ML IJ SOLN
4.0000 mg | Freq: Once | INTRAMUSCULAR | Status: AC
  Administered 2015-09-14: 4 mg via INTRAVENOUS
  Filled 2015-09-14: qty 2

## 2015-09-14 MED ORDER — FAMOTIDINE IN NACL 20-0.9 MG/50ML-% IV SOLN
20.0000 mg | Freq: Once | INTRAVENOUS | Status: AC
  Administered 2015-09-14: 20 mg via INTRAVENOUS
  Filled 2015-09-14: qty 50

## 2015-09-14 MED ORDER — HYDRALAZINE HCL 20 MG/ML IJ SOLN
5.0000 mg | Freq: Once | INTRAMUSCULAR | Status: DC
  Filled 2015-09-14: qty 1

## 2015-09-14 MED ORDER — HYDROMORPHONE HCL 1 MG/ML IJ SOLN
1.0000 mg | Freq: Once | INTRAMUSCULAR | Status: AC
  Administered 2015-09-14: 1 mg via INTRAVENOUS
  Filled 2015-09-14: qty 1

## 2015-09-14 MED ORDER — HYDROCODONE-ACETAMINOPHEN 5-325 MG PO TABS
1.0000 | ORAL_TABLET | Freq: Once | ORAL | Status: AC
  Administered 2015-09-14: 1 via ORAL
  Filled 2015-09-14: qty 1

## 2015-09-14 MED ORDER — HYDROCODONE-ACETAMINOPHEN 5-325 MG PO TABS: 1.0000 | ORAL_TABLET | Freq: Four times a day (QID) | ORAL | Status: DC | PRN

## 2015-09-14 NOTE — ED Notes (Signed)
Pt alert & oriented x4, stable gait. Patient given discharge instructions, paperwork & prescription(s). Patient informed not to drive, operate any equipment & handel any important documents 4 hours after taking pain medication. Patient  instructed to stop at the registration desk to finish any additional paperwork. Patient  verbalized understanding. Pt left department w/ no further questions. 

## 2015-09-14 NOTE — ED Notes (Signed)
Pt states she took a nitroglycerin tablet yesterday that did help with the chest pain

## 2015-09-14 NOTE — ED Notes (Signed)
Pt c/o headache today and states she is had chest pain yesterday with some left arm pain

## 2015-09-14 NOTE — ED Notes (Signed)
Pt states chest pains, head ache & right arm pain since test being done on the 14th.

## 2015-09-14 NOTE — Discharge Instructions (Signed)
Follow up with your next week °

## 2015-09-14 NOTE — ED Provider Notes (Signed)
CSN: [id number]     Arrival date & time 09/14/15  1917 History   First MD Initiated Contact with Patient 09/14/15 1932     Chief Complaint  Patient presents with  . Chest Pain     (Consider location/radiation/quality/duration/timing/severity/associated sxs/prior Treatment) Patient is a 75 y.o. female presenting with chest pain. The history is provided by the patient (Patient has been complaining of some chest pain off-and-on for a number days. She recently had a Myoview done which was negative).  Chest Pain Pain location:  Substernal area Pain quality: aching   Pain radiates to:  Does not radiate Pain radiates to the back: no   Pain severity:  Moderate Onset quality:  Sudden Timing:  Intermittent Progression:  Waxing and waning Chronicity:  Recurrent Context: not breathing   Associated symptoms: no abdominal pain, no back pain, no cough, no fatigue and no headache     Past Medical History  Diagnosis Date  . CAD (coronary artery disease)     S/P CABG in 1995. Last cath in 2003 demonstrated left main 20% stenosis, LAD 50-70% mid stenosis, circumflex 40-50% stenosis in the AV groove, 30% right coronary artery stenosis. Saphenous vein graft to the marginal was patent. A LIMA to the LAD was patent.  . Pseudoaneurysm (HCC)     Requiring repair following last cath  . Obstructive sleep apnea on CPAP     intol of CPAP - noncompliant  . Pancreatitis   . Tobacco abuse     Quit 02/2010  . Osteopenia   . BACK PAIN, CHRONIC   . CAROTID ARTERY STENOSIS   . Mitral valve disorders   . Anemia, iron deficiency   . Anemia, vitamin B12 deficiency   . Hypothyroidism   . GERD (gastroesophageal reflux disease)   . Diabetes mellitus     Diet controlled since weight loss   Past Surgical History  Procedure Laterality Date  . Coronary artery bypass graft  [place], [place]  . Cardiac catheterization  2003  . Saphenous vein graft resection  2003  . Appendectomy    . Abdominal  hysterectomy    . Lumbar fusion    . Breast lumpectomy    . Tonsillectomy     Family History  Problem Relation Age of Onset  . Hypertension Other   . Hyperlipidemia Other   . Stroke Other   . Hypertension Father   . Heart disease Mother   . Stroke Mother   . Heart disease Brother   . Stroke Maternal Grandfather    Social History  Substance Use Topics  . Smoking status: Former Smoker -- 1.00 packs/day    Types: Cigarettes    Quit date: 01/23/2010  . Smokeless tobacco: None     Comment: Quit smoking years ago then resumed smoking in 2009  . Alcohol Use: No   OB History    No data available     Review of Systems  Constitutional: Negative for appetite change and fatigue.  HENT: Negative for congestion, ear discharge and sinus pressure.   Eyes: Negative for discharge.  Respiratory: Negative for cough.   Cardiovascular: Positive for chest pain.  Gastrointestinal: Negative for abdominal pain and diarrhea.  Genitourinary: Negative for frequency and hematuria.  Musculoskeletal: Negative for back pain.  Skin: Negative for rash.  Neurological: Negative for seizures and headaches.  Psychiatric/Behavioral: Negative for hallucinations.      Allergies  Contrast media; Morphine; Nitrofurantoin; and Ciprofloxacin  Home Medications   Prior to Admission  medications   Medication Sig Start Date End Date Taking? Authorizing Provider  amitriptyline (ELAVIL) 50 MG tablet Take 2 tablets (100 mg total) by mouth at bedtime. Take 2 tablets by mouth at bedtime Patient taking differently: Take 50-100 mg by mouth at bedtime as needed for sleep.  09/10/15  Yes Elenora Gamma, MD  aspirin 325 MG tablet Take 325 mg by mouth daily as needed for mild pain.    Yes Historical Provider, MD  atenolol (TENORMIN) 50 MG tablet TAKE ONE TABLET EVERY MORNING AND TAKE .5 TABLET EVERY EVENING 09/10/15  Yes Elenora Gamma, MD  baclofen (LIORESAL) 10 MG tablet Take 1 tablet (10 mg total) by mouth 3  (three) times daily. 09/02/15  Yes Elenora Gamma, MD  cyanocobalamin (,VITAMIN B-12,) 1000 MCG/ML injection INJECT IM EVERY 2 WEEKS 09/10/15  Yes Elenora Gamma, MD  dicyclomine (BENTYL) 10 MG capsule Take 10 mg by mouth daily as needed for spasms.  01/12/13  Yes Historical Provider, MD  levothyroxine (SYNTHROID, LEVOTHROID) 112 MCG tablet TAKE ONE (1) TABLET EACH DAY 09/10/15  Yes Elenora Gamma, MD  meclizine (ANTIVERT) 25 MG tablet Take 25 mg by mouth daily as needed for dizziness or nausea.    Yes Historical Provider, MD  nitroGLYCERIN (NITROSTAT) 0.4 MG SL tablet Place 1 tablet (0.4 mg total) under the tongue as needed. Patient taking differently: Place 0.4 mg under the tongue as needed for chest pain.  09/02/15  Yes Elenora Gamma, MD  oxyCODONE-acetaminophen (ENDOCET) 10-325 MG tablet Take 1 tablet by mouth every 4 (four) hours as needed for pain.   Yes Historical Provider, MD  potassium chloride SA (K-DUR,KLOR-CON) 20 MEQ tablet TAKE ONE TABLET BY MOUTH TWICE DAILY Patient taking differently: TAKE ONE TABLET BY MOUTH DAILY 09/10/15  Yes Elenora Gamma, MD  rOPINIRole (REQUIP) 1 MG tablet TAKE ONE (1) TABLET AT BEDTIME Patient taking differently: TAKE ONE TABLET BY MOUTH EVERY NIGHT EVERY OTHER DAY AT BEDTIME/ OR AS NEEDED FOR RESTLESS LEGS 09/10/15  Yes Elenora Gamma, MD  bumetanide (BUMEX) 1 MG tablet Take 1 mg by mouth 2 (two) times daily. 09/10/15   Historical Provider, MD  ivermectin (STROMECTOL) 3 MG TABS tablet Take 5 tablets (15,000 mcg total) by mouth once. Repeat 10 days after taking first dose Patient not taking: Reported on 09/14/2015 09/03/15   Elenora Gamma, MD  traZODone (DESYREL) 50 MG tablet Take 1-2 tablets (50-100 mg total) by mouth at bedtime as needed for sleep. Patient not taking: Reported on 09/14/2015 09/02/15   Elenora Gamma, MD   BP 176/63 mmHg  Pulse 87  Temp(Src) 99.1 F (37.3 C) (Oral)  Resp 18  Ht 5\' 6"  (1.676 m)  Wt 164 lb (74.39 kg)   BMI 26.48 kg/m2  SpO2 95% Physical Exam  Constitutional: She is oriented to person, place, and time. She appears well-developed.  HENT:  Head: Normocephalic.  Eyes: Conjunctivae and EOM are normal. No scleral icterus.  Neck: Neck supple. No thyromegaly present.  Cardiovascular: Normal rate and regular rhythm.  Exam reveals no gallop and no friction rub.   No murmur heard. Pulmonary/Chest: No stridor. She has no wheezes. She has no rales. She exhibits no tenderness.  Abdominal: She exhibits no distension. There is no tenderness. There is no rebound.  Musculoskeletal: Normal range of motion. She exhibits no edema.  Lymphadenopathy:    She has no cervical adenopathy.  Neurological: She is oriented to person, place, and time. She  exhibits normal muscle tone. Coordination normal.  Skin: No rash noted. No erythema.  Psychiatric: She has a normal mood and affect. Her behavior is normal.    ED Course  Procedures (including critical care time) Labs Review Labs Reviewed  COMPREHENSIVE METABOLIC PANEL - Abnormal; Notable for the following:    Potassium 3.4 (*)    All other components within normal limits  TROPONIN I  CBC WITH DIFFERENTIAL/PLATELET  CBC WITH DIFFERENTIAL/PLATELET    Imaging Review Dg Chest 2 View  09/14/2015  CLINICAL DATA:  Chest pain and headache today with left arm pain. EXAM: CHEST  2 VIEW COMPARISON:  None. FINDINGS: Sternotomy wires are present. Lungs are well inflated without focal consolidation or effusion. Cardiomediastinal silhouette is within normal. There are mild degenerate changes of the spine. Old left posterior eighth rib fracture. IMPRESSION: No acute cardiopulmonary disease. Electronically Signed   By: Elberta Fortis M.D.   On: 09/14/2015 20:42   Ct Head Wo Contrast  09/14/2015  CLINICAL DATA:  Patient with chest pain, headache and right arm pain. EXAM: CT HEAD WITHOUT CONTRAST TECHNIQUE: Contiguous axial images were obtained from the base of the skull  through the vertex without intravenous contrast. COMPARISON:  None. FINDINGS: Ventricles and sulci are appropriate for patient's age. No evidence for acute cortically based infarct, intracranial hemorrhage, mass lesion or mass-effect. Orbits are unremarkable. The paranasal sinuses are well aerated. Mastoid air cells unremarkable. Calvarium is intact. IMPRESSION: No acute intracranial process. Electronically Signed   By: Annia Belt M.D.   On: 09/14/2015 20:26   I have personally reviewed and evaluated these images and lab results as part of my medical decision-making.   EKG Interpretation   Date/Time:  Saturday September 14 2015 19:37:44 EDT Ventricular Rate:  86 PR Interval:    QRS Duration: 92 QT Interval:  372 QTC Calculation: 445 R Axis:   31 Text Interpretation:  Sinus rhythm Nonspecific T abnormalities, lateral  leads Confirmed by Shann Merrick  MD, Demitrious Mccannon ([id number]) on 09/14/2015 9:54:37 PM      MDM   Final diagnoses:  None      Patient with headache and chest pain. Troponin normal CT head normal. Chest pain resolved. Patient given some Vicodin for headache and will follow-up with her PCP this week doubt she is having coronary artery disease since he had a normal Myoview just couple weeks ago  Bethann Berkshire, MD 09/14/15 2220

## 2015-09-17 ENCOUNTER — Telehealth: Payer: Self-pay | Admitting: Family Medicine

## 2015-09-19 ENCOUNTER — Telehealth: Payer: Self-pay | Admitting: Family Medicine

## 2015-09-20 NOTE — Telephone Encounter (Signed)
I would recommend that she be seen for her acute illness.  She can also try over-the-counter cough suppressants like Coricidin.  Murtis Sink, MD [facility] 09/20/2015, 12:09 PM

## 2015-09-23 ENCOUNTER — Ambulatory Visit: Payer: Commercial Managed Care - HMO | Admitting: Family Medicine

## 2015-09-24 ENCOUNTER — Encounter: Payer: Self-pay | Admitting: Family Medicine

## 2015-09-25 NOTE — Telephone Encounter (Signed)
No answer/ no VM. This encounter will now be closed.

## 2015-10-14 NOTE — Progress Notes (Deleted)
HPI The patient presents for followup of her known coronary disease.  She saw me recently and was having chest pain.  Lexiscan Myoview was negative.  She went to the ED after this last month with chest pain but this was felt to be non anginal.  She also had an echo and Holter without significant abnormalities.  ***     She presents today and she is wearing a back brace because of pain. She recently broke some ribs. She said that for the past couple of weeks she's been having some palpitations. She'll feel her heart beating hard at night. She says that at times it might be in the 110 range or so. She's had take a couple of nitroglycerin in the past couple of weeks. She gets some chest discomfort that is hard for her to quantify or qualify. It comes on at rest. It might be like previous angina. She doesn't describe associated symptoms. It seems to happen with the increased heart rate. She's not describing any new PND or orthopnea.  She does have some shortness of breath which she's been noticing the increased heart rates as well. She's not describing neck or arm pain. She's not having associated nausea vomiting or diaphoresis. She's had no presyncope or syncope. The symptoms do go away sometimes spontaneously around at times with nitroglycerin.  Allergies  Allergen Reactions  . Contrast Media [Iodinated Diagnostic Agents]   . Morphine     agression  . Nitrofurantoin Other (See Comments)    Ruptured tendin   . Ciprofloxacin Rash    Current Outpatient Prescriptions  Medication Sig Dispense Refill  . amitriptyline (ELAVIL) 50 MG tablet Take 2 tablets (100 mg total) by mouth at bedtime. Take 2 tablets by mouth at bedtime (Patient taking differently: Take 50-100 mg by mouth at bedtime as needed for sleep. ) 60 tablet 3  . aspirin 325 MG tablet Take 325 mg by mouth daily as needed for mild pain.     Marland Kitchen atenolol (TENORMIN) 50 MG tablet TAKE ONE TABLET EVERY MORNING AND TAKE .5 TABLET EVERY EVENING 135  tablet 3  . baclofen (LIORESAL) 10 MG tablet Take 1 tablet (10 mg total) by mouth 3 (three) times daily. 90 each 2  . bumetanide (BUMEX) 1 MG tablet Take 1 mg by mouth 2 (two) times daily.    . cyanocobalamin (,VITAMIN B-12,) 1000 MCG/ML injection INJECT IM EVERY 2 WEEKS 2 mL 11  . dicyclomine (BENTYL) 10 MG capsule Take 10 mg by mouth daily as needed for spasms.     Marland Kitchen HYDROcodone-acetaminophen (NORCO/VICODIN) 5-325 MG tablet Take 1 tablet by mouth every 6 (six) hours as needed for moderate pain. 10 tablet 0  . ivermectin (STROMECTOL) 3 MG TABS tablet Take 5 tablets (15,000 mcg total) by mouth once. Repeat 10 days after taking first dose (Patient not taking: Reported on 09/14/2015) 10 tablet 0  . levothyroxine (SYNTHROID, LEVOTHROID) 112 MCG tablet TAKE ONE (1) TABLET EACH DAY 90 tablet 3  . meclizine (ANTIVERT) 25 MG tablet Take 25 mg by mouth daily as needed for dizziness or nausea.     . nitroGLYCERIN (NITROSTAT) 0.4 MG SL tablet Place 1 tablet (0.4 mg total) under the tongue as needed. (Patient taking differently: Place 0.4 mg under the tongue as needed for chest pain. ) 10 tablet 0  . oxyCODONE-acetaminophen (ENDOCET) 10-325 MG tablet Take 1 tablet by mouth every 4 (four) hours as needed for pain.    . potassium chloride SA (K-DUR,KLOR-CON)  20 MEQ tablet TAKE ONE TABLET BY MOUTH TWICE DAILY (Patient taking differently: TAKE ONE TABLET BY MOUTH DAILY) 180 tablet 3  . rOPINIRole (REQUIP) 1 MG tablet TAKE ONE (1) TABLET AT BEDTIME (Patient taking differently: TAKE ONE TABLET BY MOUTH EVERY NIGHT EVERY OTHER DAY AT BEDTIME/ OR AS NEEDED FOR RESTLESS LEGS) 90 tablet 3  . traZODone (DESYREL) 50 MG tablet Take 1-2 tablets (50-100 mg total) by mouth at bedtime as needed for sleep. (Patient not taking: Reported on 09/14/2015) 60 tablet 3   No current facility-administered medications for this visit.     Past Medical History:  Diagnosis Date  . Anemia, iron deficiency   . Anemia, vitamin B12  deficiency   . BACK PAIN, CHRONIC   . CAD (coronary artery disease)    S/P CABG in 1995. Last cath in 2003 demonstrated left main 20% stenosis, LAD 50-70% mid stenosis, circumflex 40-50% stenosis in the AV groove, 30% right coronary artery stenosis. Saphenous vein graft to the marginal was patent. A LIMA to the LAD was patent.  Marland Kitchen. CAROTID ARTERY STENOSIS   . Diabetes mellitus    Diet controlled since weight loss  . GERD (gastroesophageal reflux disease)   . Hypothyroidism   . Mitral valve disorders   . Obstructive sleep apnea on CPAP    intol of CPAP - noncompliant  . Osteopenia   . Pancreatitis   . 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 Tobacco abuse    Quit 02/2010    Past Surgical History:  Procedure Laterality Date  . ABDOMINAL HYSTERECTOMY    . APPENDECTOMY    . BREAST LUMPECTOMY    . CARDIAC CATHETERIZATION  2003  . CORONARY ARTERY BYPASS GRAFT  1995   [place]  . LUMBAR FUSION    . SAPHENOUS VEIN GRAFT RESECTION  2003  . TONSILLECTOMY      ROS:  Right knee pain. Otherwise as stated in the HPI and negative for all other systems.  PHYSICAL EXAM There were no vitals taken for this visit. GENERAL:  Somewhat frail appearing HEENT:  Pupils equal round and reactive, fundi not visualized, oral mucosa unremarkable NECK:  No jugular venous distention, waveform within normal limits, carotid upstroke brisk and symmetric, no bruits, no thyromegaly LUNGS:  Clear to auscultation bilaterally BACK:  No CVA tenderness CHEST:  Well healed sternotomy scar. HEART:  PMI not displaced or sustained,S1 and S2 within normal limits, no S3, no S4, no clicks, no rubs, apical holosystolic murmur ABD:  Flat, positive bowel sounds normal in frequency in pitch, no bruits, no rebound, no guarding, no midline pulsatile mass, no hepatomegaly, no splenomegaly, Note the exam is somewhat compromised because of a back brace EXT:  2 plus pulses throughout, bilateral mild  lower extremity edema, no cyanosis no clubbing   EKG:  Sinus rhythm, rate ***, axis leftward  intervals within normal limits, no acute ST-T wave changes.  10/14/2015   ASSESSMENT AND PLAN  CAD -  She has some new chest pain with atypical features mostly.  I will screen with Lexiscan Myoview.  TOBACCO ABUSE -  She is using electronic cigarettes. I advised against long term use of this.    CAROTID ARTERY STENOSIS -  She had minimal plaque in June.    MITRAL VALVE DISORDERS -  She had mild to mod MR in 2013.  Echo last month had mild MR and normal EF.  No further imaging is indicated.   Hyperlipidemia -  She won't take statins.  I will defer follow up to her primary provider.   Hypertension -  The blood pressure is elevated but this is unusual. She can follow this at home.    Palpitations - ECG demonstrated no significant arrhythmias.

## 2015-10-16 ENCOUNTER — Ambulatory Visit: Payer: Commercial Managed Care - HMO | Admitting: Cardiology

## 2015-11-04 ENCOUNTER — Ambulatory Visit: Payer: Medicare HMO | Admitting: Family Medicine

## 2015-11-05 ENCOUNTER — Encounter: Payer: Self-pay | Admitting: Family Medicine

## 2015-11-07 ENCOUNTER — Ambulatory Visit (INDEPENDENT_AMBULATORY_CARE_PROVIDER_SITE_OTHER): Payer: Commercial Managed Care - HMO | Admitting: Family Medicine

## 2015-11-07 ENCOUNTER — Encounter: Payer: Self-pay | Admitting: Family Medicine

## 2015-11-07 VITALS — BP 138/64 | HR 93 | Temp 97.0°F | Ht 66.0 in | Wt 163.2 lb

## 2015-11-07 DIAGNOSIS — G8929 Other chronic pain: Secondary | ICD-10-CM

## 2015-11-07 DIAGNOSIS — E119 Type 2 diabetes mellitus without complications: Secondary | ICD-10-CM | POA: Diagnosis not present

## 2015-11-07 DIAGNOSIS — M549 Dorsalgia, unspecified: Secondary | ICD-10-CM

## 2015-11-07 NOTE — Progress Notes (Signed)
   HPI  Patient presents today here for follow-up of her medical conditions.  Chronic back pain Treated by her orthopedic surgeon with chronic narcotics, she is managed well. She states that she's been using Elavil for years to help with her pain. She has cut down by 50% to 50 mg a night which is still helping quite a bit. Trazodone caused rash and breakout.  Type 2 diabetes No meds, not checking  PMH: Smoking status noted ROS: Per HPI  Objective: BP 138/64   Pulse 93   Temp 97 F (36.1 C) (Oral)   Ht 5\' 6"  (1.676 m)   Wt 163 lb 3.2 oz (74 kg)   BMI 26.34 kg/m  Gen: NAD, alert, cooperative with exam HEENT: NCAT CV: RRR, good S1/S2, no murmur Resp: CTABL, no wheezes, non-labored Ext: No edema, warm Neuro: Alert and oriented, No gross deficits  Assessment and plan:  # Chronic back pain Patient is treated well by her orthopedic surgeon with chronic narcotics She's been using Elavil for years as well, we tried to discontinue this due to increased risk of falls with the elderly. She did not tolerate trazodone but she has cut back on Elavil to 50 mg every night.  # Type 2 diabetes Diet controlled, plan to check A1c every 4-6 months.   Murtis SinkSam Dixon Luczak, MD [facility] 11/07/2015, 2:28 PM

## 2015-11-07 NOTE — Patient Instructions (Signed)
Great to see you!  Come back in 4 months unless you need us sooner,    

## 2015-11-11 ENCOUNTER — Other Ambulatory Visit: Payer: Self-pay | Admitting: Family Medicine

## 2015-12-10 ENCOUNTER — Other Ambulatory Visit: Payer: Self-pay | Admitting: Family Medicine

## 2016-02-07 ENCOUNTER — Other Ambulatory Visit: Payer: Self-pay | Admitting: Family Medicine

## 2016-03-06 ENCOUNTER — Other Ambulatory Visit: Payer: Self-pay | Admitting: Family Medicine

## 2016-05-04 ENCOUNTER — Other Ambulatory Visit: Payer: Self-pay | Admitting: Family Medicine

## 2016-05-04 NOTE — Telephone Encounter (Signed)
Patient last seen September 2017, please advise

## 2016-06-03 ENCOUNTER — Other Ambulatory Visit: Payer: Self-pay | Admitting: Family Medicine

## 2016-06-25 ENCOUNTER — Other Ambulatory Visit: Payer: Self-pay | Admitting: Family Medicine

## 2016-06-25 NOTE — Telephone Encounter (Signed)
Needs appointment to be seen

## 2016-06-26 NOTE — Telephone Encounter (Signed)
Attempted to contact patient number not in-service

## 2016-09-02 ENCOUNTER — Other Ambulatory Visit: Payer: Self-pay | Admitting: Family Medicine

## 2016-09-03 NOTE — Telephone Encounter (Signed)
Last seen 11/07/15  Dr Ermalinda MemosBradshaw   Last thyroid level 04/03/10

## 2016-10-08 ENCOUNTER — Other Ambulatory Visit: Payer: Self-pay | Admitting: Family Medicine

## 2016-10-09 ENCOUNTER — Other Ambulatory Visit: Payer: Self-pay | Admitting: Cardiology

## 2016-10-25 ENCOUNTER — Other Ambulatory Visit: Payer: Self-pay | Admitting: Family Medicine

## 2016-11-05 ENCOUNTER — Other Ambulatory Visit: Payer: Self-pay | Admitting: Cardiology

## 2016-11-05 ENCOUNTER — Other Ambulatory Visit: Payer: Self-pay | Admitting: *Deleted

## 2016-11-05 MED ORDER — ATENOLOL 50 MG PO TABS: 75.0000 mg | ORAL_TABLET | Freq: Every day | ORAL | 0 refills | Status: DC

## 2016-11-09 ENCOUNTER — Other Ambulatory Visit: Payer: Self-pay | Admitting: *Deleted

## 2016-11-09 MED ORDER — ATENOLOL 50 MG PO TABS: 75.0000 mg | ORAL_TABLET | Freq: Every day | ORAL | 0 refills | Status: DC

## 2016-11-09 NOTE — Telephone Encounter (Signed)
Pharmacy requests ninety days. 

## 2016-11-12 ENCOUNTER — Other Ambulatory Visit: Payer: Self-pay | Admitting: Family Medicine

## 2016-11-12 MED ORDER — LISINOPRIL 10 MG PO TABS: 10.0000 mg | ORAL_TABLET | Freq: Every day | ORAL | 2 refills | Status: DC

## 2016-11-12 MED ORDER — LEVOTHYROXINE SODIUM 112 MCG PO TABS: ORAL_TABLET | ORAL | 2 refills | Status: DC

## 2016-11-12 NOTE — Telephone Encounter (Signed)
Refills sent

## 2016-12-17 ENCOUNTER — Other Ambulatory Visit: Payer: Self-pay | Admitting: Cardiology

## 2017-01-09 ENCOUNTER — Other Ambulatory Visit: Payer: Self-pay | Admitting: Cardiology

## 2017-02-17 ENCOUNTER — Other Ambulatory Visit: Payer: Self-pay | Admitting: Cardiology

## 2017-02-18 IMAGING — CT CT HEAD W/O CM
3 of 4 series · 16 of 47 positions shown, 19 images · non-contrast
Comparison: None.

CLINICAL DATA: Patient with chest pain, headache and right arm
pain.

EXAM:
CT HEAD WITHOUT CONTRAST
TECHNIQUE: Contiguous axial images were obtained from the base of the skull
through the vertex without intravenous contrast.

[Series 2: head w/o · axial · non-contrast · 0.44mm/px · z∈[-60,+84]mm · 10 of 43 slices shown, 13 images]
[im 4/43  brain]
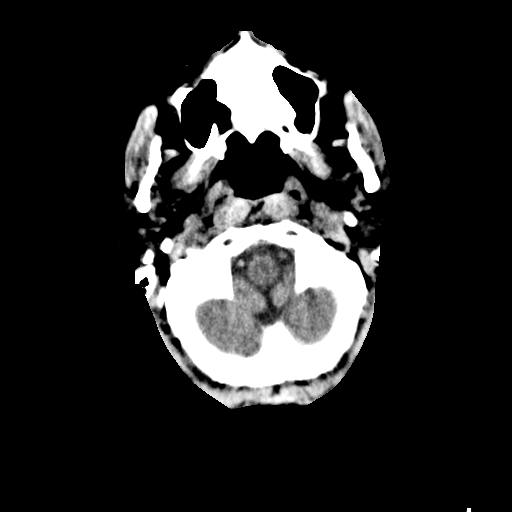
[im 4/43  bone]
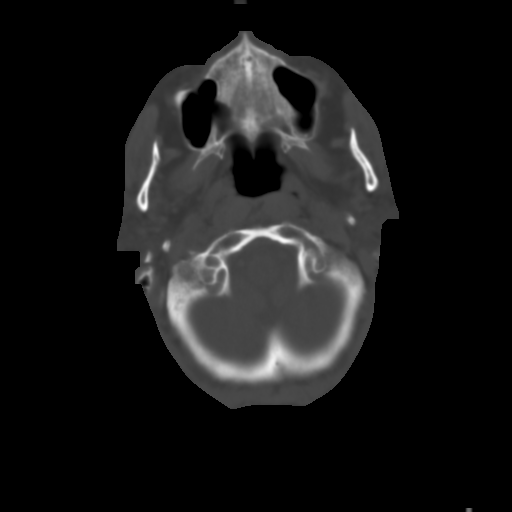
[im 7/43  brain]
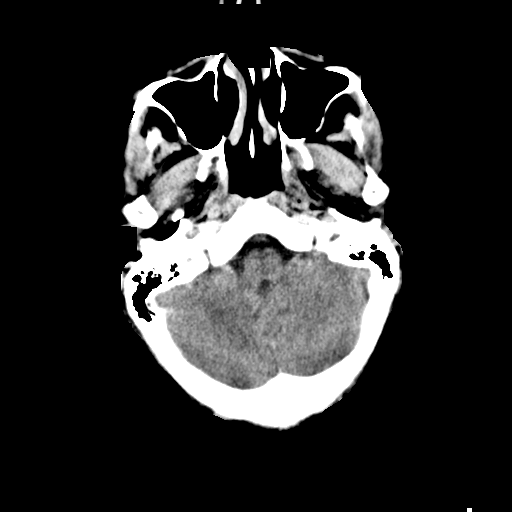
[im 13/43  brain]
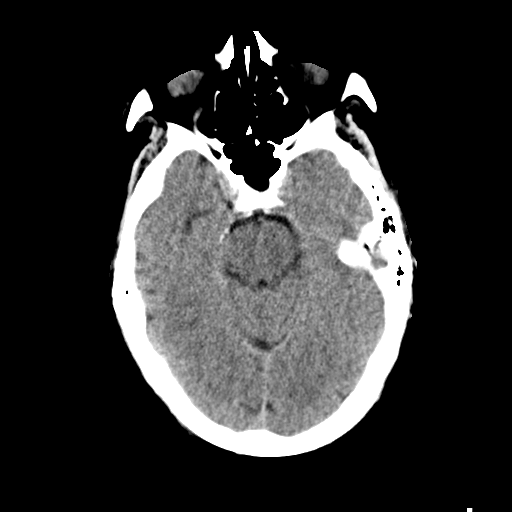
[im 16/43  brain]
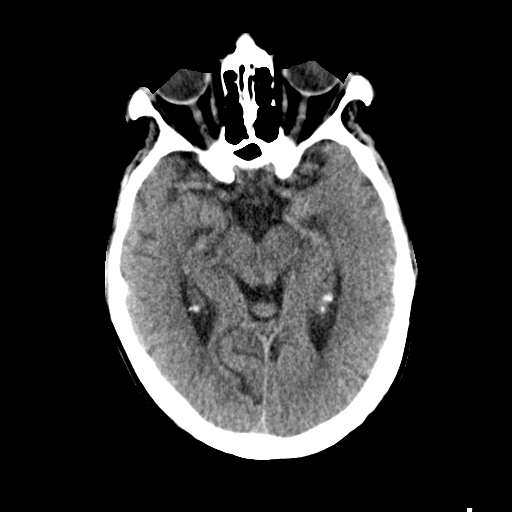
[im 19/43  brain]
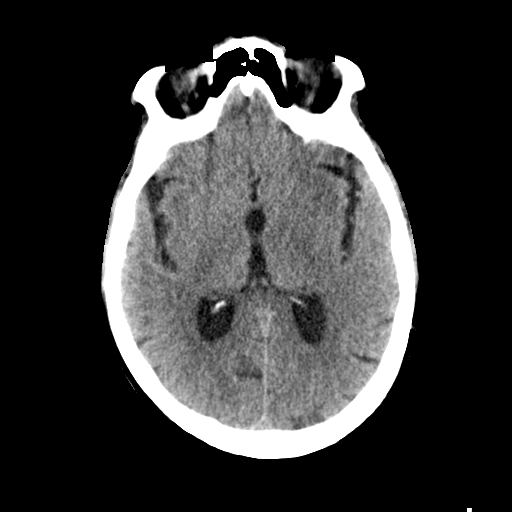
[im 19/43  bone]
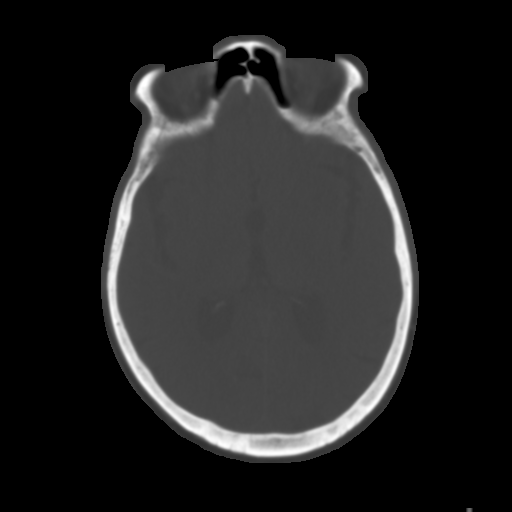
[im 25/43  brain]
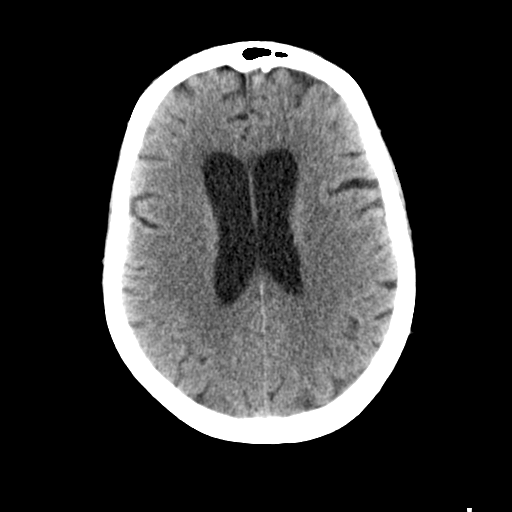
[im 28/43  brain]
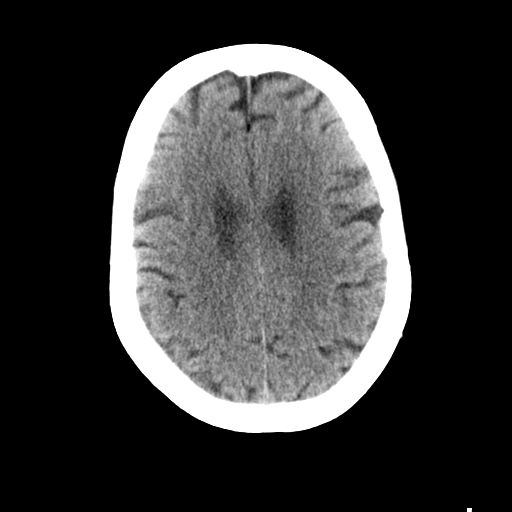
[im 31/43  brain]
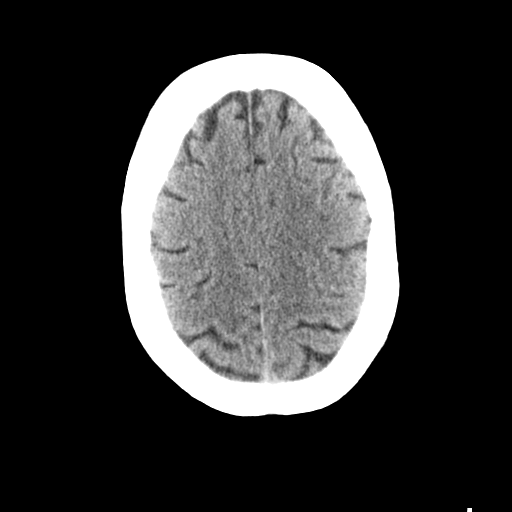
[im 37/43  brain]
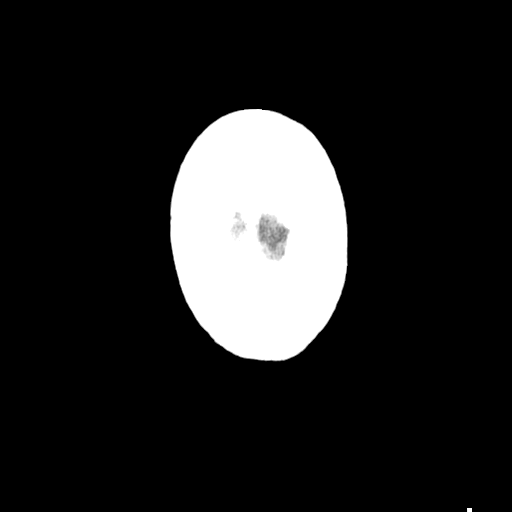
[im 37/43  bone]
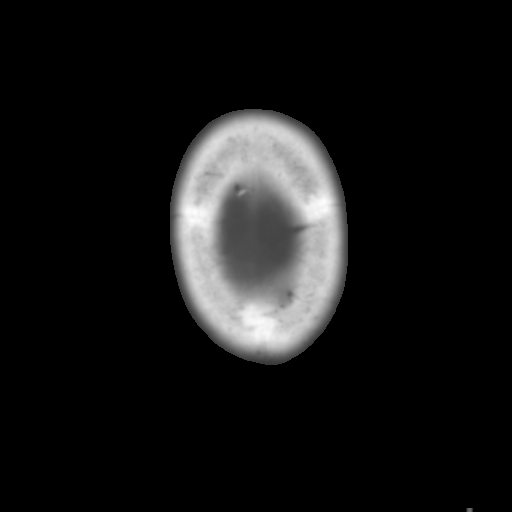
[im 40/43  brain]
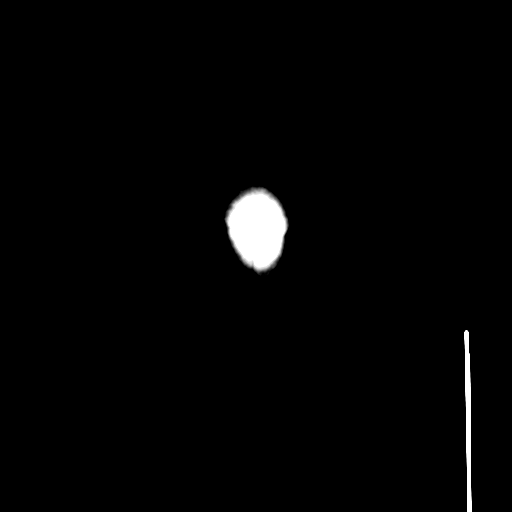

[Series 4: coronal · coronal · 0.33mm/px · 3 of 70 slices shown]
[im 24/70  brain]
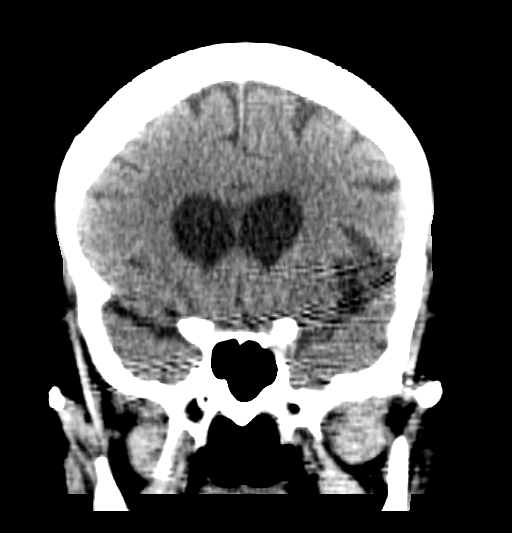
[im 31/70  brain]
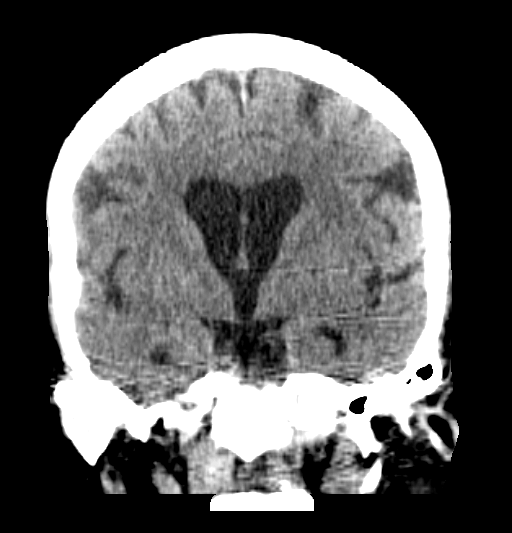
[im 39/70  brain]
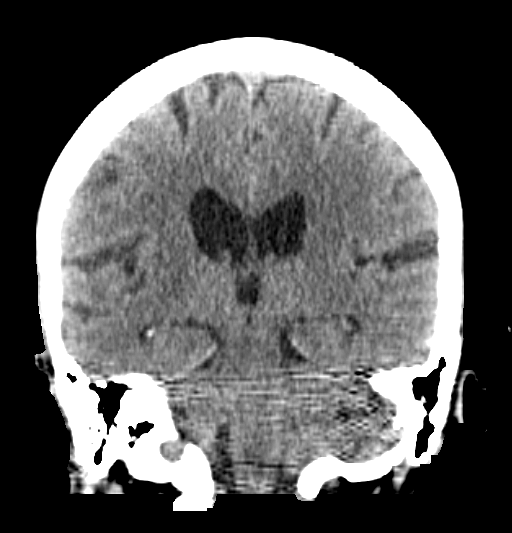

[Series 5: sagittal · sagittal · 0.34mm/px · 3 of 54 slices shown]
[im 18/54  brain]
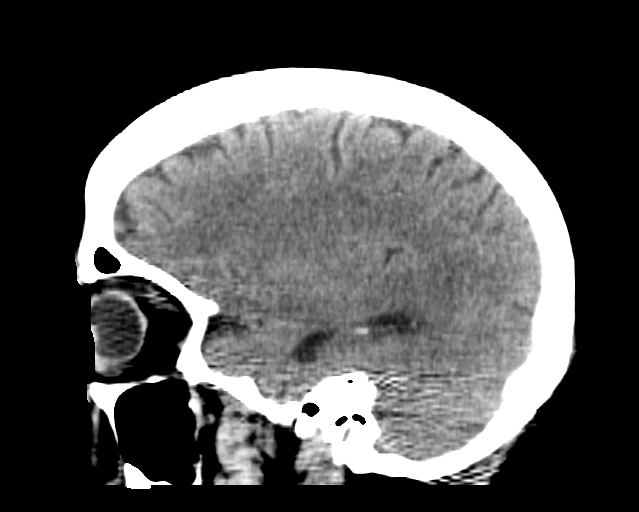
[im 27/54  brain]
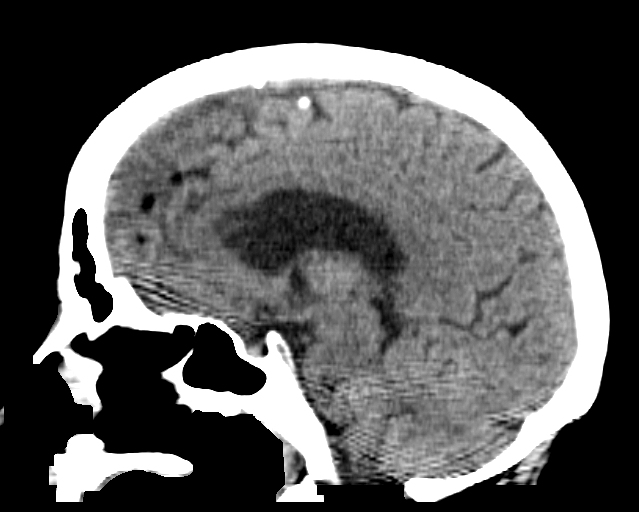
[im 36/54  brain]
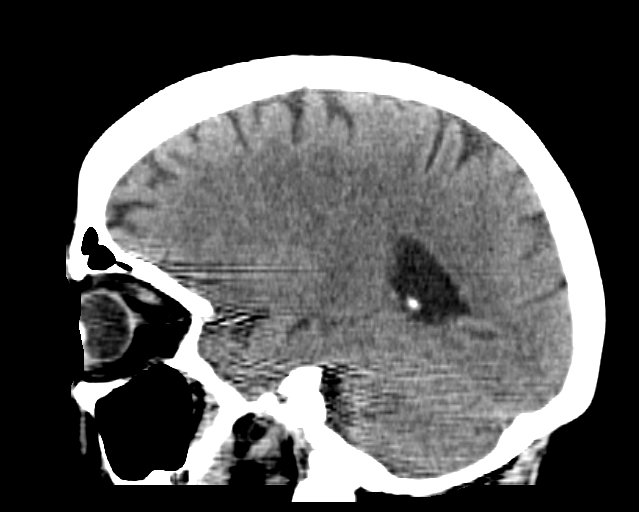

[16 of 47 positions shown; findings below may reference images not displayed]

FINDINGS: Ventricles and sulci are appropriate for patient's age. No evidence
for acute cortically based infarct, intracranial hemorrhage, mass
lesion or mass-effect. Orbits are unremarkable. The paranasal
sinuses are well aerated. Mastoid air cells unremarkable. Calvarium
is intact.
IMPRESSION: No acute intracranial process.

## 2017-02-18 IMAGING — DX DG CHEST 2V
2 series · 2 of 2 positions shown · non-contrast
Comparison: None.

CLINICAL DATA: Chest pain and headache today with left arm pain.

EXAM:
CHEST  2 VIEW

[chest pa]
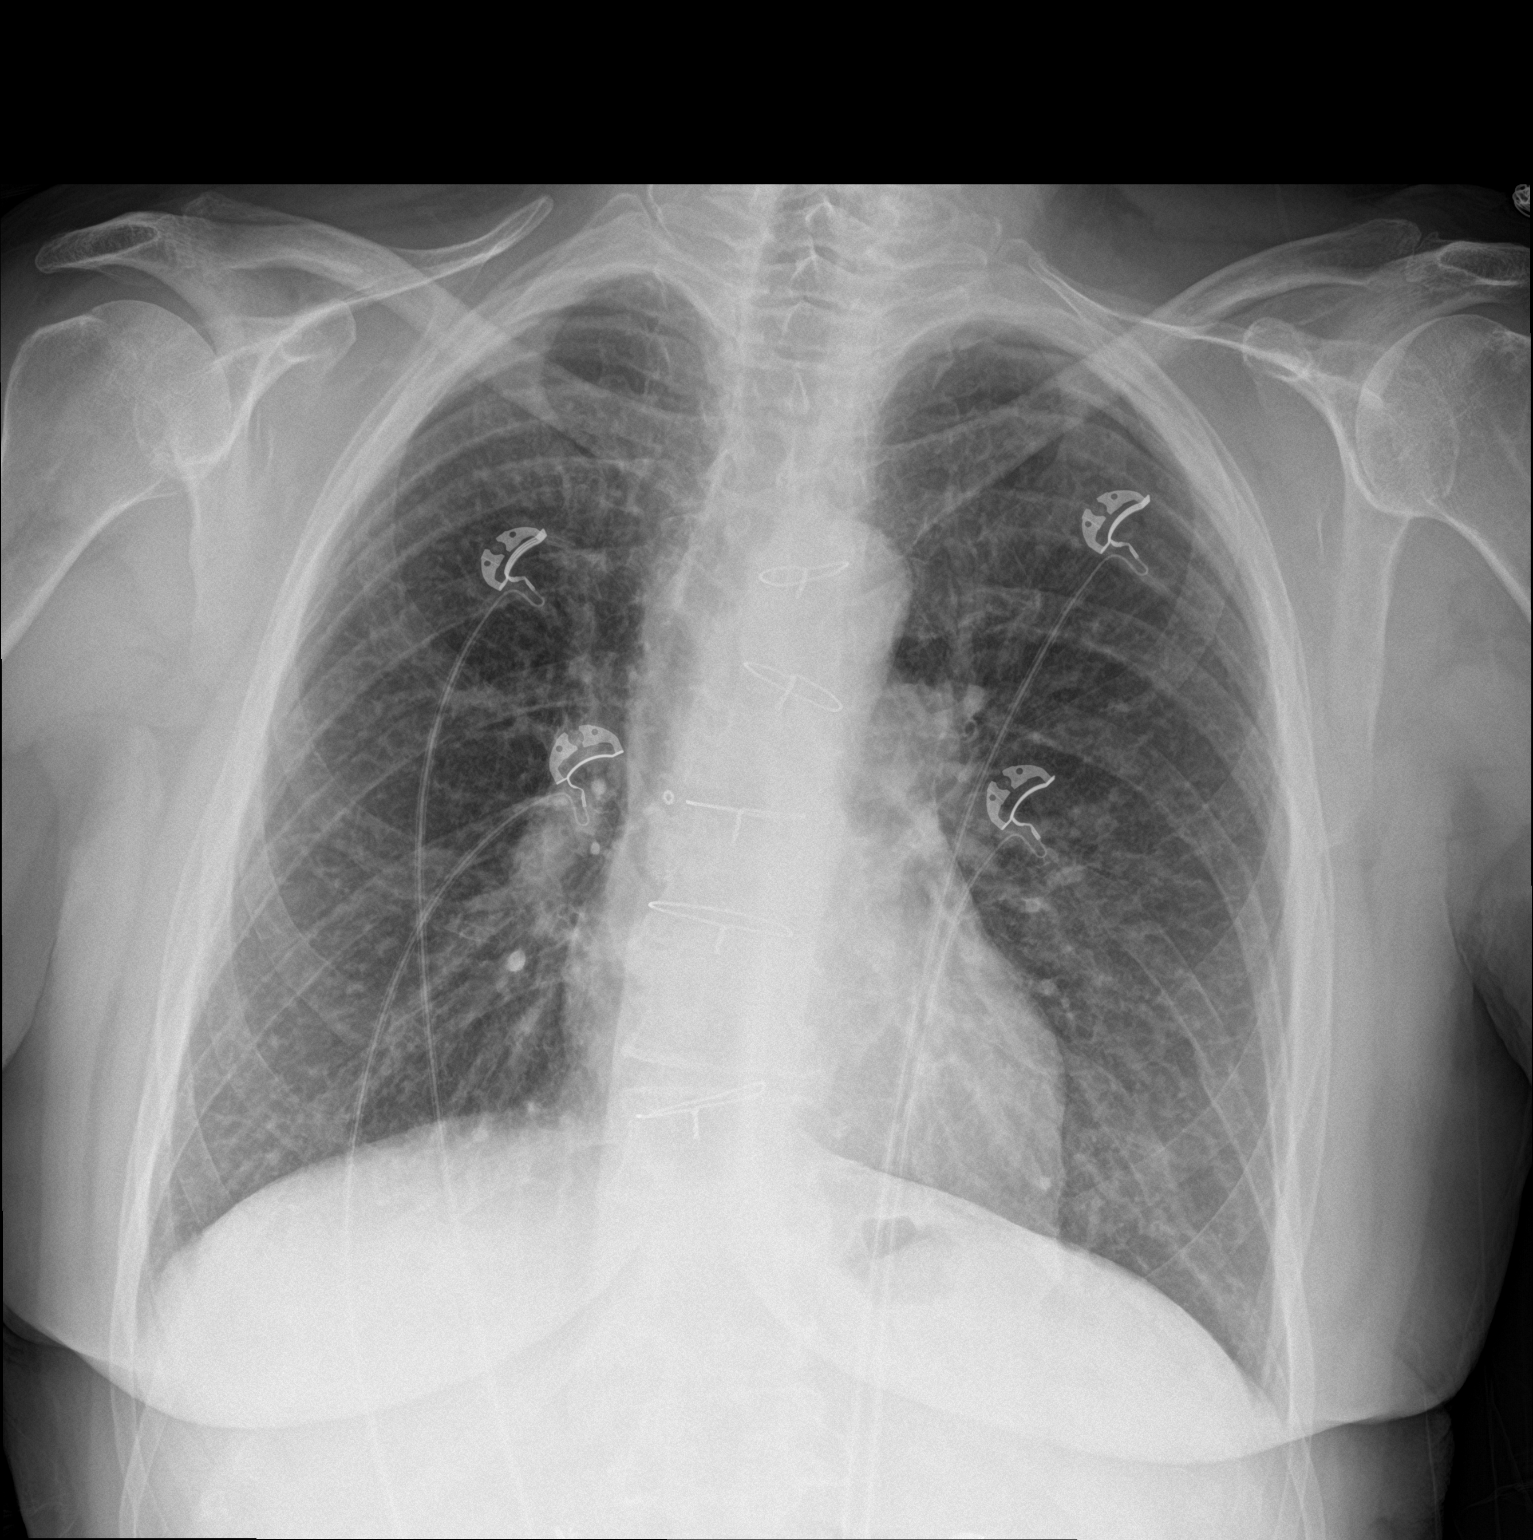

[chest lat]
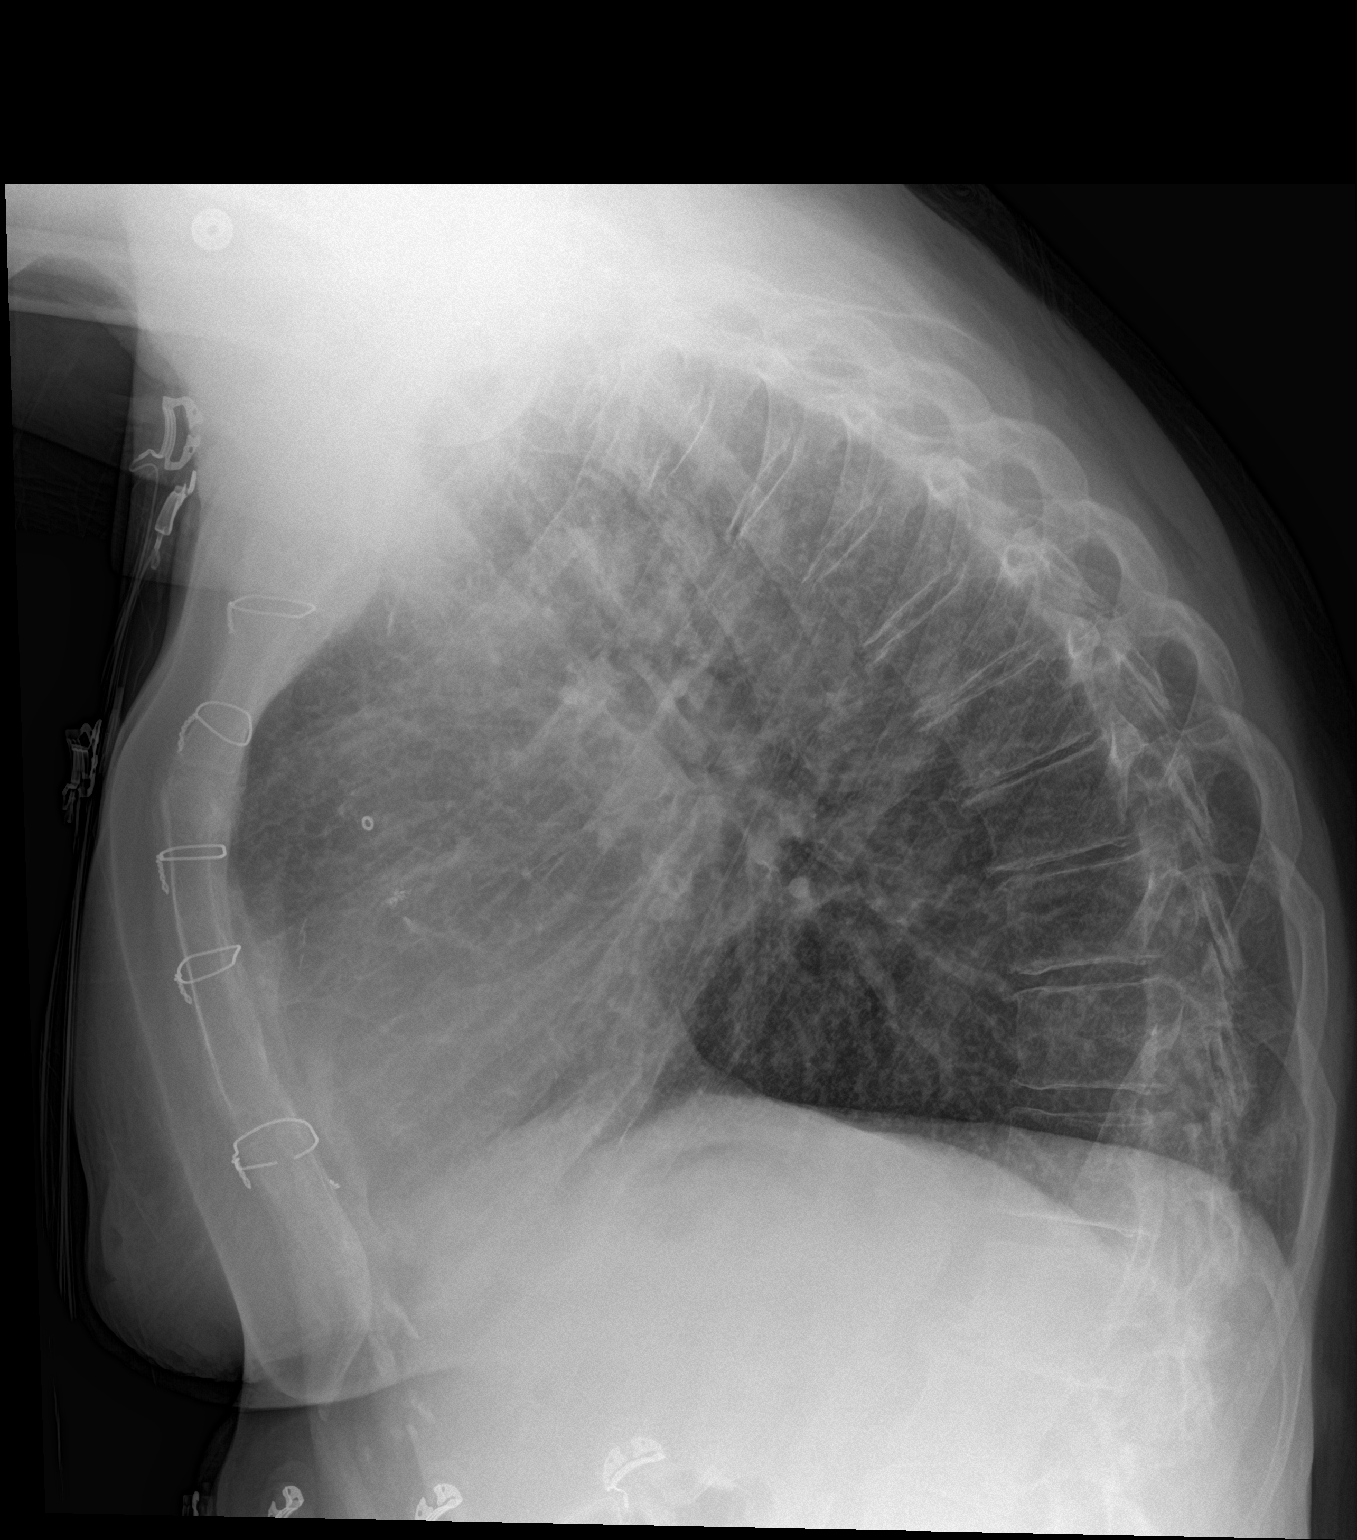

[2 of 2 positions shown; findings below may reference images not displayed]

FINDINGS: Sternotomy wires are present. Lungs are well inflated without focal
consolidation or effusion. Cardiomediastinal silhouette is within
normal. There are mild degenerate changes of the spine. Old left
posterior eighth rib fracture.
IMPRESSION: No acute cardiopulmonary disease.

## 2017-03-11 ENCOUNTER — Other Ambulatory Visit: Payer: Self-pay | Admitting: Cardiology

## 2017-03-11 NOTE — Telephone Encounter (Signed)
REFILL 

## 2017-04-04 ENCOUNTER — Other Ambulatory Visit: Payer: Self-pay | Admitting: Family Medicine

## 2017-04-04 ENCOUNTER — Other Ambulatory Visit: Payer: Self-pay | Admitting: Cardiology

## 2017-04-05 NOTE — Telephone Encounter (Signed)
REFILL 

## 2017-04-27 NOTE — Progress Notes (Deleted)
HPI The patient presents for followup of her known coronary disease.  ***   She presents today and she is wearing a back brace because of pain. She recently broke some ribs. She said that for the past couple of weeks she's been having some palpitations. She'll feel her heart beating hard at night. She says that at times it might be in the 110 range or so. She's had take a couple of nitroglycerin in the past couple of weeks. She gets some chest discomfort that is hard for her to quantify or qualify. It comes on at rest. It might be like previous angina. She doesn't describe associated symptoms. It seems to happen with the increased heart rate. She's not describing any new PND or orthopnea.  She does have some shortness of breath which she's been noticing the increased heart rates as well. She's not describing neck or arm pain. She's not having associated nausea vomiting or diaphoresis. She's had no presyncope or syncope. The symptoms do go away sometimes spontaneously around at times with nitroglycerin.  Allergies  Allergen Reactions  . Contrast Media [Iodinated Diagnostic Agents]   . Morphine     agression  . Nitrofurantoin Other (See Comments)    Ruptured tendin   . Ciprofloxacin Rash  . Trazodone And Nefazodone Rash    Current Outpatient Medications  Medication Sig Dispense Refill  . amitriptyline (ELAVIL) 50 MG tablet Take 2 tablets (100 mg total) by mouth at bedtime. Take 2 tablets by mouth at bedtime (Patient taking differently: Take 50 mg by mouth at bedtime as needed for sleep. ) 60 tablet 3  . aspirin 325 MG tablet Take 325 mg by mouth daily as needed for mild pain.     Marland Kitchen. atenolol (TENORMIN) 50 MG tablet Take 1.5 tablets (75 mg total) by mouth daily. KEEP OV. 45 tablet 0  . baclofen (LIORESAL) 10 MG tablet TAKE 1 TABLET BY MOUTH THREE TIMES DAILY. 90 tablet 0  . bumetanide (BUMEX) 1 MG tablet TAKE (1) TABLET BY MOUTH TWICE A DAY IF NEEDED. 60 tablet 0  . cyanocobalamin (,VITAMIN  B-12,) 1000 MCG/ML injection INJECT 1 ML IM EVERY 2 WEEKS. 2 mL 0  . dicyclomine (BENTYL) 10 MG capsule Take 10 mg by mouth daily as needed for spasms.     Marland Kitchen. ivermectin (STROMECTOL) 3 MG TABS tablet Take 5 tablets (15,000 mcg total) by mouth once. Repeat 10 days after taking first dose 10 tablet 0  . levothyroxine (SYNTHROID, LEVOTHROID) 112 MCG tablet TAKE (1) TABLET BY MOUTH DAILY 30 tablet 2  . lisinopril (PRINIVIL,ZESTRIL) 10 MG tablet Take 1 tablet (10 mg total) by mouth daily. 30 tablet 2  . meclizine (ANTIVERT) 25 MG tablet Take 25 mg by mouth daily as needed for dizziness or nausea.     . nitroGLYCERIN (NITROSTAT) 0.4 MG SL tablet Place 1 tablet (0.4 mg total) under the tongue as needed. (Patient taking differently: Place 0.4 mg under the tongue as needed for chest pain. ) 10 tablet 0  . potassium chloride SA (K-DUR,KLOR-CON) 20 MEQ tablet TAKE ONE TABLET BY MOUTH TWICE DAILY (Patient taking differently: TAKE ONE TABLET BY MOUTH DAILY) 180 tablet 3  . rOPINIRole (REQUIP) 1 MG tablet TAKE ONE (1) TABLET AT BEDTIME (Patient taking differently: TAKE ONE TABLET BY MOUTH EVERY NIGHT EVERY OTHER DAY AT BEDTIME/ OR AS NEEDED FOR RESTLESS LEGS) 90 tablet 3   No current facility-administered medications for this visit.     Past Medical History:  Diagnosis  Date  . Anemia, iron deficiency   . Anemia, vitamin B12 deficiency   . BACK PAIN, CHRONIC   . CAD (coronary artery disease)    S/P CABG in 1995. Last cath in 2003 demonstrated left main 20% stenosis, LAD 50-70% mid stenosis, circumflex 40-50% stenosis in the AV groove, 30% right coronary artery stenosis. Saphenous vein graft to the marginal was patent. A LIMA to the LAD was patent.  Marland Kitchen CAROTID ARTERY STENOSIS   . Diabetes mellitus    Diet controlled since weight loss  . GERD (gastroesophageal reflux disease)   . Hypothyroidism   . Mitral valve disorders   . Obstructive sleep apnea on CPAP    intol of CPAP - noncompliant  . Osteopenia   .  Pancreatitis   . Pseudoaneurysm [facility])    Requiring repair following last cath  . Tobacco abuse    Quit 02/2010    Past Surgical History:  Procedure Laterality Date  . ABDOMINAL HYSTERECTOMY    . APPENDECTOMY    . BREAST LUMPECTOMY    . CARDIAC CATHETERIZATION  2003  . CORONARY ARTERY BYPASS GRAFT  1995   [place], [place]  . LUMBAR FUSION    . SAPHENOUS VEIN GRAFT RESECTION  2003  . TONSILLECTOMY      ROS:  Back pain.  ***  . Otherwise as stated in the HPI and negative for all other systems.  PHYSICAL EXAM There were no vitals taken for this visit.  GENERAL:  Well appearing NECK:  No jugular venous distention, waveform within normal limits, carotid upstroke brisk and symmetric, no bruits, no thyromegaly LUNGS:  Clear to auscultation bilaterally CHEST:  Unremarkable HEART:  PMI not displaced or sustained,S1 and S2 within normal limits, no S3, no S4, no clicks, no rubs, *** murmurs ABD:  Flat, positive bowel sounds normal in frequency in pitch, no bruits, no rebound, no guarding, no midline pulsatile mass, no hepatomegaly, no splenomegaly EXT:  2 plus pulses throughout, no edema, no cyanosis no clubbing     GENERAL:  Somewhat frail appearing HEENT:  Pupils equal round and reactive, fundi not visualized, oral mucosa unremarkable NECK:  No jugular venous distention, waveform within normal limits, carotid upstroke brisk and symmetric, no bruits, no thyromegaly LUNGS:  Clear to auscultation bilaterally BACK:  No CVA tenderness CHEST:  Well healed sternotomy scar. HEART:  PMI not displaced or sustained,S1 and S2 within normal limits, no S3, no S4, no clicks, no rubs, apical holosystolic murmur ABD:  Flat, positive bowel sounds normal in frequency in pitch, no bruits, no rebound, no guarding, no midline pulsatile mass, no hepatomegaly, no splenomegaly, Note the exam is somewhat compromised because of a back brace EXT:  2 plus pulses throughout, bilateral mild lower extremity edema, no  cyanosis no clubbing   EKG:  Sinus rhythm, rate *** axis leftward  intervals within normal limits, no acute ST-T wave changes.  04/27/2017   ASSESSMENT AND PLAN  CAD -  The patient had a negative Lexiscan Myoview in 2017.  ***She has some new chest pain with atypical features mostly.  I will screen with Lexiscan Myoview.  TOBACCO ABUSE -  ***  She is using electronic cigarettes. I advised against long term use of this.     CAROTID ARTERY STENOSIS -  She had minimal plaque in 2017.  ***  I will arrange follow-up. She missed the last test.    MITRAL VALVE DISORDERS -  There was no MR in 2017 with normal EF. She had  mild to mod MR in 2013.  I will repeat an echo.    Hyperlipidemia -  She won't take statins.   ***  I will defer follow up to her primary provider.    Hypertension -  ***   The blood pressure is elevated but this is unusual. She can follow this at home.    Palpitations - She had no arrhythmia on Holter when she was last seen.  ***   I will check a 48 hour Holter

## 2017-04-28 ENCOUNTER — Emergency Department (HOSPITAL_COMMUNITY)
Admission: EM | Admit: 2017-04-28 | Discharge: 2017-04-29 | Disposition: A | Discharge status: Home / Self Care | Payer: Medicare Other | Attending: Emergency Medicine | Admitting: Emergency Medicine

## 2017-04-28 ENCOUNTER — Emergency Department (HOSPITAL_COMMUNITY): Payer: Medicare Other

## 2017-04-28 ENCOUNTER — Encounter (HOSPITAL_COMMUNITY): Payer: Self-pay | Admitting: Emergency Medicine

## 2017-04-28 ENCOUNTER — Other Ambulatory Visit: Payer: Self-pay

## 2017-04-28 ENCOUNTER — Ambulatory Visit: Payer: Medicare Other | Admitting: Cardiology

## 2017-04-28 DIAGNOSIS — Z79899 Other long term (current) drug therapy: Secondary | ICD-10-CM | POA: Insufficient documentation

## 2017-04-28 DIAGNOSIS — E039 Hypothyroidism, unspecified: Secondary | ICD-10-CM | POA: Diagnosis not present

## 2017-04-28 DIAGNOSIS — Z87891 Personal history of nicotine dependence: Secondary | ICD-10-CM | POA: Insufficient documentation

## 2017-04-28 DIAGNOSIS — Z951 Presence of aortocoronary bypass graft: Secondary | ICD-10-CM | POA: Diagnosis not present

## 2017-04-28 DIAGNOSIS — I2510 Atherosclerotic heart disease of native coronary artery without angina pectoris: Secondary | ICD-10-CM | POA: Diagnosis not present

## 2017-04-28 DIAGNOSIS — E119 Type 2 diabetes mellitus without complications: Secondary | ICD-10-CM | POA: Diagnosis not present

## 2017-04-28 DIAGNOSIS — R1013 Epigastric pain: Secondary | ICD-10-CM | POA: Diagnosis present

## 2017-04-28 LAB — COMPREHENSIVE METABOLIC PANEL
ALK PHOS: 58 U/L (ref 38–126)
ALT: 13 U/L — ABNORMAL LOW (ref 14–54)
AST: 20 U/L (ref 15–41)
Albumin: 4.3 g/dL (ref 3.5–5.0)
Anion gap: 13 (ref 5–15)
BILIRUBIN TOTAL: 0.4 mg/dL (ref 0.3–1.2)
BUN: 14 mg/dL (ref 6–20)
CALCIUM: 9.7 mg/dL (ref 8.9–10.3)
CO2: 26 mmol/L (ref 22–32)
Chloride: 100 mmol/L — ABNORMAL LOW (ref 101–111)
Creatinine, Ser: 0.64 mg/dL (ref 0.44–1.00)
Glucose, Bld: 99 mg/dL (ref 65–99)
Potassium: 3.6 mmol/L (ref 3.5–5.1)
Sodium: 139 mmol/L (ref 135–145)
Total Protein: 7.4 g/dL (ref 6.5–8.1)

## 2017-04-28 LAB — CBC
HCT: 43.1 % (ref 36.0–46.0)
Hemoglobin: 13.7 g/dL (ref 12.0–15.0)
MCH: 30.9 pg (ref 26.0–34.0)
MCHC: 31.8 g/dL (ref 30.0–36.0)
MCV: 97.1 fL (ref 78.0–100.0)
Platelets: 303 10*3/uL (ref 150–400)
RBC: 4.44 MIL/uL (ref 3.87–5.11)
RDW: 13 % (ref 11.5–15.5)
WBC: 8.8 10*3/uL (ref 4.0–10.5)

## 2017-04-28 LAB — LIPASE, BLOOD: LIPASE: 22 U/L (ref 11–51)

## 2017-04-28 MED ORDER — GI COCKTAIL ~~LOC~~
30.0000 mL | Freq: Once | ORAL | Status: AC
  Administered 2017-04-28: 30 mL via ORAL
  Filled 2017-04-28: qty 30

## 2017-04-28 NOTE — ED Provider Notes (Signed)
Emergency Department Provider Note   I have reviewed the triage vital signs and the nursing notes.   HISTORY  Chief Complaint Abdominal Pain   HPI Amber Bean is a 77 y.o. female presents to the ED with 2-3 years of epigastric abdominal pain worsening this evening. Patient reports having similar pain in the past with flares but denies prior w/u. Has not taken OTC medication. She ultimately called 911 for transport to the ED. Denies any CP or SOB. No modifying factors. No radiation of symptoms. Denies any fever or chills. No vomiting or diarrhea. No significant change with food.  Past Medical History:  Diagnosis Date  . Anemia, iron deficiency   . Anemia, vitamin B12 deficiency   . BACK PAIN, CHRONIC   . CAD (coronary artery disease)    S/P CABG in 1995. Last cath in 2003 demonstrated left main 20% stenosis, LAD 50-70% mid stenosis, circumflex 40-50% stenosis in the AV groove, 30% right coronary artery stenosis. Saphenous vein graft to the marginal was patent. A LIMA to the LAD was patent.  Marland Kitchen CAROTID ARTERY STENOSIS   . Diabetes mellitus    Diet controlled since weight loss  . GERD (gastroesophageal reflux disease)   . Hypothyroidism   . Mitral valve disorders(424.0)   . Obstructive sleep apnea on CPAP    intol of CPAP - noncompliant  . Osteopenia   . Pancreatitis   . 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 Tobacco abuse    Quit 02/2010    Patient Active Problem List   Diagnosis Date Noted  . Hyperlipidemia 08/06/2010  . DYSPHAGIA UNSPECIFIED 04/03/2010  . ANEMIA-IRON DEFICIENCY 10/07/2009  . GERD 10/07/2009  . KIDNEY DISEASE 10/07/2009  . Chronic back pain 10/07/2009  . TOBACCO ABUSE 02/27/2009  . MITRAL VALVE DISORDERS 02/27/2009  . CAROTID ARTERY STENOSIS 02/27/2009  . HYPOTHYROIDISM 12/31/2008  . T2DM (type 2 diabetes mellitus) (HCC) 12/31/2008  . CAD (coronary artery disease) 12/31/2008  . PANCREATITIS 12/31/2008  . SLEEP APNEA  12/31/2008    Past Surgical History:  Procedure Laterality Date  . ABDOMINAL HYSTERECTOMY    . APPENDECTOMY    . BREAST LUMPECTOMY    . CARDIAC CATHETERIZATION  2003  . CORONARY ARTERY BYPASS GRAFT  1995   [place], [place]  . LUMBAR FUSION    . SAPHENOUS VEIN GRAFT RESECTION  2003  . TONSILLECTOMY      Current Outpatient Rx  . Order #: [id number] Class: Historical Med  . Order #: [id number] Class: Historical Med  . Order #: [id number] Class: Normal  . Order #: [id number] Class: Normal  . Order #: [id number] Class: Normal  . Order #: [id number] Class: Historical Med  . Order #: [id number] Class: Normal  . Order #: [id number] Class: Normal  . Order #: [id number] Class: Historical Med  . Order #: [id number] Class: Normal  . Order #: [id number] Class: Normal  . Order #: [id number] Class: Normal  . Order #: [id number] Class: Print  . Order #: [id number] Class: Print    Allergies Contrast media [iodinated diagnostic agents]; Morphine; Nitrofurantoin; Ciprofloxacin; and Trazodone and nefazodone  Family History  Problem Relation Age of Onset  . Hypertension Father   . Heart disease Mother   . Stroke Mother   . Heart disease Brother   . Stroke Maternal Grandfather   . Hypertension Other   . Hyperlipidemia Other   . Stroke Other     Social History Social History   Tobacco Use  . Smoking status: Former Smoker    Packs/day: 1.00  Types: Cigarettes    Last attempt to quit: 01/23/2010    Years since quitting: 7.2  . Smokeless tobacco: Never Used  . Tobacco comment: Quit smoking years ago then resumed smoking in 2009  Substance Use Topics  . Alcohol use: No  . Drug use: No    Review of Systems  Constitutional: No fever/chills Eyes: No visual changes. ENT: No sore throat. Cardiovascular: Denies chest pain. Respiratory: Denies shortness of breath. Gastrointestinal: Positive epigastric abdominal pain. No nausea, no vomiting. No diarrhea. No constipation. Genitourinary: Negative for  dysuria. Musculoskeletal: Negative for back pain. Skin: Negative for rash. Neurological: Negative for headaches, focal weakness or numbness.  10-point ROS otherwise negative.  ____________________________________________   PHYSICAL EXAM:  VITAL SIGNS: ED Triage Vitals  Enc Vitals Group     BP 04/28/17 2026 (!) 135/117     Pulse Rate 04/28/17 2026 64     Resp 04/28/17 2026 18     Temp 04/28/17 2026 98.4 F (36.9 C)     Temp Source 04/28/17 2026 Oral     SpO2 04/28/17 2026 99 %     Weight 04/28/17 2026 157 lb (71.2 kg)     Height 04/28/17 2026 5\' 7"  (1.702 m)     Pain Score 04/28/17 2025 9   Constitutional: Alert and oriented. Well appearing and in no acute distress. Eyes: Conjunctivae are normal.  Head: Atraumatic. Nose: No congestion/rhinnorhea. Mouth/Throat: Mucous membranes are moist.  Neck: No stridor.  Cardiovascular: Normal rate, regular rhythm. Good peripheral circulation. Grossly normal heart sounds.   Respiratory: Normal respiratory effort.  No retractions. Lungs CTAB. Gastrointestinal: Soft and nontender. No distention.  Musculoskeletal: No lower extremity tenderness nor edema. No gross deformities of extremities. Neurologic:  Normal speech and language. No gross focal neurologic deficits are appreciated.  Skin:  Skin is warm, dry and intact. No rash noted.  ____________________________________________   LABS (all labs ordered are listed, but only abnormal results are displayed)  Labs Reviewed  COMPREHENSIVE METABOLIC PANEL - Abnormal; Notable for the following components:      Result Value   Chloride 100 (*)    ALT 13 (*)    All other components within normal limits  URINALYSIS, ROUTINE W REFLEX MICROSCOPIC - Abnormal; Notable for the following components:   Leukocytes, UA MODERATE (*)    Squamous Epithelial / LPF 0-5 (*)    All other components within normal limits  LIPASE, BLOOD  CBC  I-STAT TROPONIN, ED    ____________________________________________  EKG  HR: 54 PR: 157 QTc: 411  Sinus rhythm. Normal axis. Narrow QRS. No ST elevation or depression.   ____________________________________________  RADIOLOGY  Dg Abdomen Acute W/chest  Result Date: 04/28/2017 CLINICAL DATA:  Epigastric abdominal pain. EXAM: DG ABDOMEN ACUTE W/ 1V CHEST COMPARISON:  CT 04/09/2017. FINDINGS: Post median sternotomy, lower most sternal wire is broken, unchanged. The cardiomediastinal contours are normal. The lungs are clear. There is no free intra-abdominal air. No dilated bowel loops to suggest obstruction. Small volume of stool throughout the colon. Cholecystectomy clips in the right upper quadrant. No radiopaque calculi. No acute osseous abnormalities are seen. Mild thoracolumbar scoliosis. Postsurgical change in the lower lumbar spine. IMPRESSION: Negative abdominal radiographs.  No acute cardiopulmonary disease. Electronically Signed   By: Rubye OaksMelanie  Ehinger M.D.   On: 04/28/2017 23:11    ____________________________________________   PROCEDURES  Procedure(s) performed:   Procedures  None ____________________________________________   INITIAL IMPRESSION / ASSESSMENT AND PLAN / ED COURSE  Pertinent labs & imaging results  that were available during my care of the patient were reviewed by me and considered in my medical decision making (see chart for details).  Patient presents to the ED with chronic epigastric abdominal pain for the last 2-3 years. No abdominal tenderness to palpation. Doubt atypical ACS but will obtain imaging of the chest/abdomen, EKG, and labs including troponin.   Labs and imaging reviewed with no acute abnormalities. EKG with no acute ischemic findings. Patient symptoms improved with GI cocktail. Suspect GI source. Plan for PCP follow up. Discharged home with Protonix and Carafate.   At this time, I do not feel there is any life-threatening condition present. I have reviewed  and discussed all results (EKG, imaging, lab, urine as appropriate), exam findings with patient. I have reviewed nursing notes and appropriate previous records.  I feel the patient is safe to be discharged home without further emergent workup. Discussed usual and customary return precautions. Patient and family (if present) verbalize understanding and are comfortable with this plan.  Patient will follow-up with their primary care provider. If they do not have a primary care provider, information for follow-up has been provided to them. All questions have been answered.  ____________________________________________  FINAL CLINICAL IMPRESSION(S) / ED DIAGNOSES  Final diagnoses:  Epigastric pain     MEDICATIONS GIVEN DURING THIS VISIT:  Medications  gi cocktail (Maalox,Lidocaine,Donnatal) (30 mLs Oral Given 04/28/17 2248)     NEW OUTPATIENT MEDICATIONS STARTED DURING THIS VISIT:  Discharge Medication List as of 04/29/2017 12:06 AM    START taking these medications   Details  pantoprazole (PROTONIX) 20 MG tablet Take 1 tablet (20 mg total) by mouth daily., Starting Thu 04/29/2017, Until Sat 05/29/2017, Print    sucralfate (CARAFATE) 1 GM/10ML suspension Take 10 mLs (1 g total) by mouth 4 (four) times daily -  with meals and at bedtime., Starting Thu 04/29/2017, Print        Note:  This document was prepared using Dragon voice recognition software and may include unintentional dictation errors.  Alona Bene, MD Emergency Medicine    Wafaa Deemer, Arlyss Repress, MD 04/29/17 [phone]

## 2017-04-28 NOTE — ED Triage Notes (Signed)
Pt c/o epigastric pain. Pt states she has been having this ongoing.

## 2017-04-29 LAB — URINALYSIS, ROUTINE W REFLEX MICROSCOPIC
BILIRUBIN URINE: NEGATIVE
Bacteria, UA: NONE SEEN
GLUCOSE, UA: NEGATIVE mg/dL
Hgb urine dipstick: NEGATIVE
Ketones, ur: NEGATIVE mg/dL
Nitrite: NEGATIVE
Protein, ur: NEGATIVE mg/dL
SPECIFIC GRAVITY, URINE: 1.02 (ref 1.005–1.030)
pH: 5 (ref 5.0–8.0)

## 2017-04-29 LAB — I-STAT TROPONIN, ED: TROPONIN I, POC: 0.01 ng/mL (ref 0.00–0.08)

## 2017-04-29 MED ORDER — SUCRALFATE 1 GM/10ML PO SUSP: 1.0000 g | Freq: Three times a day (TID) | ORAL | 0 refills | Status: DC

## 2017-04-29 MED ORDER — PANTOPRAZOLE SODIUM 20 MG PO TBEC: 20.0000 mg | DELAYED_RELEASE_TABLET | Freq: Every day | ORAL | 0 refills | Status: DC

## 2017-04-29 NOTE — ED Notes (Signed)
Patient self-dressed and ambulated out of room, redirected to room, recapped discharge papers and prescriptions, wheeled patient to waiting room, to await ride home.

## 2017-04-29 NOTE — ED Notes (Signed)
Ambulated patient to bathroom, patient is still saying she doesn't have a ride home.

## 2017-04-29 NOTE — ED Notes (Signed)
Went to discharge patient, she stated she didn't have a ride now or in the morning, tried calling daughter, voicemail box is full and message cannot be left.

## 2017-04-29 NOTE — Discharge Instructions (Signed)
You have been seen in the Emergency Department (ED) for abdominal pain.  Your evaluation did not identify a clear cause of your symptoms but was generally reassuring. ° °Please follow up as instructed above regarding today’s emergent visit and the symptoms that are bothering you. ° °Return to the ED if your abdominal pain worsens or fails to improve, you develop bloody vomiting, bloody diarrhea, you are unable to tolerate fluids due to vomiting, fever greater than 101, or other symptoms that concern you. ° ° °Procedures ° ° °

## 2017-05-09 ENCOUNTER — Other Ambulatory Visit: Payer: Self-pay | Admitting: Family Medicine

## 2017-06-27 ENCOUNTER — Other Ambulatory Visit: Payer: Self-pay | Admitting: Cardiology

## 2017-06-28 NOTE — Telephone Encounter (Signed)
REFILL 

## 2018-04-13 ENCOUNTER — Other Ambulatory Visit: Payer: Self-pay | Admitting: Cardiology

## 2018-04-13 NOTE — Telephone Encounter (Signed)
°*  STAT* If patient is at the pharmacy, call can be transferred to refill team.   1. Which medications need to be refilled? (please list name of each medication and dose if known) Furoseimed  2. Which pharmacy/location (including street and city if local pharmacy) is medication to be sent to? [facility] [place]  [place]  3. Do they need a 30 day or 90 day supply? 30

## 2018-04-13 NOTE — Telephone Encounter (Signed)
She has not come back for follow up.  We cannot refill.

## 2018-04-15 NOTE — Telephone Encounter (Signed)
Spoke with pt about Dr Antoine Poche responds to her medication, appt schedule for 04/01

## 2018-05-04 ENCOUNTER — Other Ambulatory Visit: Payer: Self-pay

## 2018-05-04 ENCOUNTER — Encounter (HOSPITAL_COMMUNITY): Payer: Self-pay | Admitting: Emergency Medicine

## 2018-05-04 DIAGNOSIS — Y929 Unspecified place or not applicable: Secondary | ICD-10-CM | POA: Diagnosis not present

## 2018-05-04 DIAGNOSIS — E039 Hypothyroidism, unspecified: Secondary | ICD-10-CM | POA: Diagnosis not present

## 2018-05-04 DIAGNOSIS — Y999 Unspecified external cause status: Secondary | ICD-10-CM | POA: Diagnosis not present

## 2018-05-04 DIAGNOSIS — Z87891 Personal history of nicotine dependence: Secondary | ICD-10-CM | POA: Insufficient documentation

## 2018-05-04 DIAGNOSIS — Y9301 Activity, walking, marching and hiking: Secondary | ICD-10-CM | POA: Insufficient documentation

## 2018-05-04 DIAGNOSIS — E119 Type 2 diabetes mellitus without complications: Secondary | ICD-10-CM | POA: Insufficient documentation

## 2018-05-04 DIAGNOSIS — S0990XA Unspecified injury of head, initial encounter: Secondary | ICD-10-CM | POA: Diagnosis not present

## 2018-05-04 DIAGNOSIS — W2209XA Striking against other stationary object, initial encounter: Secondary | ICD-10-CM | POA: Insufficient documentation

## 2018-05-04 DIAGNOSIS — I2510 Atherosclerotic heart disease of native coronary artery without angina pectoris: Secondary | ICD-10-CM | POA: Diagnosis not present

## 2018-05-04 NOTE — ED Triage Notes (Addendum)
Patient brought in by EMS with complaint of fall tonight. Patient has laceration noted to back of head. Denies pain to head. Complains of pain to right knee but states that is chronic and not from fall. Denies LOC.

## 2018-05-05 ENCOUNTER — Emergency Department (HOSPITAL_COMMUNITY): Payer: Medicare Other

## 2018-05-05 ENCOUNTER — Emergency Department (HOSPITAL_COMMUNITY)
Admission: EM | Admit: 2018-05-05 | Discharge: 2018-05-05 | Disposition: A | Discharge status: Home / Self Care | Payer: Medicare Other | Attending: Emergency Medicine | Admitting: Emergency Medicine

## 2018-05-05 DIAGNOSIS — R42 Dizziness and giddiness: Secondary | ICD-10-CM

## 2018-05-05 DIAGNOSIS — S0101XA Laceration without foreign body of scalp, initial encounter: Secondary | ICD-10-CM

## 2018-05-05 LAB — BASIC METABOLIC PANEL
Anion gap: 8 (ref 5–15)
BUN: 19 mg/dL (ref 8–23)
CALCIUM: 9.2 mg/dL (ref 8.9–10.3)
CO2: 24 mmol/L (ref 22–32)
CREATININE: 0.56 mg/dL (ref 0.44–1.00)
Chloride: 104 mmol/L (ref 98–111)
Glucose, Bld: 97 mg/dL (ref 70–99)
Potassium: 4.1 mmol/L (ref 3.5–5.1)
Sodium: 136 mmol/L (ref 135–145)

## 2018-05-05 LAB — CBC WITH DIFFERENTIAL/PLATELET
Abs Immature Granulocytes: 0.04 10*3/uL (ref 0.00–0.07)
BASOS ABS: 0.1 10*3/uL (ref 0.0–0.1)
BASOS PCT: 1 %
Eosinophils Absolute: 0.1 10*3/uL (ref 0.0–0.5)
Eosinophils Relative: 1 %
HCT: 35.7 % — ABNORMAL LOW (ref 36.0–46.0)
Hemoglobin: 10.7 g/dL — ABNORMAL LOW (ref 12.0–15.0)
Immature Granulocytes: 0 %
Lymphocytes Relative: 26 %
Lymphs Abs: 2.7 10*3/uL (ref 0.7–4.0)
MCH: 25.1 pg — AB (ref 26.0–34.0)
MCHC: 30 g/dL (ref 30.0–36.0)
MCV: 83.8 fL (ref 80.0–100.0)
MONO ABS: 0.6 10*3/uL (ref 0.1–1.0)
Monocytes Relative: 5 %
NRBC: 0 % (ref 0.0–0.2)
Neutro Abs: 7 10*3/uL (ref 1.7–7.7)
Neutrophils Relative %: 67 %
PLATELETS: 429 10*3/uL — AB (ref 150–400)
RBC: 4.26 MIL/uL (ref 3.87–5.11)
RDW: 15.5 % (ref 11.5–15.5)
WBC: 10.4 10*3/uL (ref 4.0–10.5)

## 2018-05-05 NOTE — ED Notes (Signed)
EKG performed, pt refused urine, pt discharged with written and verbal discharge paperwork during system downtime

## 2018-05-05 NOTE — ED Provider Notes (Signed)
Emergency Department Provider Note   I have reviewed the triage vital signs and the nursing notes.   HISTORY  Chief Complaint Fall   HPI Amber Bean is a 78 y.o. female who presents the emergency department today secondary to hitting her head.  Patient states she was walking at the doorway stumbled a little bit and hit her right posterior side of her head on a latching mechanism on her door frame.  She did not pass out she did not fall no neurologic symptoms but she had bleeding so the neighbors called EMS EMS told her to come here for evaluation you know the patient did not think she needed it.  On further review of systems patient's had episodic itchiness.  She is had periods where she will stumble when she walks but not quite fall.  Has been going on for a couple months.  They do not know if it is related to low blood sugar or her history of vertigo but patient states is not the vertigo. No other associated or modifying symptoms.    Past Medical History:  Diagnosis Date  . Anemia, iron deficiency   . Anemia, vitamin B12 deficiency   . BACK PAIN, CHRONIC   . CAD (coronary artery disease)    S/P CABG in 1995. Last cath in 2003 demonstrated left main 20% stenosis, LAD 50-70% mid stenosis, circumflex 40-50% stenosis in the AV groove, 30% right coronary artery stenosis. Saphenous vein graft to the marginal was patent. A LIMA to the LAD was patent.  Marland Kitchen CAROTID ARTERY STENOSIS   . Diabetes mellitus    Diet controlled since weight loss  . GERD (gastroesophageal reflux disease)   . Hypothyroidism   . Mitral valve disorders(424.0)   . Obstructive sleep apnea on CPAP    intol of CPAP - noncompliant  . Osteopenia   . Pancreatitis   . 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 Tobacco abuse    Quit 02/2010    Patient Active Problem List   Diagnosis Date Noted  . Hyperlipidemia 08/06/2010  . DYSPHAGIA UNSPECIFIED 04/03/2010  . ANEMIA-IRON DEFICIENCY  10/07/2009  . GERD 10/07/2009  . KIDNEY DISEASE 10/07/2009  . Chronic back pain 10/07/2009  . TOBACCO ABUSE 02/27/2009  . MITRAL VALVE DISORDERS 02/27/2009  . CAROTID ARTERY STENOSIS 02/27/2009  . HYPOTHYROIDISM 12/31/2008  . T2DM (type 2 diabetes mellitus) (HCC) 12/31/2008  . CAD (coronary artery disease) 12/31/2008  . PANCREATITIS 12/31/2008  . SLEEP APNEA 12/31/2008    Past Surgical History:  Procedure Laterality Date  . ABDOMINAL HYSTERECTOMY    . APPENDECTOMY    . BREAST LUMPECTOMY    . CARDIAC CATHETERIZATION  2003  . CORONARY ARTERY BYPASS GRAFT  1995   [place], [place]  . LUMBAR FUSION    . SAPHENOUS VEIN GRAFT RESECTION  2003  . TONSILLECTOMY      Current Outpatient Rx  . Order #: [id number] Class: Historical Med  . Order #: [id number] Class: Historical Med  . Order #: [id number] Class: Normal  . Order #: [id number] Class: Normal  . Order #: [id number] Class: Normal  . Order #: [id number] Class: Historical Med  . Order #: [id number] Class: Normal  . Order #: [id number] Class: Normal  . Order #: [id number] Class: Historical Med  . Order #: [id number] Class: Normal  . Order #: [id number] Class: Print  . Order #: [id number] Class: Normal  . Order #: [id number] Class: Normal  . Order #: [id number] Class: Print    Allergies Contrast media [iodinated diagnostic agents]; Morphine; Nitrofurantoin;  Ciprofloxacin; and Trazodone and nefazodone  Family History  Problem Relation Age of Onset  . Hypertension Father   . Heart disease Mother   . Stroke Mother   . Heart disease Brother   . Stroke Maternal Grandfather   . Hypertension Other   . Hyperlipidemia Other   . Stroke Other     Social History Social History   Tobacco Use  . Smoking status: Former Smoker    Packs/day: 1.00    Types: Cigarettes    Last attempt to quit: 01/23/2010    Years since quitting: 8.2  . Smokeless tobacco: Never Used  . Tobacco comment: Quit smoking years ago then resumed smoking in 2009  Substance Use Topics   . Alcohol use: No  . Drug use: No    Review of Systems  All other systems negative except as documented in the HPI. All pertinent positives and negatives as reviewed in the HPI. ____________________________________________   PHYSICAL EXAM:  VITAL SIGNS: ED Triage Vitals  Enc Vitals Group     BP 05/04/18 2217 128/87     Pulse Rate 05/04/18 2217 74     Resp 05/04/18 2217 20     Temp 05/04/18 2217 98.5 F (36.9 C)     Temp Source 05/04/18 2217 Oral     SpO2 05/04/18 2217 100 %     Weight 05/04/18 2219 160 lb (72.6 kg)     Height 05/04/18 2219 5\' 7"  (1.702 m)    Constitutional: Alert and oriented. Well appearing and in no acute distress. Eyes: Conjunctivae are normal. PERRL. EOMI. Head: Atraumatic. Nose: No congestion/rhinnorhea. Mouth/Throat: Mucous membranes are moist.  Oropharynx non-erythematous. Neck: No stridor.  No meningeal signs.   Cardiovascular: Normal rate, regular rhythm. Good peripheral circulation. Grossly normal heart sounds.   Respiratory: Normal respiratory effort.  No retractions. Lungs CTAB. Gastrointestinal: Soft and nontender. No distention.  Musculoskeletal: No cervical spine tenderness, thoracic spine tenderness or Lumbar spine tenderness.  No tenderness or pain with palpation and full ROM of all joints in upper and lower extremities.  No ecchymosis or other signs of trauma on back or extremities.  No Pain with AP or lateral compression of ribs.  No Paracervical ttp, paraspinal ttp Neurologic:  No altered mental status, able to give full seemingly accurate history.  Face is symmetric, EOM's intact, pupils equal and reactive, vision intact, tongue and uvula midline without deviation. Upper and Lower extremity motor 5/5, intact pain perception in distal extremities, 2+ reflexes in biceps, patella and achilles tendons. Able to perform finger to nose normal with both hands. Walks without assistance or evident ataxia but did stumble once (self corrected  without much problem).  Skin:  Skin is warm, dry. 2 cm curvilinear well approximated hemostatic wound to posterior parietal area. No rash noted.   ____________________________________________   LABS (all labs ordered are listed, but only abnormal results are displayed)  Labs Reviewed  CBC WITH DIFFERENTIAL/PLATELET  BASIC METABOLIC PANEL  URINALYSIS, ROUTINE W REFLEX MICROSCOPIC   ____________________________________________  EKG   EKG Interpretation  Date/Time:    Ventricular Rate:    PR Interval:    QRS Duration:   QT Interval:    QTC Calculation:   R Axis:     Text Interpretation:         ____________________________________________  RADIOLOGY  No results found.  ____________________________________________   PROCEDURES  Procedure(s) performed:   Procedures   ____________________________________________   INITIAL IMPRESSION / ASSESSMENT AND PLAN / ED COURSE  We  will evaluate for any intracranial injury secondary to laceration but also to evaluate for these episodes of dizziness and fall onto the one side.  Will recheck urine since she is retreated for urinary tract infection recently.  We will also check kidney function and other labs to ensure no other abnormalities of might be causing his dizziness if this is okay patient can be discharged to follow-up with her PCP.  At this point I think the wound really needs to be repaired as it is already hemostatic and well approximated, hemostatic and a tender scalp.  General wound care explained.    Pertinent labs & imaging results that were available during my care of the patient were reviewed by me and considered in my medical decision making (see chart for details).  ____________________________________________  FINAL CLINICAL IMPRESSION(S) / ED DIAGNOSES  Final diagnoses:  None     MEDICATIONS GIVEN DURING THIS VISIT:  Medications - No data to display   NEW OUTPATIENT MEDICATIONS STARTED DURING  THIS VISIT:  New Prescriptions   No medications on file    Note:  This note was prepared with assistance of Dragon voice recognition software. Occasional wrong-word or sound-a-like substitutions may have occurred due to the inherent limitations of voice recognition software.   Corinn Stoltzfus, Barbara Cower, MD 05/06/18 [phone]

## 2018-05-25 ENCOUNTER — Encounter

## 2018-05-25 ENCOUNTER — Ambulatory Visit: Payer: Medicare Other | Admitting: Cardiology

## 2018-06-04 ENCOUNTER — Other Ambulatory Visit: Payer: Self-pay

## 2018-06-04 ENCOUNTER — Emergency Department (HOSPITAL_COMMUNITY)
Admission: EM | Admit: 2018-06-04 | Discharge: 2018-06-06 | Disposition: A | Discharge status: Other Acute Inpatient Hospital | Payer: Medicare Other | Attending: Emergency Medicine | Admitting: Emergency Medicine

## 2018-06-04 ENCOUNTER — Emergency Department (HOSPITAL_COMMUNITY): Payer: Medicare Other

## 2018-06-04 ENCOUNTER — Encounter (HOSPITAL_COMMUNITY): Payer: Self-pay

## 2018-06-04 DIAGNOSIS — Z79899 Other long term (current) drug therapy: Secondary | ICD-10-CM | POA: Diagnosis not present

## 2018-06-04 DIAGNOSIS — Z87891 Personal history of nicotine dependence: Secondary | ICD-10-CM | POA: Insufficient documentation

## 2018-06-04 DIAGNOSIS — R4689 Other symptoms and signs involving appearance and behavior: Secondary | ICD-10-CM | POA: Insufficient documentation

## 2018-06-04 DIAGNOSIS — F329 Major depressive disorder, single episode, unspecified: Secondary | ICD-10-CM | POA: Diagnosis not present

## 2018-06-04 DIAGNOSIS — R51 Headache: Secondary | ICD-10-CM | POA: Diagnosis not present

## 2018-06-04 DIAGNOSIS — E119 Type 2 diabetes mellitus without complications: Secondary | ICD-10-CM | POA: Insufficient documentation

## 2018-06-04 DIAGNOSIS — Z951 Presence of aortocoronary bypass graft: Secondary | ICD-10-CM | POA: Insufficient documentation

## 2018-06-04 DIAGNOSIS — R05 Cough: Secondary | ICD-10-CM | POA: Diagnosis not present

## 2018-06-04 DIAGNOSIS — Z7982 Long term (current) use of aspirin: Secondary | ICD-10-CM | POA: Diagnosis not present

## 2018-06-04 DIAGNOSIS — I2510 Atherosclerotic heart disease of native coronary artery without angina pectoris: Secondary | ICD-10-CM | POA: Diagnosis not present

## 2018-06-04 DIAGNOSIS — M7918 Myalgia, other site: Secondary | ICD-10-CM | POA: Insufficient documentation

## 2018-06-04 DIAGNOSIS — R441 Visual hallucinations: Secondary | ICD-10-CM | POA: Insufficient documentation

## 2018-06-04 DIAGNOSIS — R1084 Generalized abdominal pain: Secondary | ICD-10-CM | POA: Insufficient documentation

## 2018-06-04 DIAGNOSIS — R0602 Shortness of breath: Secondary | ICD-10-CM | POA: Insufficient documentation

## 2018-06-04 LAB — COMPREHENSIVE METABOLIC PANEL
ALT: 9 U/L (ref 0–44)
AST: 18 U/L (ref 15–41)
Albumin: 3.4 g/dL — ABNORMAL LOW (ref 3.5–5.0)
Alkaline Phosphatase: 89 U/L (ref 38–126)
Anion gap: 10 (ref 5–15)
BUN: 10 mg/dL (ref 8–23)
CO2: 24 mmol/L (ref 22–32)
Calcium: 9.5 mg/dL (ref 8.9–10.3)
Chloride: 105 mmol/L (ref 98–111)
Creatinine, Ser: 0.68 mg/dL (ref 0.44–1.00)
GFR calc Af Amer: >60 mL/min (ref >60)
GFR calc non Af Amer: >60 mL/min (ref >60)
Glucose, Bld: 126 mg/dL — ABNORMAL HIGH (ref 70–99)
Potassium: 3.6 mmol/L (ref 3.5–5.1)
Sodium: 139 mmol/L (ref 135–145)
Total Bilirubin: 0.3 mg/dL (ref 0.3–1.2)
Total Protein: 6.6 g/dL (ref 6.5–8.1)

## 2018-06-04 LAB — CBC WITH DIFFERENTIAL/PLATELET
Abs Immature Granulocytes: 0.02 10*3/uL (ref 0.00–0.07)
Basophils Absolute: 0.1 10*3/uL (ref 0.0–0.1)
Basophils Relative: 1 %
Eosinophils Absolute: 0.1 10*3/uL (ref 0.0–0.5)
Eosinophils Relative: 1 %
HCT: 41.9 % (ref 36.0–46.0)
Hemoglobin: 12.2 g/dL (ref 12.0–15.0)
Immature Granulocytes: 0 %
Lymphocytes Relative: 22 %
Lymphs Abs: 1.7 10*3/uL (ref 0.7–4.0)
MCH: 24.3 pg — ABNORMAL LOW (ref 26.0–34.0)
MCHC: 29.1 g/dL — ABNORMAL LOW (ref 30.0–36.0)
MCV: 83.5 fL (ref 80.0–100.0)
Monocytes Absolute: 0.5 10*3/uL (ref 0.1–1.0)
Monocytes Relative: 6 %
Neutro Abs: 5.6 10*3/uL (ref 1.7–7.7)
Neutrophils Relative %: 70 %
Platelets: 428 10*3/uL — ABNORMAL HIGH (ref 150–400)
RBC: 5.02 MIL/uL (ref 3.87–5.11)
RDW: 16.6 % — ABNORMAL HIGH (ref 11.5–15.5)
WBC: 7.9 10*3/uL (ref 4.0–10.5)
nRBC: 0 % (ref 0.0–0.2)

## 2018-06-04 LAB — ETHANOL: Alcohol, Ethyl (B): <10 mg/dL (ref <10)

## 2018-06-04 LAB — LIPASE, BLOOD: Lipase: 41 U/L (ref 11–51)

## 2018-06-04 MED ORDER — SODIUM CHLORIDE 0.9 % IV BOLUS
500.0000 mL | Freq: Once | INTRAVENOUS | Status: AC
  Administered 2018-06-04: 500 mL via INTRAVENOUS

## 2018-06-04 MED ORDER — ALPRAZOLAM 0.25 MG PO TABS
1.0000 mg | ORAL_TABLET | Freq: Once | ORAL | Status: AC
  Administered 2018-06-05: 1 mg via ORAL
  Filled 2018-06-04: qty 4

## 2018-06-04 NOTE — ED Notes (Signed)
Pt was found in the bathroom trying to remove her IV. After arguing about there not being any bathrooms for her to use, she returned to the room and allowed this EMT to re tape her IV. She stated that per TTS, she "is allowed to do whatever she wants." She refused to get back in the bed. Patient sat in the chair in the room and told this EMT to "get your ass out of here". Patient left to sit comfortably in chair. Will continue to monitor.

## 2018-06-04 NOTE — ED Notes (Addendum)
Daughters: [name] [phone] Audrea Muscat [phone] Kirk Ruths [phone]   Call family with questions on history and updates.

## 2018-06-04 NOTE — ED Triage Notes (Signed)
Pt brought in by [facility] for abdominal pain, sore throat, cough, SOB, congestion, and HA. Pt endorses chronic abdominal pain. Pt uncooperative, refusing to stay in bed, states she wants to leave. Pt is disoriented to time, when asked pt states "I have too many things to do to keep up with something like that".

## 2018-06-04 NOTE — BH Assessment (Addendum)
Tele Assessment Note   Patient Name: Amber Bean MRN: [id number] Referring Physician: Geoffery Lyons, MD Location of Patient: [facility], [phone] Location of Provider: Behavioral Health TTS Department  Amber Bean is an 78 y.o. widowed female who presents unaccompanied to [facility] ED reporting abdominal pain and diarrhea. Pt's daughter told ED staff that Pt's primary problem is progressive confusion, agitation and responding to things that are not there. Pt was initially defensive but eventually answered some questions. She identifies her primary problem as abdominal pain and diarrhea. Pt appeared confused at times during the assessment and said "this room isn't really an emergency room." She also had difficultly remembering how many children she has and their names. Pt denies auditory or visual hallucinations but then says there is a strange noise in her house that has been bothering her and that "it followed me here. I heard it out there" point to the hallway. Pt states she has not been sleeping well. She says there are days she doesn't want to eat and that she has lost a significant amount of weight. She denies current suicidal ideation or history of suicide attempts. She denies thoughts of harming others or history of aggression.    Pt says she lives alone with her dog in a mobile home. She says she has daughters that she doesn't always get along with. She denies any history of mental health problems.   TTS contacted Pt's daughter, Audrea Muscat [phone], via telephone. She says Pt has a history of mental health problems and received ECT over forty years ago. She says her mother has always been "bossy and independent" but over the past several months she has been more agitated, argumentative, confused and forgetful. Ms Sandre Kitty says Pt has been completely uncooperative with receiving treatment and she suspects Pt isn't taking her prescribed medications. She says family contacted adult  protective services but they didn't intervene. She says Pt has been alone in her residence talking to people who are not there. She says Pt's husband died in 07-23-14 and a few days ago Pt insisted he wasn't dead. Ms Sandre Kitty says she and her siblings are very concerned for their mother's wellbeing and believe she needs to be hospitalized.  Pt is sitting in a chair with a hospital mask. She is alert and oriented and oriented to person and situation but at times appears to not know exactly where she is. Pt speaks in an aggressive tone, at moderate volume and normal pace. Motor behavior appears normal. Eye contact is good. Pt's mood is irritable and affect is angry and defensive at times, cooperative at times. Thought process is coherent and relevant. Pt's memory appears impaired. Her insight and judgment are impaired. Pt states she wants to go home.  Diagnosis: F33.3 Major depressive disorder, Recurrent episode, With psychotic features  Past Medical History:  Past Medical History:  Diagnosis Date  . Anemia, iron deficiency   . Anemia, vitamin B12 deficiency   . BACK PAIN, CHRONIC   . CAD (coronary artery disease)    S/P CABG in 07-22-93. Last cath in 07-22-01 demonstrated left main 20% stenosis, LAD 50-70% mid stenosis, circumflex 40-50% stenosis in the AV groove, 30% right coronary artery stenosis. Saphenous vein graft to the marginal was patent. A LIMA to the LAD was patent.  Marland Kitchen CAROTID ARTERY STENOSIS   . Diabetes mellitus    Diet controlled since weight loss  . GERD (gastroesophageal reflux disease)   . Hypothyroidism   .  Mitral valve disorders(424.0)   . Obstructive sleep apnea on CPAP    intol of CPAP - noncompliant  . Osteopenia   . Pancreatitis   . 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 Tobacco abuse    Quit 02/2010    Past Surgical History:  Procedure Laterality Date  . ABDOMINAL HYSTERECTOMY    . APPENDECTOMY    . BREAST LUMPECTOMY    . CARDIAC CATHETERIZATION   2003  . CORONARY ARTERY BYPASS GRAFT  1995   [place], [place]  . LUMBAR FUSION    . SAPHENOUS VEIN GRAFT RESECTION  2003  . TONSILLECTOMY      Family History:  Family History  Problem Relation Age of Onset  . Hypertension Father   . Heart disease Mother   . Stroke Mother   . Heart disease Brother   . Stroke Maternal Grandfather   . Hypertension Other   . Hyperlipidemia Other   . Stroke Other     Social History:  reports that she quit smoking about 8 years ago. Her smoking use included cigarettes. She smoked 1.00 pack per day. She has never used smokeless tobacco. She reports that she does not drink alcohol or use drugs.  Additional Social History:  Alcohol / Drug Use Pain Medications: See MAR Prescriptions: See MAR Over the Counter: See MAR History of alcohol / drug use?: No history of alcohol / drug abuse Longest period of sobriety (when/how long): NA  CIWA: CIWA-Ar BP: (!) 138/126 Pulse Rate: 60 COWS:    Allergies:  Allergies  Allergen Reactions  . Morphine And Related Other (See Comments) and Hives    "makes me mean as a snake"   . Nitrofurantoin Other (See Comments)    Ruptured tendin  Ruptured tendin   . Trazodone And Nefazodone Rash  . Contrast Media [Iodinated Diagnostic Agents] Other (See Comments)    AFIB  . Morphine     agression  . Ciprocin-Fluocin-Procin  [Fluocinolone] Rash  . Ciprofloxacin Rash    Home Medications: (Not in a hospital admission)   OB/GYN Status:  No LMP recorded. Patient has had a hysterectomy.  General Assessment Data Location of Assessment: [facility] ED TTS Assessment: In system Is this a Tele or Face-to-Face Assessment?: Tele Assessment Is this an Initial Assessment or a Re-assessment for this encounter?: Initial Assessment Patient Accompanied by:: N/A(Alone) Language Other than English: No Living Arrangements: Other (Comment)(Lives alone) What gender do you identify as?: Female Marital status: Widowed [place] name:  Unknown Pregnancy Status: No Living Arrangements: Alone Can pt return to current living arrangement?: Yes Admission Status: Voluntary Is patient capable of signing voluntary admission?: Yes Referral Source: Self/Family/Friend Insurance type: Mudlogger     Crisis Care Plan Living Arrangements: Alone Legal Guardian: Other:(Self) Name of Psychiatrist: None Name of Therapist: None  Education Status Is patient currently in school?: No Is the patient employed, unemployed or receiving disability?: Unemployed  Risk to self with the past 6 months Suicidal Ideation: No Has patient been a risk to self within the past 6 months prior to admission? : No Suicidal Intent: No Has patient had any suicidal intent within the past 6 months prior to admission? : No Is patient at risk for suicide?: No Suicidal Plan?: No Has patient had any suicidal plan within the past 6 months prior to admission? : No Access to Means: No What has been your use of drugs/alcohol within the last 12 months?: Pt denies Previous Attempts/Gestures: No How many  times?: 0 Other Self Harm Risks: Pt reported to be experiencing hallucinations Triggers for Past Attempts: None known Intentional Self Injurious Behavior: None Family Suicide History: No Recent stressful life event(s): Recent negative physical changes Persecutory voices/beliefs?: Yes Depression: Yes Depression Symptoms: Feeling angry/irritable, Fatigue, Isolating, Tearfulness, Insomnia, Despondent Substance abuse history and/or treatment for substance abuse?: No Suicide prevention information given to non-admitted patients: Not applicable  Risk to Others within the past 6 months Homicidal Ideation: No Does patient have any lifetime risk of violence toward others beyond the six months prior to admission? : No Thoughts of Harm to Others: No Current Homicidal Intent: No Current Homicidal Plan: No Access to Homicidal Means: No Identified Victim:  None History of harm to others?: No Assessment of Violence: None Noted Violent Behavior Description: None Does patient have access to weapons?: No Criminal Charges Pending?: No Does patient have a court date: No Is patient on probation?: No  Psychosis Hallucinations: Auditory(Hears a noise) Delusions: Unspecified  Mental Status Report Appearance/Hygiene: Unremarkable Eye Contact: Good Motor Activity: Unremarkable Speech: Logical/coherent Level of Consciousness: Alert Mood: Irritable Affect: Irritable Anxiety Level: Moderate Thought Processes: Coherent, Relevant Judgement: Impaired Orientation: Person, Place, Time, Situation Obsessive Compulsive Thoughts/Behaviors: None  Cognitive Functioning Concentration: Decreased Memory: Remote Impaired, Recent Impaired Is patient IDD: No Impulse Control: Fair Appetite: Poor Have you had any weight changes? : Loss Amount of the weight change? (lbs): 10 lbs Sleep: Decreased Total Hours of Sleep: 4 Vegetative Symptoms: None  ADLScreening [facility] Assessment Services) Patient's cognitive ability adequate to safely complete daily activities?: Yes Patient able to express need for assistance with ADLs?: Yes Independently performs ADLs?: Yes (appropriate for developmental age)  Prior Inpatient Therapy Prior Inpatient Therapy: No  Prior Outpatient Therapy Prior Outpatient Therapy: Yes Prior Therapy Dates: Years ago Prior Therapy Facilty/Provider(s): Unknown Reason for Treatment: Depression Does patient have an ACCT team?: No Does patient have Intensive In-House Services?  : No Does patient have [facility] services? : No Does patient have P4CC services?: No  ADL Screening (condition at time of admission) Patient's cognitive ability adequate to safely complete daily activities?: Yes Is the patient deaf or have difficulty hearing?: No Does the patient have difficulty seeing, even when wearing glasses/contacts?: No Does the patient have  difficulty concentrating, remembering, or making decisions?: Yes Patient able to express need for assistance with ADLs?: Yes Does the patient have difficulty dressing or bathing?: No Independently performs ADLs?: Yes (appropriate for developmental age) Does the patient have difficulty walking or climbing stairs?: No Weakness of Legs: None Weakness of Arms/Hands: None  Home Assistive Devices/Equipment Home Assistive Devices/Equipment: None    Abuse/Neglect Assessment (Assessment to be complete while patient is alone) Abuse/Neglect Assessment Can Be Completed: Yes Physical Abuse: Denies Verbal Abuse: Denies Sexual Abuse: Denies Exploitation of patient/patient's resources: Denies Self-Neglect: Denies     Merchant navy officer (For Healthcare) Does Patient Have a Medical Advance Directive?: No Would patient like information on creating a medical advance directive?: No - Patient declined          Disposition: Gave clinical report to Nira Conn, FNP who recommended inpatient treatment on a geriatric-psychiatry unit. TTS will contact appropriate facilities for placement. Notified Dr. Jaci Carrel and RN of recommendation. Notified Pt's daughter, Audrea Muscat, of recommendation.  Disposition Patient referred to: Other (Comment)(Geriatric-psychiatry)  This service was provided via telemedicine using a 2-way, interactive audio and video technology.  Names of all persons participating in this telemedicine service and their role in this encounter. Name: Janalyn Shy  Role: Patient  Name: Audrea MuscatWanda Raines (via telephone) Role: Pt's daughter  Name: Daphane ShepherdFord Wasrrick Jr, [facility] Role: TTS counselor      Harlin RainFord Ellis Patsy BaltimoreWarrick Jr, [facility], [facility], [facility] Triage Specialist [phone]  Pamalee LeydenWarrick Jr, Vaneta Hammontree Ellis 06/04/2018 11:44 PM

## 2018-06-04 NOTE — ED Provider Notes (Signed)
[facility] EMERGENCY DEPARTMENT Provider Note   CSN: [id number] Arrival date & time: 06/04/18  2019-07-04    History   Chief Complaint Chief Complaint  Patient presents with  . Abdominal Pain  . Cough  . Headache    HPI Amber Bean is a 78 y.o. female.     Patient is a 78 year old female with past medical history of diabetes, coronary artery disease with CABG in Jul 03, 1993, chronic pain, anemia.  She presents today with multiple physical and behavioral complaints.  According to the patient she has been having generalized abdominal pain, loose stools, decreased appetite, and decreased p.o. intake for the past 5 days.  She has told her daughter she has had fever.  Patient also describes generalized body aches.  I have spoken with the patient's daughter named [name].  The daughter and he tells me somewhat of a different story.  She has described diarrhea for the past week, but it sounds as though the main reason for her visit to the ER tonight is related to behavioral issues.  According to [name], this patient's husband died in Jul 03, 2013 and she has had a progressive decline in her mental health.  Over the past several months this has progressed more rapidly.  The daughter tells me she has been verbally abusive toward her and is having hallucinations.  The daughter tells me her mother reports seeing her dead husband at her home.  She is also having delusions of people stealing from her and that her behavioral patterns have been increasingly erratic.  The daughter believes that she requires psychiatric help.  As far as the daughter knows, there is no alcohol or illicit drug [place]  The history is provided by the patient and a relative (Daughter [name]).  Abdominal Pain  Pain location:  Generalized Pain quality: cramping   Pain radiates to:  Does not radiate Pain severity:  Moderate Duration:  5 days Timing:  Constant Progression:  Worsening Chronicity:  New Relieved by:  Nothing  Worsened by:  Nothing Associated symptoms: cough   Cough  Associated symptoms: headaches   Headache  Associated symptoms: abdominal pain and cough     Past Medical History:  Diagnosis Date  . Anemia, iron deficiency   . Anemia, vitamin B12 deficiency   . BACK PAIN, CHRONIC   . CAD (coronary artery disease)    S/P CABG in 07/03/93. Last cath in 2001/07/03 demonstrated left main 20% stenosis, LAD 50-70% mid stenosis, circumflex 40-50% stenosis in the AV groove, 30% right coronary artery stenosis. Saphenous vein graft to the marginal was patent. A LIMA to the LAD was patent.  Marland Kitchen CAROTID ARTERY STENOSIS   . Diabetes mellitus    Diet controlled since weight loss  . GERD (gastroesophageal reflux disease)   . Hypothyroidism   . Mitral valve disorders(424.0)   . Obstructive sleep apnea on CPAP    intol of CPAP - noncompliant  . Osteopenia   . Pancreatitis   . 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 Tobacco abuse    Quit 02/2010    Patient Active Problem List   Diagnosis Date Noted  . Hyperlipidemia 08/06/2010  . DYSPHAGIA UNSPECIFIED 04/03/2010  . ANEMIA-IRON DEFICIENCY 10/07/2009  . GERD 10/07/2009  . KIDNEY DISEASE 10/07/2009  . Chronic back pain 10/07/2009  . TOBACCO ABUSE 02/27/2009  . MITRAL VALVE DISORDERS 02/27/2009  . CAROTID ARTERY STENOSIS 02/27/2009  . HYPOTHYROIDISM 12/31/2008  . T2DM (type 2 diabetes mellitus) (HCC)  12/31/2008  . CAD (coronary artery disease) 12/31/2008  . PANCREATITIS 12/31/2008  . SLEEP APNEA 12/31/2008    Past Surgical History:  Procedure Laterality Date  . ABDOMINAL HYSTERECTOMY    . APPENDECTOMY    . BREAST LUMPECTOMY    . CARDIAC CATHETERIZATION  2003  . [place]ONARY ARTERY [place]RAFT  [place]  . LUMBAR FUSION    . SAPHENOUS VEIN GRAFT RESECTION  2003  . TONSILLECTOMY       OB History   No obstetric history on file.      Home Medications    Prior to Admission medications   Medication Sig Start  Date End Date Taking? Authorizing Provider  ALPRAZolam Prudy Feeler(XANAX) 0.5 MG tablet Take 1 tablet by mouth 2 (two) times daily as needed for anxiety. 04/09/17   [provider]  aspirin 325 MG tablet Take 325 mg by mouth daily as needed for mild pain.     [provider]  atenolol (TENORMIN) 50 MG tablet TAKE 1.5 TABLETS (75 MG TOTAL) BY MOUTH DAILY. KEEP OV. 06/28/17   Rollene RotundaHochrein, James, MD  baclofen (LIORESAL) 10 MG tablet TAKE 1 TABLET BY MOUTH THREE TIMES DAILY. 06/09/16   Elenora GammaBradshaw, Samuel L, MD  bumetanide (BUMEX) 1 MG tablet TAKE (1) TABLET BY MOUTH TWICE A DAY IF NEEDED. 05/06/16   Elenora GammaBradshaw, Samuel L, MD  dicyclomine (BENTYL) 10 MG capsule Take 10 mg by mouth daily as needed for spasms.  01/12/13   [provider]  levothyroxine (SYNTHROID, LEVOTHROID) 112 MCG tablet TAKE (1) TABLET BY MOUTH DAILY 11/12/16   Elenora GammaBradshaw, Samuel L, MD  lisinopril (PRINIVIL,ZESTRIL) 10 MG tablet Take 1 tablet (10 mg total) by mouth daily. 11/12/16   Elenora GammaBradshaw, Samuel L, MD  meclizine (ANTIVERT) 25 MG tablet Take 25 mg by mouth daily as needed for dizziness or nausea.     [provider]  nitroGLYCERIN (NITROSTAT) 0.4 MG SL tablet Place 1 tablet (0.4 mg total) under the tongue as needed. Patient taking differently: Place 0.4 mg under the tongue as needed for chest pain.  09/02/15   Elenora GammaBradshaw, Samuel L, MD  pantoprazole (PROTONIX) 20 MG tablet Take 1 tablet (20 mg total) by mouth daily. 04/29/17 05/29/17  Long, Arlyss RepressJoshua G, MD  potassium chloride SA (K-DUR,KLOR-CON) 20 MEQ tablet TAKE ONE TABLET BY MOUTH TWICE DAILY Patient taking differently: TAKE ONE TABLET BY MOUTH DAILY 09/10/15   Elenora GammaBradshaw, Samuel L, MD  rOPINIRole (REQUIP) 1 MG tablet TAKE ONE (1) TABLET AT BEDTIME Patient taking differently: TAKE ONE TABLET BY MOUTH EVERY NIGHT EVERY OTHER DAY AT BEDTIME/ OR AS NEEDED FOR RESTLESS LEGS 09/10/15   Elenora GammaBradshaw, Samuel L, MD  sucralfate (CARAFATE) 1 GM/10ML suspension Take 10 mLs (1 g total) by mouth 4  (four) times daily -  with meals and at bedtime. 04/29/17   Long, Arlyss RepressJoshua G, MD    Family History Family History  Problem Relation Age of Onset  . Hypertension Father   . Heart disease Mother   . Stroke Mother   . Heart disease Brother   . Stroke Maternal Grandfather   . Hypertension Other   . Hyperlipidemia Other   . Stroke Other     Social History Social History   Tobacco Use  . Smoking status: Former Smoker    Packs/day: 1.00    Types: Cigarettes    Last attempt to quit: 01/23/2010    Years since quitting: 8.3  . Smokeless tobacco: Never Used  . Tobacco comment: Quit  smoking years ago then resumed smoking in 2009  Substance Use Topics  . Alcohol use: No  . Drug use: No     Allergies   Morphine and related; Nitrofurantoin; Trazodone and nefazodone; Contrast media [iodinated diagnostic agents]; Morphine; Ciprocin-fluocin-procin  [fluocinolone]; and Ciprofloxacin   Review of Systems Review of Systems  Respiratory: Positive for cough.   Gastrointestinal: Positive for abdominal pain.  Neurological: Positive for headaches.  All other systems reviewed and are negative.    Physical Exam Updated Vital Signs Ht 5\' 7"  (1.702 m)   Wt 72.6 kg   BMI 25.06 kg/m   Physical Exam Vitals signs and nursing note reviewed.  Constitutional:      General: She is not in acute distress.    Appearance: She is well-developed. She is not diaphoretic.  HENT:     Head: Normocephalic and atraumatic.  Neck:     Musculoskeletal: Normal range of motion and neck supple.  Cardiovascular:     Rate and Rhythm: Normal rate and regular rhythm.     Heart sounds: No murmur. No friction rub. No gallop.   Pulmonary:     Effort: Pulmonary effort is normal. No respiratory distress.     Breath sounds: Normal breath sounds. No wheezing.  Abdominal:     General: Bowel sounds are normal. There is no distension.     Palpations: Abdomen is soft.     Tenderness: There is generalized abdominal  tenderness. There is no guarding or rebound.  Musculoskeletal: Normal range of motion.  Skin:    General: Skin is warm and dry.  Neurological:     Mental Status: She is alert and oriented to person, place, and time.      ED Treatments / Results  Labs (all labs ordered are listed, but only abnormal results are displayed) Labs Reviewed  COMPREHENSIVE METABOLIC PANEL  LIPASE, BLOOD  CBC WITH DIFFERENTIAL/PLATELET  URINALYSIS, ROUTINE W REFLEX MICROSCOPIC  RAPID URINE DRUG SCREEN, HOSP PERFORMED  ETHANOL    EKG EKG Interpretation  Date/Time:  Saturday June 04 2018 21:10:50 EDT Ventricular Rate:  60 PR Interval:    QRS Duration: 94 QT Interval:  428 QTC Calculation: 428 R Axis:   9 Text Interpretation:  Sinus rhythm Anteroseptal infarct, old Confirmed by Geoffery Lyons ([id number]) on 06/04/2018 9:24:53 PM   Radiology No results found.  Procedures Procedures (including critical care time)  Medications Ordered in ED Medications  sodium chloride 0.9 % bolus 500 mL (has no administration in time range)     Initial Impression / Assessment and Plan / ED Course  I have reviewed the triage vital signs and the nursing notes.  Pertinent labs & imaging results that were available during my care of the patient were reviewed by me and considered in my medical decision making (see chart for details).  Patient brought here at the request of family for evaluation of increasingly erratic and abusive behavior as described in the HPI.  She is also having hallucinations and seeing her dead husband who passed away 5 years ago.  Patient's medical work-up at this point is essentially unremarkable.  Patient will require consultation with behavioral health.  TTS has been consulted and will evaluate.  Care signed out to Dr. Blinda Leatherwood at shift change awaiting TTS input.  Final Clinical Impressions(s) / ED Diagnoses   Final diagnoses:  None    ED Discharge Orders    None       Geoffery Lyons, MD 06/05/18 [id number]

## 2018-06-05 ENCOUNTER — Emergency Department (HOSPITAL_COMMUNITY): Payer: Medicare Other

## 2018-06-05 LAB — RAPID URINE DRUG SCREEN, HOSP PERFORMED
Amphetamines: NOT DETECTED
Barbiturates: NOT DETECTED
Benzodiazepines: POSITIVE — AB
Cocaine: NOT DETECTED
Opiates: NOT DETECTED
Tetrahydrocannabinol: NOT DETECTED

## 2018-06-05 LAB — URINALYSIS, ROUTINE W REFLEX MICROSCOPIC
Bilirubin Urine: NEGATIVE
Glucose, UA: NEGATIVE mg/dL
Hgb urine dipstick: NEGATIVE
Ketones, ur: NEGATIVE mg/dL
Nitrite: POSITIVE — AB
Protein, ur: NEGATIVE mg/dL
Specific Gravity, Urine: 1.009 (ref 1.005–1.030)
pH: 6 (ref 5.0–8.0)

## 2018-06-05 MED ORDER — ROPINIROLE HCL 1 MG PO TABS
1.0000 mg | ORAL_TABLET | Freq: Every day | ORAL | Status: DC
  Administered 2018-06-05: 1 mg via ORAL
  Filled 2018-06-05 (×2): qty 1

## 2018-06-05 MED ORDER — AMITRIPTYLINE HCL 25 MG PO TABS
75.0000 mg | ORAL_TABLET | Freq: Every evening | ORAL | Status: DC
  Administered 2018-06-05: 75 mg via ORAL
  Filled 2018-06-05: qty 3

## 2018-06-05 MED ORDER — LISINOPRIL 10 MG PO TABS
10.0000 mg | ORAL_TABLET | Freq: Every day | ORAL | Status: DC
  Administered 2018-06-05 – 2018-06-06 (×2): 10 mg via ORAL
  Filled 2018-06-05 (×2): qty 1

## 2018-06-05 MED ORDER — POTASSIUM CHLORIDE CRYS ER 20 MEQ PO TBCR
20.0000 meq | EXTENDED_RELEASE_TABLET | Freq: Two times a day (BID) | ORAL | Status: DC
  Administered 2018-06-05 – 2018-06-06 (×3): 20 meq via ORAL
  Filled 2018-06-05 (×3): qty 1

## 2018-06-05 MED ORDER — BACLOFEN 5 MG HALF TABLET
10.0000 mg | ORAL_TABLET | Freq: Three times a day (TID) | ORAL | Status: DC | PRN
  Administered 2018-06-05: 10 mg via ORAL
  Filled 2018-06-05: qty 2

## 2018-06-05 MED ORDER — ATENOLOL 25 MG PO TABS
75.0000 mg | ORAL_TABLET | Freq: Every day | ORAL | Status: DC
  Administered 2018-06-06: 75 mg via ORAL
  Filled 2018-06-05 (×2): qty 3

## 2018-06-05 MED ORDER — HALOPERIDOL LACTATE 5 MG/ML IJ SOLN
2.0000 mg | Freq: Once | INTRAMUSCULAR | Status: AC
  Administered 2018-06-05: 2 mg via INTRAVENOUS
  Filled 2018-06-05: qty 1

## 2018-06-05 MED ORDER — ASPIRIN 325 MG PO TABS: 325.0000 mg | ORAL_TABLET | Freq: Every day | ORAL | Status: DC | PRN

## 2018-06-05 MED ORDER — HALOPERIDOL LACTATE 5 MG/ML IJ SOLN: 5.0000 mg | Freq: Once | INTRAMUSCULAR | Status: DC

## 2018-06-05 MED ORDER — OXYCODONE HCL 5 MG PO TABS
10.0000 mg | ORAL_TABLET | Freq: Three times a day (TID) | ORAL | Status: DC
  Administered 2018-06-05 – 2018-06-06 (×6): 10 mg via ORAL
  Filled 2018-06-05 (×6): qty 2

## 2018-06-05 MED ORDER — DICYCLOMINE HCL 10 MG PO CAPS: 10.0000 mg | ORAL_CAPSULE | Freq: Every day | ORAL | Status: DC | PRN

## 2018-06-05 MED ORDER — LEVOTHYROXINE SODIUM 112 MCG PO TABS
112.0000 ug | ORAL_TABLET | Freq: Every day | ORAL | Status: DC
  Administered 2018-06-05 – 2018-06-06 (×2): 112 ug via ORAL
  Filled 2018-06-05 (×2): qty 1

## 2018-06-05 MED ORDER — ALPRAZOLAM 0.5 MG PO TABS
0.5000 mg | ORAL_TABLET | Freq: Two times a day (BID) | ORAL | Status: DC | PRN
  Administered 2018-06-05 (×2): 0.5 mg via ORAL
  Filled 2018-06-05 (×2): qty 1

## 2018-06-05 MED ORDER — CEPHALEXIN 250 MG PO CAPS
250.0000 mg | ORAL_CAPSULE | Freq: Four times a day (QID) | ORAL | Status: DC
  Administered 2018-06-05 – 2018-06-06 (×5): 250 mg via ORAL
  Filled 2018-06-05 (×5): qty 1

## 2018-06-05 MED ORDER — BUMETANIDE 1 MG PO TABS: 1.0000 mg | ORAL_TABLET | Freq: Every day | ORAL | Status: DC | PRN

## 2018-06-05 MED ORDER — SERTRALINE HCL 25 MG PO TABS
25.0000 mg | ORAL_TABLET | Freq: Every day | ORAL | Status: DC
  Administered 2018-06-05 – 2018-06-06 (×2): 25 mg via ORAL
  Filled 2018-06-05 (×3): qty 1

## 2018-06-05 NOTE — BH Assessment (Signed)
[facility] Assessment Progress Note   Patient was seen for reassessment today.  Patient states that she is feeling better today.  When asked what brought her to the hospital, patient stated, "I was so sick that I could hardly stand it."  Patient was oriented x 2, but appears to be disoriented to him and her behavioral health situation.  When asked how long her husband had been deceased, she said "a long time."  When asked how long she had been married, patient stated, "a long time."  When asked about her history of depression or her recently seeing things in the house and being somewhat paranoid at home, patient would not answer these questions.  TTS will continue to see geriatric psychiatric placement for patient based on her current presentation.

## 2018-06-05 NOTE — ED Notes (Signed)
Pt's daughter, [name], called asking to speak w/pt. Advised her pt is sleeping at this time. Daughter advised she was on her way to pt's home to check to see if the door is locked to her mobile home so she can check on her dog. States she will come to ED after to pick up pt's belongings as she needs the key to be able to gain entry if door is locked. Daughter aware pt has been diagnosed w/UTI and is being treated w/Keflex. Pt asked what other meds her mother was being given - advised her home meds are being administered. Daughter asked when the psychiatrist will see pt. Advised her this will take place at mental health facility. Voiced understanding.

## 2018-06-05 NOTE — ED Notes (Signed)
Pt states she is experiencing difficulty resting after she had ambulated to bathroom w/assistance. Xanax given. Urine specimen obtained.

## 2018-06-05 NOTE — ED Notes (Signed)
Pt refusing to get up and change into burgandy scrubs. Pt states she is going home and we will not stop her. Pt still not getting out of bed but continually yelling.

## 2018-06-05 NOTE — ED Notes (Signed)
Pt attempted to obtain urine specimen x 2 this am - missed cup. Will re-attempt. Pt has spoken w/her daughters this am.

## 2018-06-05 NOTE — ED Notes (Signed)
Per XR tech, patient refusing XR at this time. Wont turn over on her back.

## 2018-06-05 NOTE — ED Notes (Signed)
Microbiologist aware of need for [employer].

## 2018-06-05 NOTE — ED Provider Notes (Signed)
Behavioral health recommends inpatient treatment.  IVC has been filled.   Virgina Norfolk, DO 06/05/18 [phone]

## 2018-06-05 NOTE — ED Notes (Signed)
Sitter has arrived at bedside.

## 2018-06-05 NOTE — Progress Notes (Signed)
CSW contacted referral facilities with the following results:  Still reviewing: [facility]  Declined: [facility] ([facility] is closed) [facility] (no bed currently) [facility] (at capacity) [facility] (no outside admissions due to COVID-19) [facility] (no beds currently)  TTS will continue to seek placement.   Vilma Meckel. Algis Greenhouse, MSW, LCSW Clinical Social Work/Disposition Phone: [phone] Fax: [phone]

## 2018-06-05 NOTE — BH Assessment (Signed)
Information faxed to the following facilities for placement:  [facility]    [facility]   [facility]   [facility]   [facility]   [facility]   [facility]    [address] P[place]y [place], [facility], [facility], [facility] Triage Specialist [phone]

## 2018-06-05 NOTE — ED Notes (Signed)
Patient still refusing to change out of scrubs or remove her jewelry. Patient calm but not cooperative of staff's requests.

## 2018-06-05 NOTE — Care Management (Signed)
ED CM reviewed record, Patient awaiting possible placement for Geri-Psych.

## 2018-06-05 NOTE — Progress Notes (Signed)
CSW faxed over requested supplemental information requested from [facility].   TTS will continue to seek placement.   Vilma Meckel. Algis Greenhouse, MSW, LCSW Clinical Social Work/Disposition Phone: [phone] Fax: [phone]

## 2018-06-05 NOTE — ED Notes (Addendum)
IVC papers served - copy faxed to [facility], copy sent to Medical Records, original placed in folder for Magistrate, and all 3 sets on clipboard.  

## 2018-06-05 NOTE — ED Notes (Signed)
Pt eating dinner. Aware her daughter, [name], called and requested for her to call her back after she finishes eating. Pt gave permission for staff to give [name] all of her belongings. [name] had advised RN she is on her way to ED to get pt's belongings including mobile home keys so she can check on her dog. Advised she will call when arrives to outside of ED.

## 2018-06-05 NOTE — Progress Notes (Signed)
CSW received call from daughter Lisbeth Ply- [phone]) who had questions about the referral process. CSW provided requested information. Siedl complained regarding thoughts that she did not feel that her mother needed to return to her home. Siedl stated that her mother has been verbally abusive and declining mental health issues. She was updated that nothing had been not found currently but we will continue to seek placement. She asked if she could be updated if her mother was moved. She stated that she needed to to pick up her mother's things from the ED. SW stated that he could transfer her there to ask them about picking up items. She stated that she had already spoken with them and that she would just like to be updated if her mother is transferred to another facility before tomorrow. CSW agreed to contact her if anything changed this evening.   Daughter to be contact if patient is placed this evening. Lisbeth Ply [phone]) stated that she would like to be contacted regarding any updates in her mother's care and she will update the rest of her family.   TTS will continue to seek placement.  Vilma Meckel. Algis Greenhouse, MSW, LCSW Clinical Social Work/Disposition Phone: [phone] Fax: [phone]

## 2018-06-05 NOTE — ED Notes (Signed)
ALL belongings - 2 labeled belongings bags and 1 valuables envelope - given to daughter, [name], per pt's authorization.

## 2018-06-05 NOTE — ED Notes (Signed)
Dinner tray ordered.

## 2018-06-05 NOTE — ED Notes (Signed)
Verified Magistrate received IVC papers completed by Dr Lockie Mola.

## 2018-06-05 NOTE — ED Notes (Signed)
Amber Bean (Daughter) [phone]

## 2018-06-05 NOTE — ED Notes (Signed)
Pt on phone w/her daughter, [name].

## 2018-06-05 NOTE — ED Notes (Signed)
Per [name], dtr, pt has not been exposed to anyone w/COVID. Pt does not present w/cough or fever.

## 2018-06-05 NOTE — ED Notes (Signed)
Daughter, [name], called and advised she is the family member for [place] to communicate with and she will communicate w/her siblings and pt's sister. States "my sister Burna Mortimer is an alcoholic and drug addict". States she [name]) is pt's caregiver. Also states she will call later so she can come pick up pt's keys to her mobile home so she can check on pt's dog.

## 2018-06-05 NOTE — ED Notes (Signed)
Patient changed into scrubs and belongings inventoried. Unable to remove rings from patient's fingers. Patient has one gold tone band, one silver tone band and one silver tone ring with blue stone remaining on her hand

## 2018-06-05 NOTE — ED Notes (Signed)
Patient ambulated from the bathroom with minimal assistance per the patient's request. States "I can do it by myself. I don't need your damn help".   Patient refuses the hook up to the monitor or get back in the bed.

## 2018-06-05 NOTE — ED Provider Notes (Addendum)
Daily rounding. Please see previous provider for full H&P.   In short,  Amber Bean is a 78 y.o. female with a PMH of diabetes, CAD with CABG in 1995, chronic pain, and anemia presenting for abdominal pain, cough, confusion, and agitation. Previous provider spoke to daughter, Amber Bean, and daughter reports a progressive decline in patient's mental health after husband's death in 71. Daughter reports hallucinations and delusions.   Physical Exam  BP 107/79   Pulse (!) 58   Temp 99.2 F (37.3 C) (Oral)   Resp 14   Ht 5\' 7"  (1.702 m)   Wt 72.6 kg   SpO2 100%   BMI 25.06 kg/m   Physical Exam Vitals signs and nursing note reviewed.  Constitutional:      General: She is not in acute distress.    Appearance: She is well-developed.     Comments: Patient is resting comfortably in bed in no acute distress.  HENT:     Head: Normocephalic and atraumatic.  Pulmonary:     Effort: Pulmonary effort is normal.  Neurological:     Mental Status: She is alert.     ED Course/Procedures     Procedures  MDM  Patient presents with abdominal pain, cough, and confusion. Vitals, labs, and imaging reviewed. CT abdomen reveals no acute abnormality seen in the abdomen or pelvis. No acute chest findings on CXR. EKG without acute changes. WBCs are within normal limits. Patient is stable and currently comfortable in no distress. Patient was IVC'd by Dr. Lockie Mola. [facility] recommends inpatient treatment on a geriatric-psychiatry unit. Pending placement.    Leretha Dykes, PA-C 06/05/18 0830    Virgina Norfolk, DO 06/05/18 [phone]

## 2018-06-06 LAB — CBG MONITORING, ED: Glucose-Capillary: 92 mg/dL (ref 70–99)

## 2018-06-06 NOTE — ED Notes (Signed)
Provider at bedside

## 2018-06-06 NOTE — Progress Notes (Addendum)
Accepted to [facility] for inpatient gero-psych services.   Room Assigned: 401-B Accepting physician is Dr. Lowanda Foster, MD.  Patient can arrive at anytime, bed is ready.   "Nurse to Nurse" number for report is [phone].    Maudry Mayhew, RN notified.  Per Aldean Jewett, she will notify the patient's attending physician.     Baldo Daub, MSW, LCSWA Clinical Social Worker [facility]  Phone: [phone]

## 2018-06-06 NOTE — ED Notes (Signed)
[facility] here to transport pt to [facility]. Dr. Lockie Mola notified for reassessment.

## 2018-06-06 NOTE — ED Notes (Signed)
Attempted to call report but Nurse not available.

## 2018-06-06 NOTE — ED Notes (Signed)
Pt stats pain is "horrible on waking". Request pain medication.

## 2018-06-06 NOTE — ED Provider Notes (Signed)
Psychiatric Default Provider Note:   Patient is a 78 year old female with a past medical history as below presenting initially for abdominal pain, however ultimately psychiatrically assessed at the request of her daughter.  Patient apparently at home had been having some erratic behavior and had hallucinations of her late husband.  Past Medical History:  Diagnosis Date  . Anemia, iron deficiency   . Anemia, vitamin B12 deficiency   . BACK PAIN, CHRONIC   . CAD (coronary artery disease)    S/P CABG in 1995. Last cath in 2003 demonstrated left main 20% stenosis, LAD 50-70% mid stenosis, circumflex 40-50% stenosis in the AV groove, 30% right coronary artery stenosis. Saphenous vein graft to the marginal was patent. A LIMA to the LAD was patent.  Marland Kitchen CAROTID ARTERY STENOSIS   . Diabetes mellitus    Diet controlled since weight loss  . GERD (gastroesophageal reflux disease)   . Hypothyroidism   . Mitral valve disorders(424.0)   . Obstructive sleep apnea on CPAP    intol of CPAP - noncompliant  . Osteopenia   . Pancreatitis   . 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 Tobacco abuse    Quit 02/2010   On evaluation this morning, patient is eating, sitting comfortably on side of the bed.  She does report that she is experiencing diffuse chronic pain.  She is alert, oriented to place, and is able to provide information about her family living in the area.  Vital signs reviewed over the past 24 hours and are stable as below.  Vitals:   06/06/18 0843 06/06/18 1020  BP: 139/62 (!) 145/70  Pulse: 63   Resp: 18 20  Temp: 98 F (36.7 C)   SpO2: 96% 100%   A & P  Patient currently awaiting Geri psychiatric placement.  No acute complaints today. Will continue to monitor.    Elisha Ponder, PA-C 06/06/18 1025    Derwood Kaplan, MD 06/07/18 [phone]

## 2018-06-06 NOTE — ED Notes (Signed)
Pt eating breakfast and tolerating well.

## 2018-06-06 NOTE — ED Notes (Signed)
Pt sitting at side of bed eating breakfast.

## 2018-06-06 NOTE — ED Provider Notes (Addendum)
Patient admitted to [facility].  Accepted by Dr. Yetta Barre.  Stable throughout my care.  Patient evaluated prior to departure, she is alert well-appearing been friendly with staff.  However, she is upset about having to leave and go to another hospital.   Virgina Norfolk, DO 06/06/18 1445    Patsy Zaragoza, DO 06/06/18 1634

## 2018-06-06 NOTE — ED Notes (Signed)
Pt's CBG result was 92. Informed Millie - RN.

## 2018-06-06 NOTE — ED Notes (Signed)
Pt oob to bathroom with walker. Pt returns from bathroom without walker. Pt assisted to room and placed in bed.

## 2018-06-06 NOTE — ED Notes (Signed)
Pt d/c with [facility] department, pt wearing two rings that remained with pt during admission.

## 2018-06-06 NOTE — ED Notes (Addendum)
Carb Modified Lunch Tray Ordered

## 2018-06-06 NOTE — ED Notes (Signed)
Pt's lunch tray arrived. 

## 2018-06-06 NOTE — ED Notes (Signed)
Pt becoming increasingly agitated. Refusing discharge vitals. Charge RN aware.

## 2018-06-07 LAB — URINE CULTURE: Culture: >=100000 — AB

## 2018-06-08 ENCOUNTER — Telehealth: Payer: Self-pay | Admitting: Emergency Medicine

## 2018-06-08 NOTE — Telephone Encounter (Signed)
Post ED Visit - Positive Culture Follow-up  Culture report reviewed by antimicrobial stewardship pharmacist: [facility] []  Enzo Bi, Pharm.D. []  Celedonio Miyamoto, Pharm.D., BCPS AQ-ID []  Garvin Fila, Pharm.D., BCPS []  Georgina Pillion, Pharm.D., BCPS []  [employer], [address].D., BCPS, AAHIVP []  Estella Husk, Pharm.D., BCPS, AAHIVP [x]  Lysle Pearl, PharmD, BCPS []  Phillips Climes, PharmD, BCPS []  Agapito Games, PharmD, BCPS []  Verlan Friends, PharmD []  Mervyn Gay, PharmD, BCPS []  Vinnie Level, PharmD  [facility] []  Len Childs, PharmD []  Greer Pickerel, PharmD []  Adalberto Cole, PharmD []  Perlie Gold, Rph []  Lonell Face) Jean Rosenthal, PharmD []  Earl Many, PharmD []  Junita Push, PharmD []  Dorna Leitz, PharmD []  Terrilee Files, PharmD []  Lynann Beaver, PharmD []  Keturah Barre, PharmD []  Loralee Pacas, PharmD []  Bernadene Person, PharmD   Positive urine culture Treated with keflex, results of urine culture faxed to [facility]  @ [phone] attn Morrie Sheldon .  Berle Mull 06/08/2018, 2:27 PM

## 2018-06-27 ENCOUNTER — Telehealth: Payer: Self-pay | Admitting: Cardiology

## 2018-06-27 NOTE — Telephone Encounter (Signed)
Pt gave verbal permission to speak with daughter re medical care Edema has been noted recently and pt did have some Progressive soup the other day not sure of Sodium content. Per daughter swelling has gone down some Daughter was hard to follow as would talk about self as well as aunts that were former pt's of Dr Hochrein's Reviewed pt's chart does not appear pt has ever been on Lasix .Offered an earlier virtual visit with APP pt's daughter declined and will keep 5/27 appt with Dr Antoine Poche. Will continue to monitor and if swelling worsens will call back ./cy

## 2018-06-27 NOTE — Telephone Encounter (Signed)
  Pt c/o swelling: STAT is pt has developed SOB within 24 hours  1) How much weight have you gained and in what time span? Don't know  2) If swelling, where is the swelling located? Ankles and feet  3) Are you currently taking a fluid pill? no  4) Are you currently SOB? no  5) Do you have a log of your daily weights (if so, list)? no  6) Have you gained 3 pounds in a day or 5 pounds in a week? no  7) Have you traveled recently? no  Daughter is calling because Ms Helman has some swelling in her ankles and feet. She wonders if she should be on lasix for the swelling.

## 2018-06-30 NOTE — Telephone Encounter (Signed)
Attempted to contact pt regarding her upcoming appt as Dr Antoine Poche will not be going to [facility] 5/27 but is still seeing pts virtually that day.  Can also offer an earlier virtual appt for further evaluation if needed.

## 2018-07-12 NOTE — Telephone Encounter (Signed)
Was able to contact patient and schedule her for tomorrow with Dr Antoine Poche in [place].

## 2018-07-12 NOTE — Progress Notes (Signed)
Virtual Visit via Video Note   This visit type was conducted due to national recommendations for restrictions regarding the COVID-19 Pandemic (e.g. social distancing) in an effort to limit this patient's exposure and mitigate transmission in our community.  Due to her co-morbid illnesses, this patient is at least at moderate risk for complications without adequate follow up.  This format is felt to be most appropriate for this patient at this time.  All issues noted in this document were discussed and addressed.  A limited physical exam was performed with this format.  Please refer to the patient's chart for her consent to telehealth for [facility].   Date:  07/13/2018   ID:  Amber Bean, DOB 03/11/1940, MRN [id number]  Patient Location: Home  Provider Location: Home  PCP:  Veverly Fells, MD (Inactive)  Cardiologist:  Rollene Rotunda Electrophysiologist:  None   Evaluation Performed:  Follow-Up Visit  Chief Complaint:  Leg swelling  History of Present Illness:    Amber Bean is a 78 y.o. female with with CAD.    She was in the ED last month brought in by her family because of increasingly erratic behavior.   She has been a no show for multiple appointments with me.     She is now living with her daughter.  She seems to be doing relatively well on the other end of this video conference.  She is in no distress.  She is not having any chest pressure, neck or arm discomfort.  She is not describing palpitations, presyncope or syncope.  She has had some lower extremity edema.  She try to keep her feet elevated.  She gets around in the house and does some light household chores.  The patient does not have symptoms concerning for COVID-19 infection (fever, chills, cough, or new shortness of breath).    Past Medical History:  Diagnosis Date   Anemia, iron deficiency    Anemia, vitamin B12 deficiency    BACK PAIN, CHRONIC    CAD (coronary artery disease)    S/P CABG in  1995. Last cath in 2003 demonstrated left main 20% stenosis, LAD 50-70% mid stenosis, circumflex 40-50% stenosis in the AV groove, 30% right coronary artery stenosis. Saphenous vein graft to the marginal was patent. A LIMA to the LAD was patent.   CAROTID ARTERY STENOSIS    Diabetes mellitus    Diet controlled since weight loss   GERD (gastroesophageal reflux disease)    Hypothyroidism    Mitral valve disorders(424.0)    Obstructive sleep apnea on CPAP    intol of CPAP - noncompliant   Osteopenia    Pancreatitis    Pseudoaneurysm (HCC)    Requiring repair following last cath   Tobacco abuse    Quit 02/2010   Past Surgical History:  Procedure Laterality Date   ABDOMINAL HYSTERECTOMY     APPENDECTOMY     BREAST LUMPECTOMY     CARDIAC CATHETERIZATION  2003   CORONARY ARTERY BYPASS GRAFT  1995   [place], [place]   LUMBAR FUSION     SAPHENOUS VEIN GRAFT RESECTION  2003   TONSILLECTOMY      Prior to Admission medications   Medication Sig Start Date End Date Taking? Authorizing Provider  amitriptyline (ELAVIL) 75 MG tablet Take 75 mg by mouth every evening. 04/08/18  Yes [provider]  aspirin 81 MG tablet Take 81 mg by mouth daily as needed for mild pain.    Yes [provider]  atenolol (TENORMIN) 50 MG tablet TAKE 1.5 TABLETS (75 MG TOTAL) BY MOUTH DAILY. KEEP OV. Patient taking differently: Take 25-50 mg by mouth 2 (two) times daily. 50 mg in the morning and 25 mg in the evening. 06/28/17  Yes Rollene Rotunda, MD  diltiazem (CARDIZEM CD) 120 MG 24 hr capsule Take 120 mg by mouth daily.   Yes [provider]  gabapentin (NEURONTIN) 100 MG capsule Take 100 mg by mouth 3 (three) times daily.   Yes [provider]  levothyroxine (SYNTHROID, LEVOTHROID) 112 MCG tablet TAKE (1) TABLET BY MOUTH DAILY Patient taking differently: Take 112 mcg by mouth daily before breakfast.  11/12/16  Yes Elenora Gamma, MD  lisinopril  (PRINIVIL,ZESTRIL) 10 MG tablet Take 1 tablet (10 mg total) by mouth daily. 11/12/16  Yes Elenora Gamma, MD  methocarbamol (ROBAXIN) 750 MG tablet Take 750 mg by mouth 3 (three) times daily as needed for pain. 04/29/18  Yes [provider]  nitroGLYCERIN (NITROSTAT) 0.4 MG SL tablet Place 1 tablet (0.4 mg total) under the tongue as needed. Patient taking differently: Place 0.4 mg under the tongue as needed for chest pain.  09/02/15  Yes Elenora Gamma, MD  Oxycodone HCl 10 MG TABS Take 10 mg by mouth 4 (four) times daily. 02/14/18  Yes [provider]  potassium chloride SA (K-DUR,KLOR-CON) 20 MEQ tablet TAKE ONE TABLET BY MOUTH TWICE DAILY Patient taking differently: Take 20 mEq by mouth 2 (two) times daily.  09/10/15  Yes Elenora Gamma, MD  risperiDONE (RISPERDAL) 1 MG tablet Take 1 mg by mouth 3 (three) times daily.   Yes [provider]  sertraline (ZOLOFT) 100 MG tablet Take 100 mg by mouth daily.  04/11/18  Yes [provider]  ALPRAZolam Prudy Feeler) 0.5 MG tablet Take 1 tablet by mouth 2 (two) times daily as needed for anxiety. 04/09/17   [provider]  baclofen (LIORESAL) 10 MG tablet TAKE 1 TABLET BY MOUTH THREE TIMES DAILY. Patient not taking: No sig reported 06/09/16   Elenora Gamma, MD  bumetanide (BUMEX) 1 MG tablet TAKE (1) TABLET BY MOUTH TWICE A DAY IF NEEDED. Patient not taking: No sig reported 05/06/16   Elenora Gamma, MD  dicyclomine (BENTYL) 10 MG capsule Take 10 mg by mouth daily as needed for spasms.  01/12/13   [provider]  meclizine (ANTIVERT) 25 MG tablet Take 25 mg by mouth daily as needed for dizziness or nausea.     [provider]  pantoprazole (PROTONIX) 20 MG tablet Take 1 tablet (20 mg total) by mouth daily. 04/29/17 06/04/18  Long, Arlyss Repress, MD  rOPINIRole (REQUIP) 1 MG tablet TAKE ONE (1) TABLET AT BEDTIME Patient not taking: Reported on 07/13/2018 09/10/15   Elenora Gamma, MD    sucralfate (CARAFATE) 1 GM/10ML suspension Take 10 mLs (1 g total) by mouth 4 (four) times daily -  with meals and at bedtime. Patient not taking: Reported on 06/04/2018 04/29/17   Maia Plan, MD    Allergies:   Morphine and related; Nitrofurantoin; Trazodone and nefazodone; Contrast media [iodinated diagnostic agents]; Morphine; Ciprocin-fluocin-procin  [fluocinolone]; and Ciprofloxacin   Social History   Tobacco Use   Smoking status: Former Smoker    Packs/day: 1.00    Types: Cigarettes    Last attempt to quit: 01/23/2010    Years since quitting: 8.4   Smokeless tobacco: Never Used   Tobacco comment: Quit smoking years ago then  resumed smoking in 2009  Substance Use Topics   Alcohol use: No   Drug use: No     Family Hx: The patient's family history includes Heart disease in her brother and mother; Hyperlipidemia in an other family member; Hypertension in her father and another family member; Stroke in her maternal grandfather, mother, and another family member.  ROS:   Please see the history of present illness.    As stated in the HPI and negative for all other systems.   Prior CV studies:   The following studies were reviewed today:    Labs/Other Tests and Data Reviewed:    EKG:  06/06/18  NSR, premature atrial contractions, nonspecific diffuse T wave flattening, poor anterior R wave progression.  Recent Labs: 06/04/2018: ALT 9; BUN 10; Creatinine, Ser 0.68; Hemoglobin 12.2; Platelets 428; Potassium 3.6; Sodium 139   Recent Lipid Panel Lab Results  Component Value Date/Time   CHOL 181 05/04/2007   TRIG 264 05/04/2007   HDL 40 05/04/2007   LDLCALC 88 05/04/2007    Wt Readings from Last 3 Encounters:  07/13/18 160 lb (72.6 kg)  06/04/18 160 lb (72.6 kg)  05/04/18 160 lb (72.6 kg)     Objective:    Vital Signs:  BP (!) 120/55 (BP Location: Left Arm)    Pulse 68    Ht 5\' 7"  (1.702 m)    Wt 160 lb (72.6 kg)    BMI 25.06 kg/m    VITAL SIGNS:   reviewed GEN:  no acute distress EYES:  sclerae anicteric, EOMI - Extraocular Movements Intact RESPIRATORY:  normal respiratory effort, symmetric expansion NEURO:  alert and oriented x 3, no obvious focal deficit PSYCH:  normal affect  ASSESSMENT & PLAN:    CAD -  The patient has no new sypmtoms.  No further cardiovascular testing is indicated.  We will continue with aggressive risk reduction and meds as listed.  TOBACCO ABUSE -  She is not smoking.   CAROTID ARTERY STENOSIS -  She has failed to comply with follow up imaging that has previously been scheduled.    MITRAL VALVE DISORDERS - She had mild to mod MR in 2017.   No follow up is indicated    Hyperlipidemia -  She won't take statins.  No change in therapy  Hypertension -  The blood pressure is at target.  No change in therapy.  Edema She was supposed to be on Bumex and I will renew this as needed.  I did review her labs and she is had normal BUN and creatinine in April.  Anemia I note that she had a new anemia in April.  Reports some bright red blood on her toilet paper sometimes.  I am and have her get a follow-up CBC.  COVID-19 Education: The signs and symptoms of COVID-19 were discussed with the patient and how to seek care for testing (follow up with PCP or arrange E-visit).  The importance of social distancing was discussed today.  Time:   Today, I have spent 26 minutes with the patient with telehealth technology discussing the above problems.     Medication Adjustments/Labs and Tests Ordered: Current medicines are reviewed at length with the patient today.  Concerns regarding medicines are outlined above.   Tests Ordered: No orders of the defined types were placed in this encounter.   Medication Changes: No orders of the defined types were placed in this encounter.   Disposition:  Follow up with me in one year.  Signed, Rollene Rotunda, MD  07/13/2018 1:38 PM    [facility]

## 2018-07-13 ENCOUNTER — Encounter: Payer: Self-pay | Admitting: Cardiology

## 2018-07-13 ENCOUNTER — Other Ambulatory Visit: Payer: Self-pay

## 2018-07-13 ENCOUNTER — Telehealth (INDEPENDENT_AMBULATORY_CARE_PROVIDER_SITE_OTHER): Payer: Medicare Other | Admitting: Cardiology

## 2018-07-13 VITALS — BP 120/55 | HR 68 | Ht 67.0 in | Wt 160.0 lb

## 2018-07-13 DIAGNOSIS — Z720 Tobacco use: Secondary | ICD-10-CM

## 2018-07-13 DIAGNOSIS — E785 Hyperlipidemia, unspecified: Secondary | ICD-10-CM

## 2018-07-13 DIAGNOSIS — R609 Edema, unspecified: Secondary | ICD-10-CM

## 2018-07-13 DIAGNOSIS — Z7189 Other specified counseling: Secondary | ICD-10-CM | POA: Insufficient documentation

## 2018-07-13 DIAGNOSIS — I2510 Atherosclerotic heart disease of native coronary artery without angina pectoris: Secondary | ICD-10-CM | POA: Insufficient documentation

## 2018-07-13 DIAGNOSIS — I10 Essential (primary) hypertension: Secondary | ICD-10-CM | POA: Insufficient documentation

## 2018-07-13 MED ORDER — BUMETANIDE 1 MG PO TABS: ORAL_TABLET | ORAL | 6 refills | Status: AC

## 2018-07-13 MED ORDER — NITROGLYCERIN 0.4 MG SL SUBL: 0.4000 mg | SUBLINGUAL_TABLET | SUBLINGUAL | 99 refills | Status: AC | PRN

## 2018-07-13 NOTE — Patient Instructions (Signed)
Medication Instructions:  The current medical regimen is effective;  continue present plan and medications.  If you need a refill on your cardiac medications before your next appointment, please call your pharmacy.   Follow-Up: Follow up in 1 year with Dr. Hochrein in [place].  You will receive a letter in the mail 2 months before you are due.  Please call us when you receive this letter to schedule your follow up appointment.  Thank you for choosing [facility]!!     

## 2018-07-20 ENCOUNTER — Ambulatory Visit: Payer: Medicare Other | Admitting: Cardiology

## 2019-07-18 NOTE — Progress Notes (Deleted)
Cardiology Office Note   Date:  07/18/2019   ID:  Amber, Bean 03-12-1940, MRN [id number]  PCP:  Veverly Fells, MD  Cardiologist:   No primary care provider on file. Referring:  ***  No chief complaint on file.     History of Present Illness: Amber Bean is a 79 y.o. female who presents for follow up of CAD.  ***     was in the ED last month brought in by her family because of increasingly erratic behavior.   She has been a no show for multiple appointments with me.     She is now living with her daughter.  She seems to be doing relatively well on the other end of this video conference.  She is in no distress.  She is not having any chest pressure, neck or arm discomfort.  She is not describing palpitations, presyncope or syncope.  She has had some lower extremity edema.  She try to keep her feet elevated.  She gets around in the house and does some light household chores.   Past Medical History:  Diagnosis Date  . Anemia, iron deficiency   . Anemia, vitamin B12 deficiency   . BACK PAIN, CHRONIC   . CAD (coronary artery disease)    S/P CABG in 1995. Last cath in 2003 demonstrated left main 20% stenosis, LAD 50-70% mid stenosis, circumflex 40-50% stenosis in the AV groove, 30% right coronary artery stenosis. Saphenous vein graft to the marginal was patent. A LIMA to the LAD was patent.  Marland Kitchen CAROTID ARTERY STENOSIS   . Diabetes mellitus    Diet controlled since weight loss  . GERD (gastroesophageal reflux disease)   . Hypothyroidism   . Mitral valve disorders(424.0)   . Obstructive sleep apnea on CPAP    intol of CPAP - noncompliant  . Osteopenia   . Pancreatitis   . 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 Tobacco abuse    Quit 02/2010    Past Surgical History:  Procedure Laterality Date  . ABDOMINAL HYSTERECTOMY    . APPENDECTOMY    . BREAST LUMPECTOMY    . CARDIAC CATHETERIZATION  2003  . CORONARY ARTERY BYPASS GRAFT  1995     [place], [place]  . LUMBAR FUSION    . SAPHENOUS VEIN GRAFT RESECTION  2003  . TONSILLECTOMY       Current Outpatient Medications  Medication Sig Dispense Refill  . ALPRAZolam (XANAX) 0.5 MG tablet Take 1 tablet by mouth 2 (two) times daily as needed for anxiety.  0  . amitriptyline (ELAVIL) 75 MG tablet Take 75 mg by mouth every evening.    Marland Kitchen aspirin 81 MG tablet Take 81 mg by mouth daily as needed for mild pain.     Marland Kitchen atenolol (TENORMIN) 50 MG tablet TAKE 1.5 TABLETS (75 MG TOTAL) BY MOUTH DAILY. KEEP OV. (Patient taking differently: Take 25-50 mg by mouth 2 (two) times daily. 50 mg in the morning and 25 mg in the evening.) 45 tablet 0  . baclofen (LIORESAL) 10 MG tablet TAKE 1 TABLET BY MOUTH THREE TIMES DAILY. (Patient not taking: No sig reported) 90 tablet 0  . bumetanide (BUMEX) 1 MG tablet TAKE (1) TABLET BY MOUTH TWICE A DAY IF NEEDED. 60 tablet 6  . dicyclomine (BENTYL) 10 MG capsule Take 10 mg by mouth daily as needed for spasms.     Marland Kitchen diltiazem (CARDIZEM CD) 120 MG 24 hr  capsule Take 120 mg by mouth daily.    Marland Kitchen gabapentin (NEURONTIN) 100 MG capsule Take 100 mg by mouth 3 (three) times daily.    Marland Kitchen levothyroxine (SYNTHROID, LEVOTHROID) 112 MCG tablet TAKE (1) TABLET BY MOUTH DAILY (Patient taking differently: Take 112 mcg by mouth daily before breakfast. ) 30 tablet 2  . lisinopril (PRINIVIL,ZESTRIL) 10 MG tablet Take 1 tablet (10 mg total) by mouth daily. 30 tablet 2  . meclizine (ANTIVERT) 25 MG tablet Take 25 mg by mouth daily as needed for dizziness or nausea.     . methocarbamol (ROBAXIN) 750 MG tablet Take 750 mg by mouth 3 (three) times daily as needed for pain.    . nitroGLYCERIN (NITROSTAT) 0.4 MG SL tablet Place 1 tablet (0.4 mg total) under the tongue as needed. 25 tablet prn  . Oxycodone HCl 10 MG TABS Take 10 mg by mouth 4 (four) times daily.    . pantoprazole (PROTONIX) 20 MG tablet Take 1 tablet (20 mg total) by mouth daily. 30 tablet 0  . potassium chloride SA  (K-DUR,KLOR-CON) 20 MEQ tablet TAKE ONE TABLET BY MOUTH TWICE DAILY (Patient taking differently: Take 20 mEq by mouth 2 (two) times daily. ) 180 tablet 3  . risperiDONE (RISPERDAL) 1 MG tablet Take 1 mg by mouth 3 (three) times daily.    Marland Kitchen rOPINIRole (REQUIP) 1 MG tablet TAKE ONE (1) TABLET AT BEDTIME (Patient not taking: Reported on 07/13/2018) 90 tablet 3  . sertraline (ZOLOFT) 100 MG tablet Take 100 mg by mouth daily.     . sucralfate (CARAFATE) 1 GM/10ML suspension Take 10 mLs (1 g total) by mouth 4 (four) times daily -  with meals and at bedtime. (Patient not taking: Reported on 06/04/2018) 420 mL 0   No current facility-administered medications for this visit.    Allergies:   Morphine and related, Nitrofurantoin, Trazodone and nefazodone, Contrast media [iodinated diagnostic agents], Morphine, Ciprocin-fluocin-procin  [fluocinolone], and Ciprofloxacin    ROS:  Please see the history of present illness.   Otherwise, review of systems are positive for {NONE DEFAULTED:18576::"none"}.   All other systems are reviewed and negative.    PHYSICAL EXAM: VS:  There were no vitals taken for this visit. , BMI There is no height or weight on file to calculate BMI. GENERAL:  Well appearing NECK:  No jugular venous distention, waveform within normal limits, carotid upstroke brisk and symmetric, no bruits, no thyromegaly LUNGS:  Clear to auscultation bilaterally CHEST:  Unremarkable HEART:  PMI not displaced or sustained,S1 and S2 within normal limits, no S3, no S4, no clicks, no rubs, *** murmurs ABD:  Flat, positive bowel sounds normal in frequency in pitch, no bruits, no rebound, no guarding, no midline pulsatile mass, no hepatomegaly, no splenomegaly EXT:  2 plus pulses throughout, no edema, no cyanosis no clubbing   ***GENERAL:  Well appearing HEENT:  Pupils equal round and reactive, fundi not visualized, oral mucosa unremarkable NECK:  No jugular venous distention, waveform within normal  limits, carotid upstroke brisk and symmetric, no bruits, no thyromegaly LYMPHATICS:  No cervical, inguinal adenopathy LUNGS:  Clear to auscultation bilaterally BACK:  No CVA tenderness CHEST:  Unremarkable HEART:  PMI not displaced or sustained,S1 and S2 within normal limits, no S3, no S4, no clicks, no rubs, *** murmurs ABD:  Flat, positive bowel sounds normal in frequency in pitch, no bruits, no rebound, no guarding, no midline pulsatile mass, no hepatomegaly, no splenomegaly EXT:  2 plus pulses throughout, no edema,  no cyanosis no clubbing SKIN:  No rashes no nodules NEURO:  Cranial nerves II through XII grossly intact, motor grossly intact throughout PSYCH:  Cognitively intact, oriented to person place and time    EKG:  EKG {ACTION; IS/IS IDP:[id number]} ordered today. The ekg ordered today demonstrates ***   Recent Labs: No results found for requested labs within last 8760 hours.    Lipid Panel    Component Value Date/Time   CHOL 181 05/04/2007 0000   TRIG 264 05/04/2007 0000   HDL 40 05/04/2007 0000   LDLCALC 88 05/04/2007 0000      Wt Readings from Last 3 Encounters:  07/13/18 160 lb (72.6 kg)  06/04/18 160 lb (72.6 kg)  05/04/18 160 lb (72.6 kg)      Other studies Reviewed: Additional studies/ records that were reviewed today include: ***. Review of the above records demonstrates:  Please see elsewhere in the note.  ***   ASSESSMENT AND PLAN:  CAD -  ***  The patient has no new sypmtoms.  No further cardiovascular testing is indicated.  We will continue with aggressive risk reduction and meds as listed.  TOBACCO ABUSE -  ***  She is not smoking.   CAROTID ARTERY STENOSIS -  ***  She has failed to comply with follow up imaging that has previously been scheduled.    MITRAL VALVE DISORDERS - ***  She had mild to mod MR in 2017.   No follow up is indicated    Hyperlipidemia -  ***  She won't take statins. No change in therapy  Hypertension -  The  blood pressure is ***  at target.  No change in therapy.  Edema ***  She was supposed to be on Bumex and I will renew this as needed.  I did review her labs and she is had normal BUN and creatinine in April.  Anemia *** I note that she had a new anemia in April.  Reports some bright red blood on her toilet paper sometimes.  I am and have her get a follow-up CBC.    Current medicines are reviewed at length with the patient today.  The patient {ACTIONS; HAS/DOES NOT HAVE:19233} concerns regarding medicines.  The following changes have been made:  {PLAN; NO CHANGE:13088:s}  Labs/ tests ordered today include: *** No orders of the defined types were placed in this encounter.    Disposition:   FU with ***    Signed, Rollene Rotunda, MD  07/18/2019 8:16 PM    [facility]

## 2019-07-19 ENCOUNTER — Ambulatory Visit: Payer: Commercial Indemnity | Admitting: Cardiology

## 2019-08-06 ENCOUNTER — Telehealth: Payer: Self-pay | Admitting: Student

## 2019-08-06 NOTE — Telephone Encounter (Signed)
   Received page from Answering Service. Daughter called with concerns about patient who is admitted at [facility]. Admitted for UTI and then found to have COVID. Called and spoke with daughter. Daughter very frustrated with care patient has received at [facility] and lack of information being provided. She is concerned about her mother's BP as it has been labile. Not able to see [facility] records at this time. Explained that it is very difficult for me to say anything for certain as I cannot see records from current hospitalization. Advised patient to call and ask to speak with attending physician caring for her mom. Daughter asked if patient could be transferred to [facility], and I explained that she can ask the attending physician there if that would be possible. Daughter would like Dr. Antoine Poche to be aware so I will route this message to him to keep him updated.   Corrin Parker, PA-C 08/06/2019 1:31 PM

## 2019-08-07 NOTE — Telephone Encounter (Signed)
I will ask Pam to call her family.  I am not sure that we could arrange a transfer from a medical service to another medical service.

## 2019-08-10 NOTE — Telephone Encounter (Signed)
Spoke with daughter who reports pt is doing much better.  Heart rate has improved.  She is looking at having her go to rehab after her hospitalization to assist with her being able to regain strength to walk etc.  She will c/b if any further concerns or needs.

## 2019-10-07 ENCOUNTER — Inpatient Hospital Stay (HOSPITAL_COMMUNITY): Payer: Medicare Other

## 2019-10-07 ENCOUNTER — Other Ambulatory Visit: Payer: Self-pay

## 2019-10-07 ENCOUNTER — Encounter (HOSPITAL_COMMUNITY): Payer: Self-pay | Admitting: Emergency Medicine

## 2019-10-07 ENCOUNTER — Emergency Department (HOSPITAL_COMMUNITY): Payer: Medicare Other

## 2019-10-07 ENCOUNTER — Inpatient Hospital Stay (HOSPITAL_COMMUNITY)
Admission: EM | Admit: 2019-10-07 | Discharge: 2019-10-14 | DRG: 871 | Disposition: A | Discharge status: Hospice | Payer: Medicare Other | Source: Skilled Nursing Facility | Attending: Internal Medicine | Admitting: Internal Medicine

## 2019-10-07 DIAGNOSIS — Z79890 Hormone replacement therapy (postmenopausal): Secondary | ICD-10-CM

## 2019-10-07 DIAGNOSIS — E119 Type 2 diabetes mellitus without complications: Secondary | ICD-10-CM | POA: Diagnosis present

## 2019-10-07 DIAGNOSIS — Z8249 Family history of ischemic heart disease and other diseases of the circulatory system: Secondary | ICD-10-CM

## 2019-10-07 DIAGNOSIS — K921 Melena: Secondary | ICD-10-CM

## 2019-10-07 DIAGNOSIS — A419 Sepsis, unspecified organism: Principal | ICD-10-CM | POA: Diagnosis present

## 2019-10-07 DIAGNOSIS — Z8701 Personal history of pneumonia (recurrent): Secondary | ICD-10-CM

## 2019-10-07 DIAGNOSIS — Z79899 Other long term (current) drug therapy: Secondary | ICD-10-CM

## 2019-10-07 DIAGNOSIS — R531 Weakness: Secondary | ICD-10-CM | POA: Diagnosis not present

## 2019-10-07 DIAGNOSIS — Z515 Encounter for palliative care: Secondary | ICD-10-CM | POA: Diagnosis not present

## 2019-10-07 DIAGNOSIS — D649 Anemia, unspecified: Secondary | ICD-10-CM

## 2019-10-07 DIAGNOSIS — Z7401 Bed confinement status: Secondary | ICD-10-CM

## 2019-10-07 DIAGNOSIS — K219 Gastro-esophageal reflux disease without esophagitis: Secondary | ICD-10-CM | POA: Diagnosis present

## 2019-10-07 DIAGNOSIS — E876 Hypokalemia: Secondary | ICD-10-CM | POA: Diagnosis present

## 2019-10-07 DIAGNOSIS — F0150 Vascular dementia without behavioral disturbance: Secondary | ICD-10-CM | POA: Diagnosis not present

## 2019-10-07 DIAGNOSIS — Z951 Presence of aortocoronary bypass graft: Secondary | ICD-10-CM | POA: Diagnosis not present

## 2019-10-07 DIAGNOSIS — R1310 Dysphagia, unspecified: Secondary | ICD-10-CM

## 2019-10-07 DIAGNOSIS — E785 Hyperlipidemia, unspecified: Secondary | ICD-10-CM | POA: Diagnosis present

## 2019-10-07 DIAGNOSIS — Z823 Family history of stroke: Secondary | ICD-10-CM

## 2019-10-07 DIAGNOSIS — E039 Hypothyroidism, unspecified: Secondary | ICD-10-CM | POA: Diagnosis present

## 2019-10-07 DIAGNOSIS — D62 Acute posthemorrhagic anemia: Secondary | ICD-10-CM | POA: Diagnosis present

## 2019-10-07 DIAGNOSIS — R64 Cachexia: Secondary | ICD-10-CM | POA: Diagnosis present

## 2019-10-07 DIAGNOSIS — L89154 Pressure ulcer of sacral region, stage 4: Secondary | ICD-10-CM | POA: Diagnosis present

## 2019-10-07 DIAGNOSIS — K449 Diaphragmatic hernia without obstruction or gangrene: Secondary | ICD-10-CM | POA: Diagnosis present

## 2019-10-07 DIAGNOSIS — R195 Other fecal abnormalities: Secondary | ICD-10-CM | POA: Diagnosis not present

## 2019-10-07 DIAGNOSIS — Z7189 Other specified counseling: Secondary | ICD-10-CM

## 2019-10-07 DIAGNOSIS — K2971 Gastritis, unspecified, with bleeding: Secondary | ICD-10-CM | POA: Diagnosis present

## 2019-10-07 DIAGNOSIS — K31819 Angiodysplasia of stomach and duodenum without bleeding: Secondary | ICD-10-CM | POA: Diagnosis not present

## 2019-10-07 DIAGNOSIS — Z66 Do not resuscitate: Secondary | ICD-10-CM | POA: Diagnosis not present

## 2019-10-07 DIAGNOSIS — I2510 Atherosclerotic heart disease of native coronary artery without angina pectoris: Secondary | ICD-10-CM | POA: Diagnosis present

## 2019-10-07 DIAGNOSIS — I10 Essential (primary) hypertension: Secondary | ICD-10-CM | POA: Diagnosis present

## 2019-10-07 DIAGNOSIS — S31000A Unspecified open wound of lower back and pelvis without penetration into retroperitoneum, initial encounter: Secondary | ICD-10-CM

## 2019-10-07 DIAGNOSIS — E872 Acidosis: Secondary | ICD-10-CM | POA: Diagnosis present

## 2019-10-07 DIAGNOSIS — F0390 Unspecified dementia without behavioral disturbance: Secondary | ICD-10-CM

## 2019-10-07 DIAGNOSIS — Z6823 Body mass index (BMI) 23.0-23.9, adult: Secondary | ICD-10-CM

## 2019-10-07 DIAGNOSIS — Z981 Arthrodesis status: Secondary | ICD-10-CM | POA: Diagnosis not present

## 2019-10-07 DIAGNOSIS — L02212 Cutaneous abscess of back [any part, except buttock]: Secondary | ICD-10-CM | POA: Diagnosis present

## 2019-10-07 DIAGNOSIS — R1312 Dysphagia, oropharyngeal phase: Secondary | ICD-10-CM | POA: Diagnosis present

## 2019-10-07 DIAGNOSIS — E038 Other specified hypothyroidism: Secondary | ICD-10-CM | POA: Diagnosis not present

## 2019-10-07 DIAGNOSIS — Z8616 Personal history of COVID-19: Secondary | ICD-10-CM

## 2019-10-07 DIAGNOSIS — R6520 Severe sepsis without septic shock: Secondary | ICD-10-CM

## 2019-10-07 DIAGNOSIS — I059 Rheumatic mitral valve disease, unspecified: Secondary | ICD-10-CM | POA: Diagnosis present

## 2019-10-07 DIAGNOSIS — Z7982 Long term (current) use of aspirin: Secondary | ICD-10-CM

## 2019-10-07 DIAGNOSIS — Z87891 Personal history of nicotine dependence: Secondary | ICD-10-CM

## 2019-10-07 DIAGNOSIS — K31811 Angiodysplasia of stomach and duodenum with bleeding: Secondary | ICD-10-CM | POA: Diagnosis present

## 2019-10-07 DIAGNOSIS — D509 Iron deficiency anemia, unspecified: Secondary | ICD-10-CM | POA: Diagnosis present

## 2019-10-07 DIAGNOSIS — Z91041 Radiographic dye allergy status: Secondary | ICD-10-CM

## 2019-10-07 LAB — COMPREHENSIVE METABOLIC PANEL
ALT: 32 U/L (ref 0–44)
AST: 18 U/L (ref 15–41)
Albumin: 2 g/dL — ABNORMAL LOW (ref 3.5–5.0)
Alkaline Phosphatase: 85 U/L (ref 38–126)
Anion gap: 10 (ref 5–15)
BUN: 12 mg/dL (ref 8–23)
CO2: 26 mmol/L (ref 22–32)
Calcium: 8.9 mg/dL (ref 8.9–10.3)
Chloride: 103 mmol/L (ref 98–111)
Creatinine, Ser: 0.6 mg/dL (ref 0.44–1.00)
GFR calc Af Amer: >60 mL/min (ref >60)
GFR calc non Af Amer: >60 mL/min (ref >60)
Glucose, Bld: 122 mg/dL — ABNORMAL HIGH (ref 70–99)
Potassium: 3.7 mmol/L (ref 3.5–5.1)
Sodium: 139 mmol/L (ref 135–145)
Total Bilirubin: 0.4 mg/dL (ref 0.3–1.2)
Total Protein: 5.9 g/dL — ABNORMAL LOW (ref 6.5–8.1)

## 2019-10-07 LAB — CBC WITH DIFFERENTIAL/PLATELET
Abs Immature Granulocytes: 0.14 10*3/uL — ABNORMAL HIGH (ref 0.00–0.07)
Basophils Absolute: 0.1 10*3/uL (ref 0.0–0.1)
Basophils Relative: 0 %
Eosinophils Absolute: 0.3 10*3/uL (ref 0.0–0.5)
Eosinophils Relative: 2 %
HCT: 27.5 % — ABNORMAL LOW (ref 36.0–46.0)
Hemoglobin: 7.6 g/dL — ABNORMAL LOW (ref 12.0–15.0)
Immature Granulocytes: 1 %
Lymphocytes Relative: 17 %
Lymphs Abs: 2.4 10*3/uL (ref 0.7–4.0)
MCH: 22.4 pg — ABNORMAL LOW (ref 26.0–34.0)
MCHC: 27.6 g/dL — ABNORMAL LOW (ref 30.0–36.0)
MCV: 81.1 fL (ref 80.0–100.0)
Monocytes Absolute: 0.8 10*3/uL (ref 0.1–1.0)
Monocytes Relative: 5 %
Neutro Abs: 10.9 10*3/uL — ABNORMAL HIGH (ref 1.7–7.7)
Neutrophils Relative %: 75 %
Platelets: 837 10*3/uL — ABNORMAL HIGH (ref 150–400)
RBC: 3.39 MIL/uL — ABNORMAL LOW (ref 3.87–5.11)
RDW: 16.4 % — ABNORMAL HIGH (ref 11.5–15.5)
WBC: 14.6 10*3/uL — ABNORMAL HIGH (ref 4.0–10.5)
nRBC: 0 % (ref 0.0–0.2)

## 2019-10-07 LAB — PROTIME-INR
INR: 1.1 (ref 0.8–1.2)
Prothrombin Time: 13.7 seconds (ref 11.4–15.2)

## 2019-10-07 LAB — CBC
HCT: 27.1 % — ABNORMAL LOW (ref 36.0–46.0)
Hemoglobin: 7.6 g/dL — ABNORMAL LOW (ref 12.0–15.0)
MCH: 22.9 pg — ABNORMAL LOW (ref 26.0–34.0)
MCHC: 28 g/dL — ABNORMAL LOW (ref 30.0–36.0)
MCV: 81.6 fL (ref 80.0–100.0)
Platelets: 775 10*3/uL — ABNORMAL HIGH (ref 150–400)
RBC: 3.32 MIL/uL — ABNORMAL LOW (ref 3.87–5.11)
RDW: 16.4 % — ABNORMAL HIGH (ref 11.5–15.5)
WBC: 20.3 10*3/uL — ABNORMAL HIGH (ref 4.0–10.5)
nRBC: 0 % (ref 0.0–0.2)

## 2019-10-07 LAB — PREPARE RBC (CROSSMATCH)

## 2019-10-07 LAB — ABO/RH: ABO/RH(D): A POS

## 2019-10-07 LAB — LACTIC ACID, PLASMA
Lactic Acid, Venous: 1.2 mmol/L (ref 0.5–1.9)
Lactic Acid, Venous: 2.5 mmol/L (ref 0.5–1.9)
Lactic Acid, Venous: 1.4 mmol/L (ref 0.5–1.9)

## 2019-10-07 LAB — CBG MONITORING, ED: Glucose-Capillary: 123 mg/dL — ABNORMAL HIGH (ref 70–99)

## 2019-10-07 LAB — CREATININE, SERUM
Creatinine, Ser: 0.45 mg/dL (ref 0.44–1.00)
GFR calc Af Amer: >60 mL/min (ref >60)
GFR calc non Af Amer: >60 mL/min (ref >60)

## 2019-10-07 LAB — POC OCCULT BLOOD, ED: Fecal Occult Bld: POSITIVE — AB

## 2019-10-07 LAB — SARS CORONAVIRUS 2 BY RT PCR (HOSPITAL ORDER, PERFORMED IN ~~LOC~~ HOSPITAL LAB): SARS Coronavirus 2: NEGATIVE

## 2019-10-07 LAB — APTT: aPTT: 36 seconds (ref 24–36)

## 2019-10-07 MED ORDER — ACETAMINOPHEN 325 MG PO TABS
650.0000 mg | ORAL_TABLET | Freq: Four times a day (QID) | ORAL | Status: DC | PRN
  Administered 2019-10-09 – 2019-10-14 (×3): 650 mg via ORAL
  Filled 2019-10-07 (×2): qty 2

## 2019-10-07 MED ORDER — LACTATED RINGERS IV BOLUS (SEPSIS)
1000.0000 mL | Freq: Once | INTRAVENOUS | Status: AC
  Administered 2019-10-07: 1000 mL via INTRAVENOUS

## 2019-10-07 MED ORDER — LACTATED RINGERS IV SOLN: INTRAVENOUS | Status: AC

## 2019-10-07 MED ORDER — SODIUM CHLORIDE 0.9% IV SOLUTION: Freq: Once | INTRAVENOUS | Status: AC

## 2019-10-07 MED ORDER — ONDANSETRON HCL 4 MG PO TABS
4.0000 mg | ORAL_TABLET | Freq: Four times a day (QID) | ORAL | Status: DC | PRN
  Administered 2019-10-11: 4 mg via ORAL
  Filled 2019-10-07: qty 1

## 2019-10-07 MED ORDER — SERTRALINE HCL 100 MG PO TABS
100.0000 mg | ORAL_TABLET | Freq: Every day | ORAL | Status: DC
  Administered 2019-10-10 – 2019-10-14 (×4): 100 mg via ORAL
  Filled 2019-10-07 (×6): qty 1

## 2019-10-07 MED ORDER — LACTATED RINGERS IV SOLN: INTRAVENOUS | Status: DC

## 2019-10-07 MED ORDER — VANCOMYCIN HCL 750 MG/150ML IV SOLN
750.0000 mg | Freq: Two times a day (BID) | INTRAVENOUS | Status: DC
  Administered 2019-10-08 – 2019-10-10 (×5): 750 mg via INTRAVENOUS
  Filled 2019-10-07 (×5): qty 150

## 2019-10-07 MED ORDER — PANTOPRAZOLE SODIUM 20 MG PO TBEC: 20.0000 mg | DELAYED_RELEASE_TABLET | Freq: Every day | ORAL | Status: DC

## 2019-10-07 MED ORDER — FERROUS GLUCONATE 324 (38 FE) MG PO TABS
324.0000 mg | ORAL_TABLET | Freq: Three times a day (TID) | ORAL | Status: DC
  Administered 2019-10-09 – 2019-10-13 (×7): 324 mg via ORAL
  Filled 2019-10-07 (×20): qty 1

## 2019-10-07 MED ORDER — POTASSIUM CHLORIDE CRYS ER 20 MEQ PO TBCR: 20.0000 meq | EXTENDED_RELEASE_TABLET | Freq: Two times a day (BID) | ORAL | Status: DC

## 2019-10-07 MED ORDER — SODIUM CHLORIDE 0.9 % IV SOLN
80.0000 mg | Freq: Once | INTRAVENOUS | Status: DC
  Filled 2019-10-07 (×2): qty 80

## 2019-10-07 MED ORDER — METHOCARBAMOL 500 MG PO TABS: 750.0000 mg | ORAL_TABLET | Freq: Four times a day (QID) | ORAL | Status: DC | PRN

## 2019-10-07 MED ORDER — LACTATED RINGERS IV BOLUS (SEPSIS)
250.0000 mL | Freq: Once | INTRAVENOUS | Status: AC
  Administered 2019-10-07: 250 mL via INTRAVENOUS

## 2019-10-07 MED ORDER — VANCOMYCIN HCL IN DEXTROSE 1-5 GM/200ML-% IV SOLN: 1000.0000 mg | Freq: Once | INTRAVENOUS | Status: DC

## 2019-10-07 MED ORDER — DONEPEZIL HCL 5 MG PO TABS
5.0000 mg | ORAL_TABLET | Freq: Every day | ORAL | Status: DC
  Administered 2019-10-07 – 2019-10-13 (×7): 5 mg via ORAL
  Filled 2019-10-07 (×7): qty 1

## 2019-10-07 MED ORDER — LISINOPRIL 5 MG PO TABS
5.0000 mg | ORAL_TABLET | Freq: Every day | ORAL | Status: DC
  Administered 2019-10-10 – 2019-10-14 (×4): 5 mg via ORAL
  Filled 2019-10-07 (×6): qty 1

## 2019-10-07 MED ORDER — DILTIAZEM HCL ER COATED BEADS 120 MG PO CP24
120.0000 mg | ORAL_CAPSULE | Freq: Every day | ORAL | Status: DC
  Administered 2019-10-10 – 2019-10-14 (×4): 120 mg via ORAL
  Filled 2019-10-07 (×8): qty 1

## 2019-10-07 MED ORDER — SODIUM CHLORIDE 0.9 % IV SOLN
8.0000 mg/h | INTRAVENOUS | Status: DC
  Administered 2019-10-08 – 2019-10-10 (×7): 8 mg/h via INTRAVENOUS
  Filled 2019-10-07 (×11): qty 80

## 2019-10-07 MED ORDER — THIAMINE HCL 100 MG PO TABS
100.0000 mg | ORAL_TABLET | Freq: Every day | ORAL | Status: DC
  Administered 2019-10-10 – 2019-10-14 (×4): 100 mg via ORAL
  Filled 2019-10-07 (×6): qty 1

## 2019-10-07 MED ORDER — ZINC SULFATE 220 (50 ZN) MG PO CAPS
220.0000 mg | ORAL_CAPSULE | Freq: Every day | ORAL | Status: DC
  Administered 2019-10-10 – 2019-10-14 (×4): 220 mg via ORAL
  Filled 2019-10-07 (×6): qty 1

## 2019-10-07 MED ORDER — PIPERACILLIN-TAZOBACTAM 3.375 G IVPB
3.3750 g | Freq: Three times a day (TID) | INTRAVENOUS | Status: DC
  Filled 2019-10-07: qty 50

## 2019-10-07 MED ORDER — ROPINIROLE HCL 0.5 MG PO TABS: 0.5000 mg | ORAL_TABLET | Freq: Three times a day (TID) | ORAL | Status: DC

## 2019-10-07 MED ORDER — ATENOLOL 25 MG PO TABS
50.0000 mg | ORAL_TABLET | Freq: Every day | ORAL | Status: DC
  Administered 2019-10-10 – 2019-10-14 (×4): 50 mg via ORAL
  Filled 2019-10-07 (×6): qty 2

## 2019-10-07 MED ORDER — PIPERACILLIN-TAZOBACTAM 3.375 G IVPB 30 MIN
3.3750 g | Freq: Once | INTRAVENOUS | Status: AC
  Administered 2019-10-07: 3.375 g via INTRAVENOUS
  Filled 2019-10-07: qty 50

## 2019-10-07 MED ORDER — VANCOMYCIN HCL 1500 MG/300ML IV SOLN
1500.0000 mg | Freq: Once | INTRAVENOUS | Status: AC
  Administered 2019-10-07: 1500 mg via INTRAVENOUS
  Filled 2019-10-07: qty 300

## 2019-10-07 MED ORDER — ZINC GLUCONATE 50 MG PO TABS: 50.0000 mg | ORAL_TABLET | Freq: Every day | ORAL | Status: DC

## 2019-10-07 MED ORDER — SODIUM CHLORIDE 0.9 % IV SOLN
2.0000 g | INTRAVENOUS | Status: DC
  Administered 2019-10-07 – 2019-10-13 (×7): 2 g via INTRAVENOUS
  Filled 2019-10-07 (×7): qty 2
  Filled 2019-10-07 (×2): qty 20

## 2019-10-07 MED ORDER — KETOROLAC TROMETHAMINE 15 MG/ML IJ SOLN
15.0000 mg | Freq: Four times a day (QID) | INTRAMUSCULAR | Status: AC | PRN
  Administered 2019-10-10 – 2019-10-12 (×5): 15 mg via INTRAVENOUS
  Filled 2019-10-07 (×5): qty 1

## 2019-10-07 MED ORDER — NITROGLYCERIN 0.4 MG SL SUBL: 0.4000 mg | SUBLINGUAL_TABLET | SUBLINGUAL | Status: DC | PRN

## 2019-10-07 MED ORDER — AMITRIPTYLINE HCL 50 MG PO TABS
150.0000 mg | ORAL_TABLET | Freq: Every day | ORAL | Status: DC
  Administered 2019-10-07 – 2019-10-13 (×7): 150 mg via ORAL
  Filled 2019-10-07 (×7): qty 3

## 2019-10-07 MED ORDER — ASPIRIN EC 325 MG PO TBEC: 325.0000 mg | DELAYED_RELEASE_TABLET | Freq: Every day | ORAL | Status: DC

## 2019-10-07 MED ORDER — ENOXAPARIN SODIUM 40 MG/0.4ML ~~LOC~~ SOLN
40.0000 mg | SUBCUTANEOUS | Status: DC
  Administered 2019-10-07: 40 mg via SUBCUTANEOUS
  Filled 2019-10-07: qty 0.4

## 2019-10-07 MED ORDER — ONDANSETRON HCL 4 MG/2ML IJ SOLN
4.0000 mg | Freq: Four times a day (QID) | INTRAMUSCULAR | Status: DC | PRN
  Administered 2019-10-12: 4 mg via INTRAVENOUS
  Filled 2019-10-07: qty 2

## 2019-10-07 MED ORDER — GABAPENTIN 100 MG PO CAPS
100.0000 mg | ORAL_CAPSULE | Freq: Three times a day (TID) | ORAL | Status: DC
  Administered 2019-10-07 – 2019-10-14 (×15): 100 mg via ORAL
  Filled 2019-10-07 (×17): qty 1

## 2019-10-07 MED ORDER — RISPERIDONE 0.5 MG PO TABS
0.5000 mg | ORAL_TABLET | Freq: Two times a day (BID) | ORAL | Status: DC
  Administered 2019-10-07: 0.5 mg via ORAL
  Filled 2019-10-07 (×3): qty 1

## 2019-10-07 MED ORDER — LEVOTHYROXINE SODIUM 25 MCG PO TABS
25.0000 ug | ORAL_TABLET | Freq: Every day | ORAL | Status: DC
  Administered 2019-10-08 – 2019-10-14 (×6): 25 ug via ORAL
  Filled 2019-10-07 (×6): qty 1

## 2019-10-07 MED ORDER — ASCORBIC ACID 500 MG PO TABS
500.0000 mg | ORAL_TABLET | Freq: Every day | ORAL | Status: DC
  Administered 2019-10-10 – 2019-10-14 (×4): 500 mg via ORAL
  Filled 2019-10-07 (×6): qty 1

## 2019-10-07 MED ORDER — PANTOPRAZOLE SODIUM 40 MG IV SOLR: 40.0000 mg | Freq: Two times a day (BID) | INTRAVENOUS | Status: DC

## 2019-10-07 NOTE — H&P (Signed)
History and Physical   Amber Bean GXQ:[id number] DOB: 09-May-1940 DOA: 10/07/2019  Referring MD/NP/PA: Dr. Dalene Seltzer  PCP: Veverly Fells, MD   Outpatient Specialists: None  Patient coming from: Skilled nursing facility  Chief Complaint: Weakness and sacral ulcer  HPI: Amber Bean is a 79 y.o. female with medical history significant of dementia, UTI, COVID-19 pneumonia in June, diabetes, previous tobacco abuse, iron deficiency anemia coronary artery disease, hypertension, sacral decubitus ulcer [facility]  Patient has been there for the time until the daughter went to visit today and found her in the bad shape.  Her mother was weak debilitated and deep sacral decubitus ulcer.  She noticed that they have been changing the dressing but also has been having darker stools with suspected GI bleed.  She is weak debilitated and worse this year last saw her.  She brought her to the ER where she was seen and evaluated.  Patient found to be septic by criteria with a deep sacral decubitus ulcer stage IV as well as guaiac positive stool and anemia.  Surgery and GI were consulted by ER and we have been asked to admit the patient for treatment.  ED Course: Temperature 98.2 blood pressure 89/67 pulse 55 respiratory rate of 27 oxygen sats 94% room air.  White count 20.6 hemoglobin 7.5 platelets 763.  Chemistry relatively normal except for calcium 8.7 glucose 105 INR 1.1 lactic acid of 1.4 and albumin 2.0.  CT abdomen pelvis shows evidence of sacral decubitus ulcer multiple air collection within the subcutaneous fat of midline just deep to the ulceration about 1.7 x 5 cm suspected subcutaneous abscess.  There is circumferential wall thickening over the rectum which may be due to acute cryptitis small bilateral pleural effusion with mild basilar atelectasis.  Patient being admitted with sepsis secondary to infected wounds.  Urinalysis  currently pending.  Review of Systems: As per HPI otherwise 10 point review of systems negative.    Past Medical History:  Diagnosis Date  . Anemia, iron deficiency   . Anemia, vitamin B12 deficiency   . BACK PAIN, CHRONIC   . CAD (coronary artery disease)    S/P CABG in 1995. Last cath in 2003 demonstrated left main 20% stenosis, LAD 50-70% mid stenosis, circumflex 40-50% stenosis in the AV groove, 30% right coronary artery stenosis. Saphenous vein graft to the marginal was patent. A LIMA to the LAD was patent.  Marland Kitchen CAROTID ARTERY STENOSIS   . Diabetes mellitus    Diet controlled since weight loss  . GERD (gastroesophageal reflux disease)   . Hypothyroidism   . Mitral valve disorders(424.0)   . Obstructive sleep apnea on CPAP    intol of CPAP - noncompliant  . Osteopenia   . Pancreatitis   . Pseudoaneurysm [facility])    Requiring repair following last cath  . Tobacco abuse    Quit 02/2010    Past Surgical History:  Procedure Laterality Date  . ABDOMINAL HYSTERECTOMY    . APPENDECTOMY    . BREAST LUMPECTOMY    . CARDIAC CATHETERIZATION  2003  . CORONARY ARTERY BYPASS GRAFT  1995   [place], [place]  . LUMBAR FUSION    . SAPHENOUS VEIN GRAFT RESECTION  2003  . TONSILLECTOMY       reports that she quit smoking about 9 years ago. Her smoking use included cigarettes. She smoked 1.00 pack per day. She has never used smokeless tobacco. She reports that she  does not drink alcohol and does not use drugs.  Allergies  Allergen Reactions  . Morphine And Related Other (See Comments) and Hives    "makes me mean as a snake"   . Nitrofurantoin Other (See Comments)    Ruptured tendin  Ruptured tendin   . Trazodone And Nefazodone Rash  . Contrast Media [Iodinated Diagnostic Agents] Other (See Comments)    AFIB  . Morphine     agression  . Ciprocin-Fluocin-Procin  [Fluocinolone] Rash  . Ciprofloxacin Rash    Family History  Problem Relation Age of Onset  . Hypertension Father   .  Heart disease Mother   . Stroke Mother   . Heart disease Brother   . Stroke Maternal Grandfather   . Hypertension Other   . Hyperlipidemia Other   . Stroke Other      Prior to Admission medications   Medication Sig Start Date End Date Taking? Authorizing Provider  acetaminophen (TYLENOL) 325 MG tablet Take 650 mg by mouth every 6 (six) hours as needed for mild pain.   Yes [provider]  amitriptyline (ELAVIL) 150 MG tablet Take 150 mg by mouth at bedtime.   Yes [provider]  ascorbic acid (VITAMIN C) 500 MG tablet Take 500 mg by mouth daily.   Yes [provider]  aspirin 325 MG EC tablet Take 325 mg by mouth daily.   Yes [provider]  atenolol (TENORMIN) 50 MG tablet TAKE 1.5 TABLETS (75 MG TOTAL) BY MOUTH DAILY. KEEP OV. Patient taking differently: Take 50 mg by mouth daily.  06/28/17  Yes Rollene RotundaHochrein, James, MD  collagenase (SANTYL) ointment Apply 1 application topically daily.   Yes [provider]  diltiazem (CARDIZEM CD) 120 MG 24 hr capsule Take 120 mg by mouth daily.   Yes [provider]  donepezil (ARICEPT) 5 MG tablet Take 5 mg by mouth at bedtime.   Yes [provider]  ferrous gluconate (FERGON) 324 MG tablet Take 324 mg by mouth 3 (three) times daily with meals.   Yes [provider]  gabapentin (NEURONTIN) 100 MG capsule Take 100 mg by mouth 3 (three) times daily.   Yes [provider]  gabapentin (NEURONTIN) 100 MG capsule Take 100 mg by mouth 3 (three) times daily.   Yes [provider]  levothyroxine (SYNTHROID) 25 MCG tablet Take 25 mcg by mouth daily before breakfast.   Yes [provider]  lisinopril (ZESTRIL) 5 MG tablet Take 5 mg by mouth daily.   Yes [provider]  nitroGLYCERIN (NITROSTAT) 0.4 MG SL tablet Place 1 tablet (0.4 mg total) under the tongue as needed. 07/13/18  Yes Rollene RotundaHochrein, James, MD  pantoprazole (PROTONIX) 20 MG tablet Take 1 tablet (20 mg  total) by mouth daily. 04/29/17 10/07/19 Yes Long, Arlyss RepressJoshua G, MD  risperiDONE (RISPERDAL) 0.5 MG tablet Take 0.5 mg by mouth 2 (two) times daily.   Yes [provider]  sertraline (ZOLOFT) 100 MG tablet Take 100 mg by mouth daily.  04/11/18  Yes [provider]  sodium hypochlorite (DAKIN'S 1/4 STRENGTH) 0.125 % SOLN Irrigate with 1 application as directed daily.   Yes [provider]  thiamine 100 MG tablet Take 100 mg by mouth daily.   Yes [provider]  zinc gluconate 50 MG tablet Take 50 mg by mouth daily.   Yes [provider]  ALPRAZolam Prudy Feeler(XANAX) 0.5 MG tablet Take 1 tablet by mouth 2 (two) times daily as needed for anxiety. Patient  not taking: Reported on 10/07/2019 04/09/17   [provider]  amitriptyline (ELAVIL) 75 MG tablet Take 75 mg by mouth every evening. Patient not taking: Reported on 10/07/2019 04/08/18   [provider]  aspirin 81 MG tablet Take 81 mg by mouth daily as needed for mild pain.  Patient not taking: Reported on 10/07/2019    [provider]  baclofen (LIORESAL) 10 MG tablet TAKE 1 TABLET BY MOUTH THREE TIMES DAILY. Patient not taking: No sig reported 06/09/16   Elenora Gamma, MD  bumetanide (BUMEX) 1 MG tablet TAKE (1) TABLET BY MOUTH TWICE A DAY IF NEEDED. Patient not taking: Reported on 10/07/2019 07/13/18   Rollene Rotunda, MD  dicyclomine (BENTYL) 10 MG capsule Take 10 mg by mouth daily as needed for spasms.  Patient not taking: Reported on 10/07/2019 01/12/13   [provider]  levothyroxine (SYNTHROID, LEVOTHROID) 112 MCG tablet TAKE (1) TABLET BY MOUTH DAILY Patient not taking: Reported on 10/07/2019 11/12/16   Elenora Gamma, MD  lisinopril (PRINIVIL,ZESTRIL) 10 MG tablet Take 1 tablet (10 mg total) by mouth daily. Patient not taking: Reported on 10/07/2019 11/12/16   Elenora Gamma, MD  meclizine (ANTIVERT) 25 MG tablet Take 25 mg by mouth daily as needed for dizziness or  nausea.  Patient not taking: Reported on 10/07/2019    [provider]  methocarbamol (ROBAXIN) 750 MG tablet Take 750 mg by mouth 3 (three) times daily as needed for pain. Patient not taking: Reported on 10/07/2019 04/29/18   [provider]  Oxycodone HCl 10 MG TABS Take 10 mg by mouth 4 (four) times daily. Patient not taking: Reported on 10/07/2019 02/14/18   [provider]  potassium chloride SA (K-DUR,KLOR-CON) 20 MEQ tablet TAKE ONE TABLET BY MOUTH TWICE DAILY Patient not taking: Reported on 10/07/2019 09/10/15   Elenora Gamma, MD  risperiDONE (RISPERDAL) 1 MG tablet Take 1 mg by mouth 3 (three) times daily. Patient not taking: Reported on 10/07/2019    [provider]  rOPINIRole (REQUIP) 1 MG tablet TAKE ONE (1) TABLET AT BEDTIME Patient not taking: Reported on 07/13/2018 09/10/15   Elenora Gamma, MD    Physical Exam: Vitals:   10/08/19 0130 10/08/19 0145 10/08/19 0358 10/08/19 0454  BP: (!) 141/85 (!) 128/46 (!) 127/46 (!) 112/40  Pulse:  74 69 64  Resp:  19 (!) 27 12  Temp:  98 F (36.7 C) 98.9 F (37.2 C) 98.2 F (36.8 C)  TempSrc:  Oral Axillary Axillary  SpO2:  98% 100% 98%  Weight:      Height:          Constitutional: Chronically ill looking, confused, no distress Vitals:   10/08/19 0130 10/08/19 0145 10/08/19 0358 10/08/19 0454  BP: (!) 141/85 (!) 128/46 (!) 127/46 (!) 112/40  Pulse:  74 69 64  Resp:  19 (!) 27 12  Temp:  98 F (36.7 C) 98.9 F (37.2 C) 98.2 F (36.8 C)  TempSrc:  Oral Axillary Axillary  SpO2:  98% 100% 98%  Weight:      Height:       Eyes: PERRL, lids and conjunctivae normal ENMT: Mucous membranes are dry. Posterior pharynx clear of any exudate or lesions.Normal dentition.  Neck: normal, supple, no masses, no thyromegaly Respiratory: clear to auscultation bilaterally, no wheezing, no crackles. Normal respiratory effort. No accessory muscle use.  Cardiovascular: Regular rate and rhythm, no murmurs  / rubs / gallops. No extremity edema. 2+ pedal  pulses. No carotid bruits.  Abdomen: no tenderness, no masses palpated. No hepatosplenomegaly. Bowel sounds positive.  Large stage IV sacral decubitus ulcer with eschar Musculoskeletal: no clubbing / cyanosis. No joint deformity upper and lower extremities. Good ROM, no contractures. Normal muscle tone.  Skin: no rashes, lesions, ulcers. No induration Neurologic: CN 2-12 grossly intact. Sensation intact, DTR normal. Strength 5/5 in all 4.  Psychiatric: Confused, demented    Labs on Admission: I have personally reviewed following labs and imaging studies  CBC: Recent Labs  Lab 10/07/19 1507 10/07/19 2157 10/08/19 0036  WBC 14.6* 20.3* 20.6*  NEUTROABS 10.9*  --   --   HGB 7.6* 7.6* 7.5*  HCT 27.5* 27.1* 26.4*  MCV 81.1 81.6 80.0  PLT 837* 775* 763*   Basic Metabolic Panel: Recent Labs  Lab 10/07/19 1507 10/07/19 2157 10/08/19 0036  NA 139  --  138  K 3.7  --  4.3  CL 103  --  103  CO2 26  --  26  GLUCOSE 122*  --  105*  BUN 12  --  11  CREATININE 0.60 0.45 0.54  CALCIUM 8.9  --  8.7*   GFR: Estimated Creatinine Clearance: 57.3 mL/min (by C-G formula based on SCr of 0.54 mg/dL). Liver Function Tests: Recent Labs  Lab 10/07/19 1507 10/08/19 0036  AST 18 20  ALT 32 31  ALKPHOS 85 82  BILITOT 0.4 0.5  PROT 5.9* 5.7*  ALBUMIN 2.0* 2.0*   No results for input(s): LIPASE, AMYLASE in the last 168 hours. No results for input(s): AMMONIA in the last 168 hours. Coagulation Profile: Recent Labs  Lab 10/07/19 1700 10/08/19 0036  INR 1.1 1.1   Cardiac Enzymes: No results for input(s): CKTOTAL, CKMB, CKMBINDEX, TROPONINI in the last 168 hours. BNP (last 3 results) No results for input(s): PROBNP in the last 8760 hours. HbA1C: No results for input(s): HGBA1C in the last 72 hours. CBG: Recent Labs  Lab 10/07/19 1441  GLUCAP 123*   Lipid Profile: No results for input(s): CHOL, HDL, LDLCALC, TRIG, CHOLHDL, LDLDIRECT  in the last 72 hours. Thyroid Function Tests: No results for input(s): TSH, T4TOTAL, FREET4, T3FREE, THYROIDAB in the last 72 hours. Anemia Panel: No results for input(s): VITAMINB12, FOLATE, FERRITIN, TIBC, IRON, RETICCTPCT in the last 72 hours. Urine analysis:    Component Value Date/Time   COLORURINE YELLOW 06/05/2018 1155   APPEARANCEUR HAZY (A) 06/05/2018 1155   LABSPEC 1.009 06/05/2018 1155   PHURINE 6.0 06/05/2018 1155   GLUCOSEU NEGATIVE 06/05/2018 1155   HGBUR NEGATIVE 06/05/2018 1155   BILIRUBINUR NEGATIVE 06/05/2018 1155   KETONESUR NEGATIVE 06/05/2018 1155   PROTEINUR NEGATIVE 06/05/2018 1155   UROBILINOGEN 0.2 04/02/2008 1214   NITRITE POSITIVE (A) 06/05/2018 1155   LEUKOCYTESUR SMALL (A) 06/05/2018 1155   Sepsis Labs: (procalcitonin:4,lacticidven:4) ) Recent Results (from the past 240 hour(s))  SARS Coronavirus 2 by RT PCR (hospital order, performed in [facility] hospital lab) Nasopharyngeal Nasopharyngeal Swab     Status: None   Collection Time: 10/07/19  4:31 PM   Specimen: Nasopharyngeal Swab  Result Value Ref Range Status   SARS Coronavirus 2 NEGATIVE NEGATIVE Final    Comment: (NOTE) SARS-CoV-2 target nucleic acids are NOT DETECTED.  The SARS-CoV-2 RNA is generally detectable in upper and lower respiratory specimens during the acute phase of infection. The lowest concentration of SARS-CoV-2 viral copies this assay can detect is 250 copies / mL. A negative result does not preclude SARS-CoV-2 infection and should not  be used as the sole basis for treatment or other patient management decisions.  A negative result may occur with improper specimen collection / handling, submission of specimen other than nasopharyngeal swab, presence of viral mutation(s) within the areas targeted by this assay, and inadequate number of viral copies (<250 copies / mL). A negative result must be combined with clinical observations, patient history, and epidemiological  information.  Fact Sheet for Patients:   BoilerBrush.com.cy  Fact Sheet for Healthcare Providers: https://pope.com/  This test is not yet approved or  cleared by the [place] FDA and has been authorized for detection and/or diagnosis of SARS-CoV-2 by FDA under an Emergency Use Authorization (EUA).  This EUA will remain in effect (meaning this test can be used) for the duration of the COVID-19 declaration under Section 564(b)(1) of the Act, 21 U.S.C. section 360bbb-3(b)(1), unless the authorization is terminated or revoked sooner.  Performed at [facility], [address], [place], [place] [address]      Radiological Exams on Admission: CT ABDOMEN PELVIS WO CONTRAST  Result Date: 10/07/2019 CLINICAL DATA:  Possible intra-abdominal abscess. Sacral wound. Sepsis. EXAM: CT ABDOMEN AND PELVIS WITHOUT CONTRAST TECHNIQUE: Multidetector CT imaging of the abdomen and pelvis was performed following the standard protocol without IV contrast. COMPARISON:  06/04/2018 FINDINGS: Lower chest: Lung bases demonstrate small bilateral pleural effusions. Mild bibasilar dependent atelectasis. Calcification of the mitral valve annulus. Hepatobiliary: Previous cholecystectomy. Liver and biliary tree are normal. Pancreas: Normal. Spleen: Normal. Adrenals/Urinary Tract: Adrenal glands are normal. Kidneys are normal in size without hydronephrosis. No focal mass or nephrolithiasis. Ureters and bladder are unremarkable. Stomach/Bowel: Stomach and small bowel are normal. Previous appendectomy. Mild wall thickening over the rectum which may be due to proctitis. Colon is otherwise unremarkable. Vascular/Lymphatic: Calcified plaque over the abdominal aorta which is normal in caliber. No evidence of adenopathy. Reproductive: Previous hysterectomy. Other: No free fluid or focal inflammatory change within the abdomen. Musculoskeletal: Evidence of patient's known midline  sacral decubitus ulcer. Mottled air collection within the subcutaneous fat right of midline just deep to the ulceration measuring 1.6 x 5 cm possibly developing subcutaneous abscess. Area of ulceration extends up to the superficial surface of the sacrum. Suggestion of minimal bone destruction of the adjacent right side of the sacrum likely representing osteomyelitis. Degenerative change of the spine with posterior fusion hardware intact from L4-S1. IMPRESSION: 1. Evidence of patient's known midline sacral decubitus ulcer. Mottled air collection within the subcutaneous fat right of midline just deep to the ulceration measuring 1.6 x 5 cm possibly developing subcutaneous abscess. Suggestion of minimal bone destruction of the adjacent right side of the sacrum likely representing osteomyelitis. 2. Circumferential wall thickening over the rectum which may be due to acute proctitis. 3. Small bilateral pleural effusions with mild bibasilar atelectasis. 4. Aortic atherosclerosis. Aortic Atherosclerosis (ICD10-I70.0). Electronically Signed   By: Elberta Fortis M.D.   On: 10/07/2019 19:53   DG Chest Port 1 View  Result Date: 10/07/2019 CLINICAL DATA:  Questionable sepsis.  Evaluate for pneumonia. EXAM: PORTABLE CHEST 1 VIEW COMPARISON:  06/05/2018 FINDINGS: Patient rotated left. The Chin overlies the left apex. Prior median sternotomy. Cardiomegaly accentuated by AP portable technique. No pleural effusion or pneumothorax. Diffuse peribronchial thickening. Vague increased density projecting over the left lung base, favored to be due to overlying soft tissues. Clear right lung. IMPRESSION: 1. No acute cardiopulmonary disease. 2. Peribronchial thickening which may relate to chronic bronchitis or smoking. 3. Likely artifactual density projecting over the  left lung base on this oblique portable radiograph. If left lower lobe pneumonia is a concern, consider PA and lateral radiographs. Electronically Signed   By: Jeronimo Greaves  M.D.   On: 10/07/2019 16:44     Assessment/Plan Principal Problem:   Sepsis (HCC) Active Problems:   Hypothyroidism   T2DM (type 2 diabetes mellitus) (HCC)   ANEMIA-IRON DEFICIENCY   CAD (coronary artery disease)   Mitral valve disease   Coronary artery disease involving native coronary artery of native heart without angina pectoris   Essential hypertension   Dyslipidemia   Sacral decubitus ulcer, stage IV (HCC)     #1 sacral decubitus ulcer stage IV: Patient being admitted.  Dr. Derrell Lolling of general surgery consulted for possible debridement.  Started on IV antibiotics to cover sepsis.  Wound care per surgery.  #2 sepsis due to infected sacral ulcer: Patient on sepsis protocol.  We will also consider additional urinalysis to rule out UTI as another cause.  Continue antibiotics and protocol.  #3 diabetes: Sliding scale insulin  #4 dementia: At baseline.  Continue treatment.  #5 coronary artery disease: Stable at baseline.  Continue treatment  #6 essential hypertension: Patient has become hypotensive tonight.  Hold blood pressure medications.  Fluid resuscitation.  #7 hypothyroidism: Replete  #8 guaiac positive stool: Suspected ongoing GI bleed.  IV Protonix.  GI consults.  #9 anemia of acute blood loss: Most likely to GI bleed.  Check H&H in the morning and transfuse if indicated.   DVT prophylaxis: SCD Code Status: Full code Family Communication: Daughter Disposition Plan: To skilled facility Consults called: Dr. Derrell Lolling of general surgery Admission status: Inpatient to progressive  Severity of Illness: The appropriate patient status for this patient is INPATIENT. Inpatient status is judged to be reasonable and necessary in order to provide the required intensity of service to ensure the patient's safety. The patient's presenting symptoms, physical exam findings, and initial radiographic and laboratory data in the context of their chronic comorbidities is felt to place  them at high risk for further clinical deterioration. Furthermore, it is not anticipated that the patient will be medically stable for discharge from the hospital within 2 midnights of admission. The following factors support the patient status of inpatient.   " The patient's presenting symptoms include weakness confusion was sepsis. " The worrisome physical exam findings include deep stage IV sacral decubitus ulcer. " The initial radiographic and laboratory data are worrisome because of CT showing possible subcutaneous abscess. " The chronic co-morbidities include diabetes hypertension and dementia.   * I certify that at the point of admission it is my clinical judgment that the patient will require inpatient hospital care spanning beyond 2 midnights from the point of admission due to high intensity of service, high risk for further deterioration and high frequency of surveillance required.Lonia Blood MD [facility] Pager [phone]  If 7PM-7AM, please contact night-coverage [email or web address] Password [facility]  10/08/2019, 6:22 AM

## 2019-10-07 NOTE — ED Notes (Signed)
Per Dr. Dalene Seltzer, pt's daughter consented for blood transfusion.

## 2019-10-07 NOTE — Progress Notes (Signed)
Notified provider and bedside nurse of need to order and draw repeat lactic acid.  Pt meets criteria for 3rd lactic to be drawn since 2nd lactic was greater than 2 and resulting higher than first reading. Message sent to MD and bedside RN asking for 3rd lactic to be ordered.

## 2019-10-07 NOTE — Progress Notes (Signed)
Pharmacy Antibiotic Note  Amber Bean is a 79 y.o. female admitted on 10/07/2019 with sepsis.  Pharmacy has been consulted for Zosyn and Vancomycin dosing.      Temp (24hrs), Avg:98.6 F (37 C), Min:97.5 F (36.4 C), Max:99.6 F (37.6 C)  Recent Labs  Lab 10/07/19 1506 10/07/19 1507 10/07/19 1700  WBC  --  14.6*  --   CREATININE  --  0.60  --   LATICACIDVEN 1.2  --  2.5*    CrCl cannot be calculated (Unknown ideal weight.).    Allergies  Allergen Reactions   Morphine And Related Other (See Comments) and Hives    "makes me mean as a snake"    Nitrofurantoin Other (See Comments)    Ruptured tendin  Ruptured tendin    Trazodone And Nefazodone Rash   Contrast Media [Iodinated Diagnostic Agents] Other (See Comments)    AFIB   Morphine     agression   Ciprocin-Fluocin-Procin  [Fluocinolone] Rash   Ciprofloxacin Rash    Antimicrobials this admission: 8/14 Zosyn >>  8/14 Vancomycin >>   Dose adjustments this admission: N/a  Microbiology results: Pending   Plan:  - Zosyn 3.375g IV x 1 dose over 30 min followed by Zosyn 3.375g IV every 8 hours infused over 4 hours  - Vancomycin 1500mg  IV x 1 dose  - Followed by Vancomycin 750mg  IV q12h - Monitor patients renal function and urine output  - De-escalate ABX when appropriate   Thank you for allowing pharmacy to be a part of this patients care.  PharmD. BCPS 10/07/2019 7:53 PM

## 2019-10-07 NOTE — ED Provider Notes (Signed)
[facility] EMERGENCY DEPARTMENT Provider Note   CSN: [id number] Arrival date & time: 10/07/19  1427     History Chief Complaint  Patient presents with  . sacral wound    Amber Bean is a 79 y.o. female.  HPI      79yo female with history of atrial fibrillation on aspirin, admission to [facility] for UTI/colitiis/COVID sepsis in June, dementia, hypertension, CAD presents with concern for sacral ulcer after daughter had seen it while checking on her mother at [facility] facility.    June 9th was in the hospital at [facility] with UTI/COVID 15 days after that was placed at [facility], then went to [facility] rehab, and had small bed sore at time of arrival then  Daughter has been going every day to see her Burgess Estelle she was complaining about pain to her sacrum, and daughter lifted her up and had not seen ulcer before, requested to see the bed sore and was shocked by its appearance and requested she be sent to the ED.  Did noticed stool was darker last night when they changed her.  Has been sleepier, not sure if related to risperdol, no other acute changes   Per nurse at facility--On zinc, changed bed, recently saw wound dr. And had packing placed this week. Worsening over the last 1-2 weeks despite wound care efforts. She denies seeing any black or bloody stools/denies vomiting/diarrhea/fevers or other changes  History limited by patient dementia. She is oriented to self   Past Medical History:  Diagnosis Date  . Anemia, iron deficiency   . Anemia, vitamin B12 deficiency   . BACK PAIN, CHRONIC   . CAD (coronary artery disease)    S/P CABG in 1995. Last cath in 2003 demonstrated left main 20% stenosis, LAD 50-70% mid stenosis, circumflex 40-50% stenosis in the AV groove, 30% right coronary artery stenosis. Saphenous vein graft to the marginal was patent. A LIMA to the LAD was patent.  Marland Kitchen CAROTID ARTERY STENOSIS   . Diabetes mellitus    Diet controlled since  weight loss  . GERD (gastroesophageal reflux disease)   . Hypothyroidism   . Mitral valve disorders(424.0)   . Obstructive sleep apnea on CPAP    intol of CPAP - noncompliant  . Osteopenia   . Pancreatitis   . Pseudoaneurysm [facility])    Requiring repair following last cath  . Tobacco abuse    Quit 02/2010    Patient Active Problem List   Diagnosis Date Noted  . Sepsis (HCC) 10/07/2019  . Coronary artery disease involving native coronary artery of native heart without angina pectoris 07/13/2018  . Essential hypertension 07/13/2018  . Dyslipidemia 07/13/2018  . Edema 07/13/2018  . Educated about COVID-19 virus infection 07/13/2018  . Hyperlipidemia 08/06/2010  . DYSPHAGIA UNSPECIFIED 04/03/2010  . ANEMIA-IRON DEFICIENCY 10/07/2009  . GERD 10/07/2009  . KIDNEY DISEASE 10/07/2009  . Chronic back pain 10/07/2009  . TOBACCO ABUSE 02/27/2009  . MITRAL VALVE DISORDERS 02/27/2009  . CAROTID ARTERY STENOSIS 02/27/2009  . HYPOTHYROIDISM 12/31/2008  . T2DM (type 2 diabetes mellitus) (HCC) 12/31/2008  . CAD (coronary artery disease) 12/31/2008  . PANCREATITIS 12/31/2008  . SLEEP APNEA 12/31/2008    Past Surgical History:  Procedure Laterality Date  . ABDOMINAL HYSTERECTOMY    . APPENDECTOMY    . BREAST LUMPECTOMY    . CARDIAC CATHETERIZATION  2003  . CORONARY ARTERY BYPASS GRAFT  1995   [place], [place]  . LUMBAR FUSION    .  SAPHENOUS VEIN GRAFT RESECTION  2003  . TONSILLECTOMY       OB History   No obstetric history on file.     Family History  Problem Relation Age of Onset  . Hypertension Father   . Heart disease Mother   . Stroke Mother   . Heart disease Brother   . Stroke Maternal Grandfather   . Hypertension Other   . Hyperlipidemia Other   . Stroke Other     Social History   Tobacco Use  . Smoking status: Former Smoker    Packs/day: 1.00    Types: Cigarettes    Quit date: 01/23/2010    Years since quitting: 9.7  . Smokeless tobacco: Never Used  .  Tobacco comment: Quit smoking years ago then resumed smoking in 2009  Substance Use Topics  . Alcohol use: No  . Drug use: No    Home Medications Prior to Admission medications   Medication Sig Start Date End Date Taking? Authorizing Provider  acetaminophen (TYLENOL) 325 MG tablet Take 650 mg by mouth every 6 (six) hours as needed for mild pain.   Yes [provider]  amitriptyline (ELAVIL) 150 MG tablet Take 150 mg by mouth at bedtime.   Yes [provider]  ascorbic acid (VITAMIN C) 500 MG tablet Take 500 mg by mouth daily.   Yes [provider]  aspirin 325 MG EC tablet Take 325 mg by mouth daily.   Yes [provider]  atenolol (TENORMIN) 50 MG tablet TAKE 1.5 TABLETS (75 MG TOTAL) BY MOUTH DAILY. KEEP OV. Patient taking differently: Take 50 mg by mouth daily.  06/28/17  Yes Rollene Rotunda, MD  collagenase (SANTYL) ointment Apply 1 application topically daily.   Yes [provider]  diltiazem (CARDIZEM CD) 120 MG 24 hr capsule Take 120 mg by mouth daily.   Yes [provider]  donepezil (ARICEPT) 5 MG tablet Take 5 mg by mouth at bedtime.   Yes [provider]  ferrous gluconate (FERGON) 324 MG tablet Take 324 mg by mouth 3 (three) times daily with meals.   Yes [provider]  gabapentin (NEURONTIN) 100 MG capsule Take 100 mg by mouth 3 (three) times daily.   Yes [provider]  gabapentin (NEURONTIN) 100 MG capsule Take 100 mg by mouth 3 (three) times daily.   Yes [provider]  levothyroxine (SYNTHROID) 25 MCG tablet Take 25 mcg by mouth daily before breakfast.   Yes [provider]  lisinopril (ZESTRIL) 5 MG tablet Take 5 mg by mouth daily.   Yes [provider]  nitroGLYCERIN (NITROSTAT) 0.4 MG SL tablet Place 1 tablet (0.4 mg total) under the tongue as needed. 07/13/18  Yes Rollene Rotunda, MD  pantoprazole (PROTONIX) 20 MG tablet Take 1 tablet (20 mg total) by mouth daily.  04/29/17 10/07/19 Yes Long, Arlyss Repress, MD  risperiDONE (RISPERDAL) 0.5 MG tablet Take 0.5 mg by mouth 2 (two) times daily.   Yes [provider]  sertraline (ZOLOFT) 100 MG tablet Take 100 mg by mouth daily.  04/11/18  Yes [provider]  sodium hypochlorite (DAKIN'S 1/4 STRENGTH) 0.125 % SOLN Irrigate with 1 application as directed daily.   Yes [provider]  thiamine 100 MG tablet Take 100 mg by mouth daily.   Yes [provider]  zinc gluconate 50 MG tablet Take 50 mg by mouth daily.   Yes [provider]  ALPRAZolam Prudy Feeler) 0.5 MG tablet Take 1 tablet by  mouth 2 (two) times daily as needed for anxiety. Patient not taking: Reported on 10/07/2019 04/09/17   [provider]  amitriptyline (ELAVIL) 75 MG tablet Take 75 mg by mouth every evening. Patient not taking: Reported on 10/07/2019 04/08/18   [provider]  aspirin 81 MG tablet Take 81 mg by mouth daily as needed for mild pain.  Patient not taking: Reported on 10/07/2019    [provider]  baclofen (LIORESAL) 10 MG tablet TAKE 1 TABLET BY MOUTH THREE TIMES DAILY. Patient not taking: No sig reported 06/09/16   Elenora Gamma, MD  bumetanide (BUMEX) 1 MG tablet TAKE (1) TABLET BY MOUTH TWICE A DAY IF NEEDED. Patient not taking: Reported on 10/07/2019 07/13/18   Rollene Rotunda, MD  dicyclomine (BENTYL) 10 MG capsule Take 10 mg by mouth daily as needed for spasms.  Patient not taking: Reported on 10/07/2019 01/12/13   [provider]  levothyroxine (SYNTHROID, LEVOTHROID) 112 MCG tablet TAKE (1) TABLET BY MOUTH DAILY Patient not taking: Reported on 10/07/2019 11/12/16   Elenora Gamma, MD  lisinopril (PRINIVIL,ZESTRIL) 10 MG tablet Take 1 tablet (10 mg total) by mouth daily. Patient not taking: Reported on 10/07/2019 11/12/16   Elenora Gamma, MD  meclizine (ANTIVERT) 25 MG tablet Take 25 mg by mouth daily as needed for dizziness or nausea.  Patient not taking:  Reported on 10/07/2019    [provider]  methocarbamol (ROBAXIN) 750 MG tablet Take 750 mg by mouth 3 (three) times daily as needed for pain. Patient not taking: Reported on 10/07/2019 04/29/18   [provider]  Oxycodone HCl 10 MG TABS Take 10 mg by mouth 4 (four) times daily. Patient not taking: Reported on 10/07/2019 02/14/18   [provider]  potassium chloride SA (K-DUR,KLOR-CON) 20 MEQ tablet TAKE ONE TABLET BY MOUTH TWICE DAILY Patient not taking: Reported on 10/07/2019 09/10/15   Elenora Gamma, MD  risperiDONE (RISPERDAL) 1 MG tablet Take 1 mg by mouth 3 (three) times daily. Patient not taking: Reported on 10/07/2019    [provider]  rOPINIRole (REQUIP) 1 MG tablet TAKE ONE (1) TABLET AT BEDTIME Patient not taking: Reported on 07/13/2018 09/10/15   Elenora Gamma, MD    Allergies    Morphine and related, Nitrofurantoin, Trazodone and nefazodone, Contrast media [iodinated diagnostic agents], Morphine, Ciprocin-fluocin-procin  [fluocinolone], and Ciprofloxacin  Review of Systems   Review of Systems  Unable to perform ROS: Dementia    Physical Exam Updated Vital Signs BP (!) 111/48   Pulse 60   Temp 99.6 F (37.6 C) (Rectal)   Resp (!) 21   SpO2 98%   Physical Exam Vitals and nursing note reviewed.  Constitutional:      General: She is not in acute distress.    Appearance: Normal appearance. She is not ill-appearing, toxic-appearing or diaphoretic.  HENT:     Head: Normocephalic.  Eyes:     Conjunctiva/sclera: Conjunctivae normal.  Cardiovascular:     Rate and Rhythm: Normal rate and regular rhythm.     Pulses: Normal pulses.  Pulmonary:     Effort: Pulmonary effort is normal. No respiratory distress.  Musculoskeletal:        General: Tenderness present. No deformity or signs of injury.     Cervical back: No rigidity.  Skin:    General: Skin is warm and dry.     Coloration: Skin is not jaundiced or pale.     Comments:  10cm sacral wound,  necrotic, purulent drainage, surrounding erythema  Neurological:     General: No focal deficit present.     Mental Status: She is alert.     Comments: Oriented to self         ED Results / Procedures / Treatments   Labs (all labs ordered are listed, but only abnormal results are displayed) Labs Reviewed  LACTIC ACID, PLASMA - Abnormal; Notable for the following components:      Result Value   Lactic Acid, Venous 2.5 (*)    All other components within normal limits  COMPREHENSIVE METABOLIC PANEL - Abnormal; Notable for the following components:   Glucose, Bld 122 (*)    Total Protein 5.9 (*)    Albumin 2.0 (*)    All other components within normal limits  CBC WITH DIFFERENTIAL/PLATELET - Abnormal; Notable for the following components:   WBC 14.6 (*)    RBC 3.39 (*)    Hemoglobin 7.6 (*)    HCT 27.5 (*)    MCH 22.4 (*)    MCHC 27.6 (*)    RDW 16.4 (*)    Platelets 837 (*)    Neutro Abs 10.9 (*)    Abs Immature Granulocytes 0.14 (*)    All other components within normal limits  CBG MONITORING, ED - Abnormal; Notable for the following components:   Glucose-Capillary 123 (*)    All other components within normal limits  POC OCCULT BLOOD, ED - Abnormal; Notable for the following components:   Fecal Occult Bld POSITIVE (*)    All other components within normal limits  SARS CORONAVIRUS 2 BY RT PCR (HOSPITAL ORDER, PERFORMED IN [facility])  CULTURE, BLOOD (ROUTINE X 2)  CULTURE, BLOOD (ROUTINE X 2)  URINE CULTURE  LACTIC ACID, PLASMA  PROTIME-INR  APTT  URINALYSIS, ROUTINE W REFLEX MICROSCOPIC  LACTIC ACID, PLASMA  LACTIC ACID, PLASMA  TYPE AND SCREEN  ABO/RH  PREPARE RBC (CROSSMATCH)    EKG EKG Interpretation  Date/Time:  Saturday October 07 2019 17:36:28 EDT Ventricular Rate:  66 PR Interval:    QRS Duration: 90 QT Interval:  443 QTC Calculation: 465 R Axis:   39 Text Interpretation: Sinus rhythm No significant change since  last tracing Confirmed by Alvira MondaySchlossman, Libbie Bartley ([id number]) on 10/07/2019 8:25:29 PM   Radiology CT ABDOMEN PELVIS WO CONTRAST  Result Date: 10/07/2019 CLINICAL DATA:  Possible intra-abdominal abscess. Sacral wound. Sepsis. EXAM: CT ABDOMEN AND PELVIS WITHOUT CONTRAST TECHNIQUE: Multidetector CT imaging of the abdomen and pelvis was performed following the standard protocol without IV contrast. COMPARISON:  06/04/2018 FINDINGS: Lower chest: Lung bases demonstrate small bilateral pleural effusions. Mild bibasilar dependent atelectasis. Calcification of the mitral valve annulus. Hepatobiliary: Previous cholecystectomy. Liver and biliary tree are normal. Pancreas: Normal. Spleen: Normal. Adrenals/Urinary Tract: Adrenal glands are normal. Kidneys are normal in size without hydronephrosis. No focal mass or nephrolithiasis. Ureters and bladder are unremarkable. Stomach/Bowel: Stomach and small bowel are normal. Previous appendectomy. Mild wall thickening over the rectum which may be due to proctitis. Colon is otherwise unremarkable. Vascular/Lymphatic: Calcified plaque over the abdominal aorta which is normal in caliber. No evidence of adenopathy. Reproductive: Previous hysterectomy. Other: No free fluid or focal inflammatory change within the abdomen. Musculoskeletal: Evidence of patient's known midline sacral decubitus ulcer. Mottled air collection within the subcutaneous fat right of midline just deep to the ulceration measuring 1.6 x 5 cm possibly developing subcutaneous abscess. Area of ulceration extends up to the superficial surface of the sacrum. Suggestion of minimal  bone destruction of the adjacent right side of the sacrum likely representing osteomyelitis. Degenerative change of the spine with posterior fusion hardware intact from L4-S1. IMPRESSION: 1. Evidence of patient's known midline sacral decubitus ulcer. Mottled air collection within the subcutaneous fat right of midline just deep to the ulceration measuring  1.6 x 5 cm possibly developing subcutaneous abscess. Suggestion of minimal bone destruction of the adjacent right side of the sacrum likely representing osteomyelitis. 2. Circumferential wall thickening over the rectum which may be due to acute proctitis. 3. Small bilateral pleural effusions with mild bibasilar atelectasis. 4. Aortic atherosclerosis. Aortic Atherosclerosis (ICD10-I70.0). Electronically Signed   By: Elberta Fortis M.D.   On: 10/07/2019 19:53   DG Chest Port 1 View  Result Date: 10/07/2019 CLINICAL DATA:  Questionable sepsis.  Evaluate for pneumonia. EXAM: PORTABLE CHEST 1 VIEW COMPARISON:  06/05/2018 FINDINGS: Patient rotated left. The Chin overlies the left apex. Prior median sternotomy. Cardiomegaly accentuated by AP portable technique. No pleural effusion or pneumothorax. Diffuse peribronchial thickening. Vague increased density projecting over the left lung base, favored to be due to overlying soft tissues. Clear right lung. IMPRESSION: 1. No acute cardiopulmonary disease. 2. Peribronchial thickening which may relate to chronic bronchitis or smoking. 3. Likely artifactual density projecting over the left lung base on this oblique portable radiograph. If left lower lobe pneumonia is a concern, consider PA and lateral radiographs. Electronically Signed   By: Jeronimo Greaves M.D.   On: 10/07/2019 16:44    Procedures .Critical Care Performed by: Alvira Monday, MD Authorized by: Alvira Monday, MD   Critical care provider statement:    Critical care time (minutes):  110   Critical care was time spent personally by me on the following activities:  Discussions with consultants, evaluation of patient's response to treatment, examination of patient, ordering and performing treatments and interventions, ordering and review of laboratory studies, ordering and review of radiographic studies, pulse oximetry, re-evaluation of patient's condition, obtaining history from patient or surrogate and  review of old charts   (including critical care time)  Medications Ordered in ED Medications  lactated ringers infusion ( Intravenous New Bag/Given 10/07/19 1844)  pantoprazole (PROTONIX) 80 mg in sodium chloride 0.9 % 100 mL IVPB (has no administration in time range)  pantoprazole (PROTONIX) 80 mg in sodium chloride 0.9 % 100 mL (0.8 mg/mL) infusion (has no administration in time range)  pantoprazole (PROTONIX) injection 40 mg (has no administration in time range)  0.9 %  sodium chloride infusion (Manually program via Guardrails IV Fluids) (has no administration in time range)  piperacillin-tazobactam (ZOSYN) IVPB 3.375 g (has no administration in time range)  vancomycin (VANCOREADY) IVPB 750 mg/150 mL (has no administration in time range)  lactated ringers bolus 1,000 mL (0 mLs Intravenous Stopped 10/07/19 1751)    And  lactated ringers bolus 1,000 mL (0 mLs Intravenous Stopped 10/07/19 1833)    And  lactated ringers bolus 250 mL (0 mLs Intravenous Stopped 10/07/19 1953)  piperacillin-tazobactam (ZOSYN) IVPB 3.375 g (0 g Intravenous Stopped 10/07/19 1710)  vancomycin (VANCOREADY) IVPB 1500 mg/300 mL (0 mg Intravenous Stopped 10/07/19 1845)    ED Course  I have reviewed the triage vital signs and the nursing notes.  Pertinent labs & imaging results that were available during my care of the patient were reviewed by me and considered in my medical decision making (see chart for details).    MDM Rules/Calculators/A&P  79yo female with history of atrial fibrillation on aspirin, admission to [facility] for UTI/colitiis/COVID sepsis in June, dementia, hypertension, CAD presents with concern for sacral ulcer after daughter had seen it while checking on her mother at [facility] facility.  Patient presents with blood pressures in the 80s.  Code sepsis was initiated with 30/kg lactated Ringer's, vancomycin and Zosyn for sepsis secondary to sacral wound infection  suspected.  For her sacral wound, discussed with Dr. Derrell Lolling of general surgery.  He came to bedside to evaluate the patient.  At this time, recommend hydrotherapy and they will continue to follow for possible surgical debridement pending course.  Labs significant for anemia with a hemoglobin of 7.6, decreased from 9 previously at time of discharge from [facility] in June.  History regarding this is limited.  Facility denies any black or bloody stools, however daughter notes stool did appear dark yesterday when she saw her being changed.  She does have melanotic stool on exam that is Hemoccult positive.  Overall, I suspect the most likely etiology of her hypotension is sepsis secondary to her sacral wound infection, however, given clinical picture with anemia and evidence of bleeding on exam will transfuse 1 U pRBC. Discussed this with daughter, Dewayne Hatch.  Consulted Dr. Chancy Milroy of [facility] who will evaluate her.  Consulted Dr. Mikeal Hawthorne for admission. Blood pressures improved with IV fluids, abx.     Final Clinical Impression(s) / ED Diagnoses Final diagnoses:  Sepsis without acute organ dysfunction, due to unspecified organism (HCC)  Anemia, unspecified type  Melena  Sacral wound, initial encounter    Rx / DC Orders ED Discharge Orders    None       Alvira Monday, MD 10/07/19 2027

## 2019-10-07 NOTE — ED Triage Notes (Signed)
Pt to triage via [facility] from [facility].  Per EMS pt sent to ED for sacral wound on buttock.  Pt denies pain.  Alert to person.

## 2019-10-07 NOTE — Sepsis Progress Note (Signed)
Lactic rising. Serial lactic acid ordered to be collected. Vital signs stable. Bundle complete. Admitted to Progressive floor in stable condition.

## 2019-10-07 NOTE — Consult Note (Signed)
Reason for Consult: Sacral decubitus ulcer Referring Physician: Dr. [name] is an 79 y.o. female.  HPI: Patient is a 79 year old female, poor historian who comes in secondary to a sacral decub.  History was obtained via the chart.  Patient was hospitalized at [facility] with UTI/Covid.  Patient was hospitalized for approximately 15 days.  Thereafter patient was placed at [facility], and then went to [facility] rehab patient has a small bedsore at the time of arrival.  According to the patient's daughter patient was found to have a larger sacral decub today.  Patient daughter then requested ED evaluation.  There was also a note that the patient did have darker stools.  It appeared the patient had a large decubitus ulcer with purulence.  Patient also with leukocytosis and lactic acidosis.  General surgery was consulted for evaluation of sacral decub.  Past Medical History:  Diagnosis Date  . Anemia, iron deficiency   . Anemia, vitamin B12 deficiency   . BACK PAIN, CHRONIC   . CAD (coronary artery disease)    S/P CABG in 1995. Last cath in 2003 demonstrated left main 20% stenosis, LAD 50-70% mid stenosis, circumflex 40-50% stenosis in the AV groove, 30% right coronary artery stenosis. Saphenous vein graft to the marginal was patent. A LIMA to the LAD was patent.  Marland Kitchen CAROTID ARTERY STENOSIS   . Diabetes mellitus    Diet controlled since weight loss  . GERD (gastroesophageal reflux disease)   . Hypothyroidism   . Mitral valve disorders(424.0)   . Obstructive sleep apnea on CPAP    intol of CPAP - noncompliant  . Osteopenia   . Pancreatitis   . Pseudoaneurysm [facility])    Requiring repair following last cath  . Tobacco abuse    Quit 02/2010    Past Surgical History:  Procedure Laterality Date  . ABDOMINAL HYSTERECTOMY    . APPENDECTOMY    . BREAST LUMPECTOMY    . CARDIAC CATHETERIZATION  2003  . CORONARY ARTERY BYPASS GRAFT  1995   [place], [place]   . LUMBAR FUSION    . SAPHENOUS VEIN GRAFT RESECTION  2003  . TONSILLECTOMY      Family History  Problem Relation Age of Onset  . Hypertension Father   . Heart disease Mother   . Stroke Mother   . Heart disease Brother   . Stroke Maternal Grandfather   . Hypertension Other   . Hyperlipidemia Other   . Stroke Other     Social History:  reports that she quit smoking about 9 years ago. Her smoking use included cigarettes. She smoked 1.00 pack per day. She has never used smokeless tobacco. She reports that she does not drink alcohol and does not use drugs.  Allergies:  Allergies  Allergen Reactions  . Morphine And Related Other (See Comments) and Hives    "makes me mean as a snake"   . Nitrofurantoin Other (See Comments)    Ruptured tendin  Ruptured tendin   . Trazodone And Nefazodone Rash  . Contrast Media [Iodinated Diagnostic Agents] Other (See Comments)    AFIB  . Morphine     agression  . Ciprocin-Fluocin-Procin  [Fluocinolone] Rash  . Ciprofloxacin Rash    Medications: I have reviewed the patient's current medications.  Results for orders placed or performed during the hospital encounter of 10/07/19 (from the past 48 hour(s))  CBG monitoring, ED     Status: Abnormal   Collection Time: 10/07/19  2:41 PM  Result Value Ref Range   Glucose-Capillary 123 (H) 70 - 99 mg/dL    Comment: Glucose reference range applies only to samples taken after fasting for at least 8 hours.  Lactic acid, plasma     Status: None   Collection Time: 10/07/19  3:06 PM  Result Value Ref Range   Lactic Acid, Venous 1.2 0.5 - 1.9 mmol/L    Comment: Performed at [facility], [address], [place], [place] [address]  Comprehensive metabolic panel     Status: Abnormal   Collection Time: 10/07/19  3:07 PM  Result Value Ref Range   Sodium 139 135 - 145 mmol/L   Potassium 3.7 3.5 - 5.1 mmol/L   Chloride 103 98 - 111 mmol/L   CO2 26 22 - 32 mmol/L   Glucose, Bld 122 (H) 70 - 99 mg/dL     Comment: Glucose reference range applies only to samples taken after fasting for at least 8 hours.   BUN 12 8 - 23 mg/dL   Creatinine, Ser 0.17 0.44 - 1.00 mg/dL   Calcium 8.9 8.9 - 51.0 mg/dL   Total Protein 5.9 (L) 6.5 - 8.1 g/dL   Albumin 2.0 (L) 3.5 - 5.0 g/dL   AST 18 15 - 41 U/L   ALT 32 0 - 44 U/L   Alkaline Phosphatase 85 38 - 126 U/L   Total Bilirubin 0.4 0.3 - 1.2 mg/dL   GFR calc non Af Amer >60 >60 mL/min   GFR calc Af Amer >60 >60 mL/min   Anion gap 10 5 - 15    Comment: Performed at [facility], [address] [place], [place] [address]  CBC with Differential     Status: Abnormal   Collection Time: 10/07/19  3:07 PM  Result Value Ref Range   WBC 14.6 (H) 4.0 - 10.5 K/uL   RBC 3.39 (L) 3.87 - 5.11 MIL/uL   Hemoglobin 7.6 (L) 12.0 - 15.0 g/dL   HCT 77.8 (L) 36 - 46 %   MCV 81.1 80.0 - 100.0 fL   MCH 22.4 (L) 26.0 - 34.0 pg   MCHC 27.6 (L) 30.0 - 36.0 g/dL   RDW 24.2 (H) 35.3 - 61.4 %   Platelets 837 (H) 150 - 400 K/uL   nRBC 0.0 0.0 - 0.2 %   Neutrophils Relative % 75 %   Neutro Abs 10.9 (H) 1.7 - 7.7 K/uL   Lymphocytes Relative 17 %   Lymphs Abs 2.4 0.7 - 4.0 K/uL   Monocytes Relative 5 %   Monocytes Absolute 0.8 0 - 1 K/uL   Eosinophils Relative 2 %   Eosinophils Absolute 0.3 0 - 0 K/uL   Basophils Relative 0 %   Basophils Absolute 0.1 0 - 0 K/uL   Immature Granulocytes 1 %   Abs Immature Granulocytes 0.14 (H) 0.00 - 0.07 K/uL    Comment: Performed at [facility], [address] [place], [place] [address]  Type and screen [facility]     Status: None   Collection Time: 10/07/19  4:19 PM  Result Value Ref Range   ABO/RH(D) A POS    Antibody Screen NEG    Sample Expiration 10/10/2019,2359    Unit Number [id number]    Blood Component Type RED CELLS,LR    Unit division 00    Status of Unit      REL FROM [facility] Performed at [facility], [address] [place], [place] [address]    Transfusion Status PENDING     Crossmatch Result PENDING   POC occult blood, ED     Status: Abnormal   Collection Time: 10/07/19  4:26 PM  Result Value Ref Range   Fecal Occult Bld POSITIVE (A) NEGATIVE  SARS Coronavirus 2 by RT PCR (hospital order, performed in [facility] hospital lab) Nasopharyngeal Nasopharyngeal Swab     Status: None   Collection Time: 10/07/19  4:31 PM   Specimen: Nasopharyngeal Swab  Result Value Ref Range   SARS Coronavirus 2 NEGATIVE NEGATIVE    Comment: (NOTE) SARS-CoV-2 target nucleic acids are NOT DETECTED.  The SARS-CoV-2 RNA is generally detectable in upper and lower respiratory specimens during the acute phase of infection. The lowest concentration of SARS-CoV-2 viral copies this assay can detect is 250 copies / mL. A negative result does not preclude SARS-CoV-2 infection and should not be used as the sole basis for treatment or other patient management decisions.  A negative result may occur with improper specimen collection / handling, submission of specimen other than nasopharyngeal swab, presence of viral mutation(s) within the areas targeted by this assay, and inadequate number of viral copies (<250 copies / mL). A negative result must be combined with clinical observations, patient history, and epidemiological information.  Fact Sheet for Patients:   BoilerBrush.com.cyhttps://www.fda.gov/media/136312/download  Fact Sheet for Healthcare Providers: https://pope.com/https://www.fda.gov/media/136313/download  This test is not yet approved or  cleared by the [place] FDA and has been authorized for detection and/or diagnosis of SARS-CoV-2 by FDA under an Emergency Use Authorization (EUA).  This EUA will remain in effect (meaning this test can be used) for the duration of the COVID-19 declaration under Section 564(b)(1) of the Act, 21 U.S.C. section 360bbb-3(b)(1), unless the authorization is terminated or revoked sooner.  Performed at [facility], [address] [place], [place] [address]    Lactic acid, plasma     Status: Abnormal   Collection Time: 10/07/19  5:00 PM  Result Value Ref Range   Lactic Acid, Venous 2.5 (HH) 0.5 - 1.9 mmol/L    Comment: CRITICAL RESULT CALLED TO, READ BACK BY AND VERIFIED WITH: C.Payton EmeraldBAIN RN @ 1752 10/07/2019 BY C.EDENS Performed at [facility], [address] [place], [place] [address]   Protime-INR     Status: None   Collection Time: 10/07/19  5:00 PM  Result Value Ref Range   Prothrombin Time 13.7 11.4 - 15.2 seconds   INR 1.1 0.8 - 1.2    Comment: (NOTE) INR goal varies based on device and disease states. Performed at [facility], [address] [place], [place] [address]   APTT     Status: None   Collection Time: 10/07/19  5:00 PM  Result Value Ref Range   aPTT 36 24 - 36 seconds    Comment: Performed at [facility], [address] [place], [place] [address]  Prepare RBC (crossmatch)     Status: None   Collection Time: 10/07/19  5:50 PM  Result Value Ref Range   Order Confirmation      ORDER PROCESSED BY BLOOD BANK Performed at [facility], [address] [place], [place] [address]     DG Chest Port 1 View  Result Date: 10/07/2019 CLINICAL DATA:  Questionable sepsis.  Evaluate for pneumonia. EXAM: PORTABLE CHEST 1 VIEW COMPARISON:  06/05/2018 FINDINGS: Patient rotated left. The Chin overlies the left apex. Prior median sternotomy.  Cardiomegaly accentuated by AP portable technique. No pleural effusion or pneumothorax. Diffuse peribronchial thickening. Vague increased density projecting over the left lung base, favored to be due to overlying soft tissues. Clear right lung. IMPRESSION: 1. No acute cardiopulmonary disease. 2. Peribronchial thickening which may relate to chronic bronchitis or smoking. 3. Likely artifactual density projecting over the left lung base on this oblique portable radiograph. If left lower lobe pneumonia is a concern, consider PA and lateral radiographs. Electronically Signed   By:  Jeronimo Greaves M.D.   On: 10/07/2019 16:44    Review of Systems  Unable to perform ROS: Dementia   Blood pressure (!) 118/50, pulse 60, temperature 99.6 F (37.6 C), temperature source Rectal, resp. rate 14, SpO2 98 %. Physical Exam Constitutional:      General: She is not in acute distress.    Appearance: She is well-developed. She is not ill-appearing.     Comments: Conversant No acute distress  HENT:     Head: Normocephalic.  Eyes:     General: Lids are normal. No scleral icterus.    Comments: Pupils are equal round and reactive No lid lag Moist conjunctiva  Neck:     Thyroid: No thyromegaly.     Trachea: No tracheal tenderness.     Comments: No cervical lymphadenopathy Cardiovascular:     Rate and Rhythm: Regular rhythm.     Heart sounds: No murmur heard.   Pulmonary:     Effort: Pulmonary effort is normal.     Breath sounds: Normal breath sounds. No wheezing or rales.  Abdominal:     General: Abdomen is flat. There is no distension.     Tenderness: There is no abdominal tenderness.     Hernia: No hernia is present.  Skin:    General: Skin is warm.     Findings: No rash.     Nails: There is no clubbing.          Comments: Sacral decub with purulence  Neurological:     Mental Status: She is alert. She is disoriented.     Comments: Normal gait and station  Psychiatric:        Judgment: Judgment normal.     Comments: Appropriate affect     Assessment/Plan: 79 year old female with lengthy medical history, with what appears to be an infected sacral decubitus ulcer.  Patient Active Problem List   Diagnosis Date Noted  . Coronary artery disease involving native coronary artery of native heart without angina pectoris 07/13/2018  . Essential hypertension 07/13/2018  . Dyslipidemia 07/13/2018  . Edema 07/13/2018  . Educated about COVID-19 virus infection 07/13/2018  . Hyperlipidemia 08/06/2010  . DYSPHAGIA UNSPECIFIED 04/03/2010  . ANEMIA-IRON DEFICIENCY  10/07/2009  . GERD 10/07/2009  . KIDNEY DISEASE 10/07/2009  . Chronic back pain 10/07/2009  . TOBACCO ABUSE 02/27/2009  . MITRAL VALVE DISORDERS 02/27/2009  . CAROTID ARTERY STENOSIS 02/27/2009  . HYPOTHYROIDISM 12/31/2008  . T2DM (type 2 diabetes mellitus) (HCC) 12/31/2008  . CAD (coronary artery disease) 12/31/2008  . PANCREATITIS 12/31/2008  . SLEEP APNEA 12/31/2008    Plan:  At this time would recommend medical admission and IV antibiotics.  Patient undergo CT scan without contrast as she is allergic.  I believe starting hydrotherapy may clean up the wound.  Ideally this may be all that is needed.  If after some hydrotherapy it appears she does have some necrotic base and/or edges this may require surgical debridement.  I did discuss the plan with Dr. Dalene Seltzer.  We will follow along.  Axel Filler 10/07/2019, 6:36 PM

## 2019-10-08 DIAGNOSIS — R195 Other fecal abnormalities: Secondary | ICD-10-CM

## 2019-10-08 DIAGNOSIS — E119 Type 2 diabetes mellitus without complications: Secondary | ICD-10-CM

## 2019-10-08 DIAGNOSIS — K921 Melena: Secondary | ICD-10-CM

## 2019-10-08 DIAGNOSIS — S31000A Unspecified open wound of lower back and pelvis without penetration into retroperitoneum, initial encounter: Secondary | ICD-10-CM

## 2019-10-08 DIAGNOSIS — D649 Anemia, unspecified: Secondary | ICD-10-CM

## 2019-10-08 DIAGNOSIS — L89154 Pressure ulcer of sacral region, stage 4: Secondary | ICD-10-CM | POA: Diagnosis present

## 2019-10-08 LAB — COMPREHENSIVE METABOLIC PANEL
ALT: 31 U/L (ref 0–44)
AST: 20 U/L (ref 15–41)
Albumin: 2 g/dL — ABNORMAL LOW (ref 3.5–5.0)
Alkaline Phosphatase: 82 U/L (ref 38–126)
Anion gap: 9 (ref 5–15)
BUN: 11 mg/dL (ref 8–23)
CO2: 26 mmol/L (ref 22–32)
Calcium: 8.7 mg/dL — ABNORMAL LOW (ref 8.9–10.3)
Chloride: 103 mmol/L (ref 98–111)
Creatinine, Ser: 0.54 mg/dL (ref 0.44–1.00)
GFR calc Af Amer: >60 mL/min (ref >60)
GFR calc non Af Amer: >60 mL/min (ref >60)
Glucose, Bld: 105 mg/dL — ABNORMAL HIGH (ref 70–99)
Potassium: 4.3 mmol/L (ref 3.5–5.1)
Sodium: 138 mmol/L (ref 135–145)
Total Bilirubin: 0.5 mg/dL (ref 0.3–1.2)
Total Protein: 5.7 g/dL — ABNORMAL LOW (ref 6.5–8.1)

## 2019-10-08 LAB — CBC
HCT: 26.4 % — ABNORMAL LOW (ref 36.0–46.0)
Hemoglobin: 7.5 g/dL — ABNORMAL LOW (ref 12.0–15.0)
MCH: 22.7 pg — ABNORMAL LOW (ref 26.0–34.0)
MCHC: 28.4 g/dL — ABNORMAL LOW (ref 30.0–36.0)
MCV: 80 fL (ref 80.0–100.0)
Platelets: 763 10*3/uL — ABNORMAL HIGH (ref 150–400)
RBC: 3.3 MIL/uL — ABNORMAL LOW (ref 3.87–5.11)
RDW: 16.5 % — ABNORMAL HIGH (ref 11.5–15.5)
WBC: 20.6 10*3/uL — ABNORMAL HIGH (ref 4.0–10.5)
nRBC: 0 % (ref 0.0–0.2)

## 2019-10-08 LAB — GLUCOSE, CAPILLARY
Glucose-Capillary: 90 mg/dL (ref 70–99)
Glucose-Capillary: 91 mg/dL (ref 70–99)
Glucose-Capillary: 84 mg/dL (ref 70–99)

## 2019-10-08 LAB — PROCALCITONIN: Procalcitonin: <0.1 ng/mL

## 2019-10-08 LAB — PROTIME-INR
INR: 1.1 (ref 0.8–1.2)
Prothrombin Time: 13.4 seconds (ref 11.4–15.2)

## 2019-10-08 LAB — CORTISOL-AM, BLOOD: Cortisol - AM: 14.7 ug/dL (ref 6.7–22.6)

## 2019-10-08 MED ORDER — SODIUM CHLORIDE 0.9 % IV SOLN
80.0000 mg | Freq: Once | INTRAVENOUS | Status: AC
  Administered 2019-10-08: 02:00:00 80 mg via INTRAVENOUS
  Filled 2019-10-08: qty 80

## 2019-10-08 NOTE — Plan of Care (Signed)
POC initiated and progressing. 

## 2019-10-08 NOTE — Consult Note (Signed)
WOC Nurse Consult Note: Reason for Consult: WOC Nursing consulted by Palliative Care NP for wound care for Pressure Injury to sacrum. Simultaneously consulted with Surgery ([facility]) and Dr. Derrell Lolling has already seen today.  Hydrotherapy was recommended. Surgical intervention will be considered after trial of hydrotherapy according to his note from today. WOC Nursing will defer to Surgery for POC and further evaluation. Wound type:Pressure Pressure Injury POA: Yes  WOC nursing team will not follow, but will remain available to this patient, the nursing and medical teams.  Please re-consult if needed. Thanks, Ladona Mow, MSN, RN, GNP, [place]  Pager# [phone]

## 2019-10-08 NOTE — Evaluation (Signed)
Clinical/Bedside Swallow Evaluation Patient Details  Name: Amber Bean MRN: [id number] Date of Birth: 08/26/1940  Today's Date: 10/08/2019 Time: SLP Start Time (ACUTE ONLY): 1159 SLP Stop Time (ACUTE ONLY): 1213 SLP Time Calculation (min) (ACUTE ONLY): 14 min  Past Medical History:  Past Medical History:  Diagnosis Date  . Anemia, iron deficiency   . Anemia, vitamin B12 deficiency   . BACK PAIN, CHRONIC   . CAD (coronary artery disease)    S/P CABG in 1995. Last cath in 2003 demonstrated left main 20% stenosis, LAD 50-70% mid stenosis, circumflex 40-50% stenosis in the AV groove, 30% right coronary artery stenosis. Saphenous vein graft to the marginal was patent. A LIMA to the LAD was patent.  Marland Kitchen CAROTID ARTERY STENOSIS   . Diabetes mellitus    Diet controlled since weight loss  . GERD (gastroesophageal reflux disease)   . Hypothyroidism   . Mitral valve disorders(424.0)   . Obstructive sleep apnea on CPAP    intol of CPAP - noncompliant  . Osteopenia   . Pancreatitis   . Pseudoaneurysm [facility])    Requiring repair following last cath  . Tobacco abuse    Quit 02/2010   Past Surgical History:  Past Surgical History:  Procedure Laterality Date  . ABDOMINAL HYSTERECTOMY    . APPENDECTOMY    . BREAST LUMPECTOMY    . CARDIAC CATHETERIZATION  2003  . CORONARY ARTERY BYPASS GRAFT  1995   [place], [place]  . LUMBAR FUSION    . SAPHENOUS VEIN GRAFT RESECTION  2003  . TONSILLECTOMY     HPI:  Pt is a 79 yo female with recent hospitalization [facility]) in June for UTI/colitis/COVID. She is admitted from SNF with sepsis from sacral ulcer. GI also consulted for possible GIB. MBS was completed at [facility] in June 2021 revealing cord level penetration with thin/nectar thick liquids. FEES repeated several days later showed improvements including adequate pharyngeal function but reduced oral control and coordination. Dys 2 (chopped) diet and thin liquids recommended. PMH includes: dementia, DM,  previous tobacco use, anemia, CAD, HTN, GERD   Assessment / Plan / Recommendation Clinical Impression  Pt's safety for PO intake is limited by her mentation and positioning. She is a little more alert and interactive for me than she has been throughout the morning, keeping her eyes opening and saying a few single words (mostly "yes" or "okay"). She does demonstrate some automaticity with swallowing, consistently eliciting a swallow response with thin liquids and purees. There are no overt s/s of aspiration, but she does have a mild increase in RR that occurs after each sip of thin liquid. Given her overall mentation I would not start a PO diet today, but if she can maintain this LOA, would be okay to try meds crushed in puree or small sips of water after oral care. SLP will f/u for potential to resume PO diet.   SLP Visit Diagnosis: Dysphagia, unspecified (R13.10)    Aspiration Risk  Moderate aspiration risk    Diet Recommendation NPO except meds;Free water protocol after oral care   Medication Administration: Crushed with puree    Other  Recommendations Oral Care Recommendations: Oral care QID Other Recommendations: Have oral suction available   Follow up Recommendations Skilled Nursing facility      Frequency and Duration min 2x/week  2 weeks       Prognosis Prognosis for Safe Diet Advancement: Good Barriers to Reach Goals: Cognitive deficits      Swallow Study  General HPI: Pt is a 79 yo female female with recent hospitalization [facility]) in June for UTI/colitis/COVID. She is admitted from SNF with sepsis from sacral ulcer. GI also consulted for possible GIB. MBS was completed at [facility] in June 2021 revealing cord level penetration with thin/nectar thick liquids. FEES repeated several days later showed improvements including adequate pharyngeal function but reduced oral control and coordination. Dys 2 (chopped) diet and thin liquids recommended. PMH includes: dementia, DM, previous tobacco  use, anemia, CAD, HTN, GERD Type of Study: Bedside Swallow Evaluation Previous Swallow Assessment: see HPI Diet Prior to this Study: NPO Temperature Spikes Noted: No Respiratory Status: Room air History of Recent Intubation: No Behavior/Cognition: Alert;Cooperative;Requires cueing Oral Cavity Assessment: Within Functional Limits Oral Care Completed by SLP: No Oral Cavity - Dentition: Dentures, top;Dentures, bottom Self-Feeding Abilities: Total assist Patient Positioning: Upright in bed (reverse trendelenburg) Baseline Vocal Quality: Normal Volitional Swallow: Unable to elicit    Oral/Motor/Sensory Function Overall Oral Motor/Sensory Function: Generalized oral weakness   Ice Chips Ice chips: Within functional limits Presentation: Spoon   Thin Liquid Thin Liquid: Within functional limits Presentation: Straw    Nectar Thick Nectar Thick Liquid: Not tested   Honey Thick Honey Thick Liquid: Not tested   Puree Puree: Within functional limits Presentation: Spoon   Solid     Solid: Not tested      Mahala Menghini., M.A. CCC-SLP Acute Rehabilitation Services Pager [phone] Office [phone]  10/08/2019,12:31 PM

## 2019-10-08 NOTE — H&P (View-Only) (Signed)
 Referring Provider:  [facility]         Primary Care Physician:  Sanders, Kirk S, MD Primary Gastroenterologist:  Carl Gessner, MD (remote)             We were asked to see this patient for:  melena                 ASSESSMENT /  PLAN    CC:   Dark stools   Amber Bean is a 78 y.o. female PMH significant for, but not necessarily limited to, CAD with remote CABG, Afib on ASA, dementia, HTN, COVID, diabetes, hypothyroidism, appendectomy                                                                                                                                    # Anemia, borderline microcytic / Heme + stools on daily ASA.  --Stools dark at nursing facility but it seems she was getting oral iron.   --Suspect iron deficiency.Ferritin was 37 in June which is low considering she had UTI, COVID, colitis at the time.   --Hgb 9 in late June, down to 7.6 this admission. Got a unit of blood this am, follow up hgb not done / resulted --CBC today to see if responded to blood transfusion --Continue IV PPI --Will put on tentatively for EGD tomorrow. Again her stools may be dark from oral iron but daughter not sure if she was receiving it at the facility. However, she is FOBT+ and had a decline in hgb. The risks and benefits of EGD were discussed and the patient and daughter who will sign the consent. Daughter plans to talk to her sister about EGD but doesn't anticipate any issues      HPI:      Amber Bean is a 78 y.o. female admitted with a sacral ulcer. Patient has dementia so history comes chart, Care EveryWhere and from daughter in room. She was admitted to [facility] in June with COVID, sepsis, AFib with RVR, Reviewed records in Care Everywhere. She was admitted to ICU, given pressors. A non-contrast CT scan done for evaluation of abdominal pain and septic shock was remarkable for descending colon colitis.  C-diff was negative. I didn't find results of any other stool  studies. Sepsis felt to be secondary to colitis, possibly UTI and COVID. She was treated with 5 days of Zosyn followed by 5 days of cipro / flagyl. Patient was discharged to a Nursing Facility. She was brought to [facility] yesterday for sacral ulcer. Daughter had noticed stools being dark. Hgb had declined from 9 in late June to 7.6.  Iron studies in June show ferritin of 37, TIBC 187 with 5% sat).   Patient has hx of GERD and takes daily protonix. Outside med list pertinent also for a daily full ASA as well as baby ASA. Patient denies abdominal pain, only has pain in "my   tail".  .  PREVIOUS ENDOSCOPIC EVALUATIONS / GI STUDIES :  08/03/19 Non-contrast CT scan at [facility]  --remarkable for descending colon colitis.   10/07/19 Non-contrast CT scan --sacral decubitus --circumferential wall thickening over rectum maybe due to acute proctitis  Past Medical History:  Diagnosis Date  . Anemia, iron deficiency   . Anemia, vitamin B12 deficiency   . BACK PAIN, CHRONIC   . CAD (coronary artery disease)    S/P CABG in 1995. Last cath in 2003 demonstrated left main 20% stenosis, LAD 50-70% mid stenosis, circumflex 40-50% stenosis in the AV groove, 30% right coronary artery stenosis. Saphenous vein graft to the marginal was patent. A LIMA to the LAD was patent.  . CAROTID ARTERY STENOSIS   . Diabetes mellitus    Diet controlled since weight loss  . GERD (gastroesophageal reflux disease)   . Hypothyroidism   . Mitral valve disorders(424.0)   . Obstructive sleep apnea on CPAP    intol of CPAP - noncompliant  . Osteopenia   . Pancreatitis   . Pseudoaneurysm (HCC)    Requiring repair following last cath  . Tobacco abuse    Quit 02/2010    Past Surgical History:  Procedure Laterality Date  . ABDOMINAL HYSTERECTOMY    . APPENDECTOMY    . BREAST LUMPECTOMY    . CARDIAC CATHETERIZATION  2003  . CORONARY ARTERY BYPASS GRAFT  1995   [place], [place]  . LUMBAR FUSION    . SAPHENOUS VEIN GRAFT  RESECTION  2003  . TONSILLECTOMY      Prior to Admission medications   Medication Sig Start Date End Date Taking? Authorizing Provider  acetaminophen (TYLENOL) 325 MG tablet Take 650 mg by mouth every 6 (six) hours as needed for mild pain.   Yes [provider]  amitriptyline (ELAVIL) 150 MG tablet Take 150 mg by mouth at bedtime.   Yes [provider]  ascorbic acid (VITAMIN C) 500 MG tablet Take 500 mg by mouth daily.   Yes [provider]  aspirin 325 MG EC tablet Take 325 mg by mouth daily.   Yes [provider]  atenolol (TENORMIN) 50 MG tablet TAKE 1.5 TABLETS (75 MG TOTAL) BY MOUTH DAILY. KEEP OV. Patient taking differently: Take 50 mg by mouth daily.  06/28/17  Yes Hochrein, James, MD  collagenase (SANTYL) ointment Apply 1 application topically daily.   Yes [provider]  diltiazem (CARDIZEM CD) 120 MG 24 hr capsule Take 120 mg by mouth daily.   Yes [provider]  donepezil (ARICEPT) 5 MG tablet Take 5 mg by mouth at bedtime.   Yes [provider]  ferrous gluconate (FERGON) 324 MG tablet Take 324 mg by mouth 3 (three) times daily with meals.   Yes [provider]  gabapentin (NEURONTIN) 100 MG capsule Take 100 mg by mouth 3 (three) times daily.   Yes [provider]  gabapentin (NEURONTIN) 100 MG capsule Take 100 mg by mouth 3 (three) times daily.   Yes [provider]  levothyroxine (SYNTHROID) 25 MCG tablet Take 25 mcg by mouth daily before breakfast.   Yes [provider]  lisinopril (ZESTRIL) 5 MG tablet Take 5 mg by mouth daily.   Yes [provider]  nitroGLYCERIN (NITROSTAT) 0.4 MG SL tablet Place 1 tablet (0.4 mg total) under the tongue as needed. 07/13/18  Yes Hochrein, James, MD  pantoprazole (PROTONIX) 20 MG tablet Take 1 tablet (20 mg total) by mouth daily. 04/29/17 10/07/19   Yes Long, Joshua G, MD  risperiDONE (RISPERDAL) 0.5 MG tablet Take 0.5 mg by mouth 2 (two) times  daily.   Yes [provider]  sertraline (ZOLOFT) 100 MG tablet Take 100 mg by mouth daily.  04/11/18  Yes [provider]  sodium hypochlorite (DAKIN'S 1/4 STRENGTH) 0.125 % SOLN Irrigate with 1 application as directed daily.   Yes [provider]  thiamine 100 MG tablet Take 100 mg by mouth daily.   Yes [provider]  zinc gluconate 50 MG tablet Take 50 mg by mouth daily.   Yes [provider]  ALPRAZolam (XANAX) 0.5 MG tablet Take 1 tablet by mouth 2 (two) times daily as needed for anxiety. Patient not taking: Reported on 10/07/2019 04/09/17   [provider]  amitriptyline (ELAVIL) 75 MG tablet Take 75 mg by mouth every evening. Patient not taking: Reported on 10/07/2019 04/08/18   [provider]  aspirin 81 MG tablet Take 81 mg by mouth daily as needed for mild pain.  Patient not taking: Reported on 10/07/2019    [provider]  baclofen (LIORESAL) 10 MG tablet TAKE 1 TABLET BY MOUTH THREE TIMES DAILY. Patient not taking: No sig reported 06/09/16   Bradshaw, Samuel L, MD  bumetanide (BUMEX) 1 MG tablet TAKE (1) TABLET BY MOUTH TWICE A DAY IF NEEDED. Patient not taking: Reported on 10/07/2019 07/13/18   Hochrein, James, MD  dicyclomine (BENTYL) 10 MG capsule Take 10 mg by mouth daily as needed for spasms.  Patient not taking: Reported on 10/07/2019 01/12/13   [provider]  levothyroxine (SYNTHROID, LEVOTHROID) 112 MCG tablet TAKE (1) TABLET BY MOUTH DAILY Patient not taking: Reported on 10/07/2019 11/12/16   Bradshaw, Samuel L, MD  lisinopril (PRINIVIL,ZESTRIL) 10 MG tablet Take 1 tablet (10 mg total) by mouth daily. Patient not taking: Reported on 10/07/2019 11/12/16   Bradshaw, Samuel L, MD  meclizine (ANTIVERT) 25 MG tablet Take 25 mg by mouth daily as needed for dizziness or nausea.  Patient not taking: Reported on 10/07/2019    [provider]  methocarbamol (ROBAXIN) 750 MG tablet Take 750 mg by mouth 3  (three) times daily as needed for pain. Patient not taking: Reported on 10/07/2019 04/29/18   [provider]  Oxycodone HCl 10 MG TABS Take 10 mg by mouth 4 (four) times daily. Patient not taking: Reported on 10/07/2019 02/14/18   [provider]  potassium chloride SA (K-DUR,KLOR-CON) 20 MEQ tablet TAKE ONE TABLET BY MOUTH TWICE DAILY Patient not taking: Reported on 10/07/2019 09/10/15   Bradshaw, Samuel L, MD  risperiDONE (RISPERDAL) 1 MG tablet Take 1 mg by mouth 3 (three) times daily. Patient not taking: Reported on 10/07/2019    [provider]  rOPINIRole (REQUIP) 1 MG tablet TAKE ONE (1) TABLET AT BEDTIME Patient not taking: Reported on 07/13/2018 09/10/15   Bradshaw, Samuel L, MD    Current Facility-Administered Medications  Medication Dose Route Frequency Provider Last Rate Last Admin  . acetaminophen (TYLENOL) tablet 650 mg  650 mg Oral Q6H PRN Garba, Mohammad L, MD      . amitriptyline (ELAVIL) tablet 150 mg  150 mg Oral QHS Garba, Mohammad L, MD   150 mg at 10/07/19 2331  . ascorbic acid (VITAMIN C) tablet 500 mg  500 mg Oral Daily Garba, Mohammad L, MD      . atenolol (TENORMIN) tablet 50 mg  50 mg Oral Daily Garba, Mohammad L, MD      .   cefTRIAXone (ROCEPHIN) 2 g in sodium chloride 0.9 % 100 mL IVPB  2 g Intravenous Q24H Garba, Mohammad L, MD 200 mL/hr at 10/07/19 2358 2 g at 10/07/19 2358  . diltiazem (CARDIZEM CD) 24 hr capsule 120 mg  120 mg Oral Daily Garba, Mohammad L, MD      . donepezil (ARICEPT) tablet 5 mg  5 mg Oral QHS Garba, Mohammad L, MD   5 mg at 10/07/19 2331  . ferrous gluconate (FERGON) tablet 324 mg  324 mg Oral TID WC Garba, Mohammad L, MD      . gabapentin (NEURONTIN) capsule 100 mg  100 mg Oral TID Garba, Mohammad L, MD   100 mg at 10/07/19 2336  . ketorolac (TORADOL) 15 MG/ML injection 15 mg  15 mg Intravenous Q6H PRN Garba, Mohammad L, MD      . lactated ringers infusion   Intravenous Continuous Garba, Mohammad L, MD 100 mL/hr at  10/08/19 1009 New Bag at 10/08/19 1009  . levothyroxine (SYNTHROID) tablet 25 mcg  25 mcg Oral QAC breakfast Garba, Mohammad L, MD   25 mcg at 10/08/19 0512  . lisinopril (ZESTRIL) tablet 5 mg  5 mg Oral Daily Garba, Mohammad L, MD      . nitroGLYCERIN (NITROSTAT) SL tablet 0.4 mg  0.4 mg Sublingual PRN Garba, Mohammad L, MD      . ondansetron (ZOFRAN) tablet 4 mg  4 mg Oral Q6H PRN Garba, Mohammad L, MD       Or  . ondansetron (ZOFRAN) injection 4 mg  4 mg Intravenous Q6H PRN Garba, Mohammad L, MD      . pantoprazole (PROTONIX) 80 mg in sodium chloride 0.9 % 100 mL (0.8 mg/mL) infusion  8 mg/hr Intravenous Continuous Schlossman, Erin, MD 10 mL/hr at 10/08/19 1134 8 mg/hr at 10/08/19 1134  . [START ON 10/11/2019] pantoprazole (PROTONIX) injection 40 mg  40 mg Intravenous Q12H Schlossman, Erin, MD      . sertraline (ZOLOFT) tablet 100 mg  100 mg Oral Daily Garba, Mohammad L, MD      . thiamine tablet 100 mg  100 mg Oral Daily Garba, Mohammad L, MD      . vancomycin (VANCOREADY) IVPB 750 mg/150 mL  750 mg Intravenous Q12H Eakins, Jason, RPH 150 mL/hr at 10/08/19 0512 750 mg at 10/08/19 0512  . zinc sulfate capsule 220 mg  220 mg Oral Daily Garba, Mohammad L, MD        Allergies as of 10/07/2019 - Review Complete 10/07/2019  Allergen Reaction Noted  . Morphine and related Other (See Comments) and Hives 06/23/2012  . Nitrofurantoin Other (See Comments) 06/23/2012  . Trazodone and nefazodone Rash 11/07/2015  . Contrast media [iodinated diagnostic agents] Other (See Comments) 09/09/2011  . Morphine    . Ciprocin-fluocin-procin  [fluocinolone] Rash 06/04/2018  . Ciprofloxacin Rash     Family History  Problem Relation Age of Onset  . Hypertension Father   . Heart disease Mother   . Stroke Mother   . Heart disease Brother   . Stroke Maternal Grandfather   . Hypertension Other   . Hyperlipidemia Other   . Stroke Other     Social History   Socioeconomic History  . Marital status: Widowed     Spouse name: Not on file  . Number of children: Not on file  . Years of education: Not on file  . Highest education level: Not on file  Occupational History  . Not on file  Tobacco Use  .   Smoking status: Former Smoker    Packs/day: 1.00    Types: Cigarettes    Quit date: 01/23/2010    Years since quitting: 9.7  . Smokeless tobacco: Never Used  . Tobacco comment: Quit smoking years ago then resumed smoking in 2009  Substance and Sexual Activity  . Alcohol use: No  . Drug use: No  . Sexual activity: Not on file  Other Topics Concern  . Not on file  Social History Narrative   Lives in [place]   Widowed 03/2009   Now lives alone   No regular exercise   Social Determinants of Health   Financial Resource Strain:   . Difficulty of Paying Living Expenses:   Food Insecurity:   . Worried About Running Out of Food in the Last Year:   . Ran Out of Food in the Last Year:   Transportation Needs:   . Lack of Transportation (Medical):   . Lack of Transportation (Non-Medical):   Physical Activity:   . Days of Exercise per Week:   . Minutes of Exercise per Session:   Stress:   . Feeling of Stress :   Social Connections:   . Frequency of Communication with Friends and Family:   . Frequency of Social Gatherings with Friends and Family:   . Attends Religious Services:   . Active Member of Clubs or Organizations:   . Attends Club or Organization Meetings:   . Marital Status:   Intimate Partner Violence:   . Fear of Current or Ex-Partner:   . Emotionally Abused:   . Physically Abused:   . Sexually Abused:     Review of Systems: All systems reviewed and negative except where noted in HPI.  Physical Exam: Vital signs in last 24 hours: Temp:  [97.5 F (36.4 C)-99.6 F (37.6 C)] 98.2 F (36.8 C) (08/15 1103) Pulse Rate:  [55-78] 67 (08/15 1103) Resp:  [12-27] 18 (08/15 1103) BP: (89-141)/(40-85) 125/59 (08/15 1103) SpO2:  [94 %-100 %] 98 % (08/15 1103) Weight:  [68 kg] 68 kg  (08/14 2100) Last BM Date: 10/07/19 General:   Alert, well-developed, female in NAD Psych:   cooperative.  Eyes:  Pupils equal, sclera clear, no icterus.   Conjunctiva pink. Ears:  Normal auditory acuity. Nose:  No deformity, discharge,  or lesions. Neck:  Supple; no masses Lungs:  Clear throughout to auscultation.   No wheezes, crackles, or rhonchi.  Heart:  Regular rate, ! + BLE edema Abdomen:  Soft, non-distended, nontender, BS active, no palp mass   Rectal:  Deferred  Msk:  Symmetrical without gross deformities. . Neurologic:  Alert, answers some questions. Some blank looking stares at times.   Skin:  Intact without significant lesions or rashes.   Intake/Output from previous day: 08/14 0701 - 08/15 0700 In: 2440.5 [I.V.:1885.5; Blood:355; IV Piggyback:200] Out: -  Intake/Output this shift: Total I/O In: -  Out: 850 [Urine:850]  Lab Results: Recent Labs    10/07/19 1507 10/07/19 2157 10/08/19 0036  WBC 14.6* 20.3* 20.6*  HGB 7.6* 7.6* 7.5*  HCT 27.5* 27.1* 26.4*  PLT 837* 775* 763*   BMET Recent Labs    10/07/19 1507 10/07/19 2157 10/08/19 0036  NA 139  --  138  K 3.7  --  4.3  CL 103  --  103  CO2 26  --  26  GLUCOSE 122*  --  105*  BUN 12  --  11  CREATININE 0.60 0.45 0.54  CALCIUM 8.9  --  8.7*     LFT Recent Labs    10/08/19 0036  PROT 5.7*  ALBUMIN 2.0*  AST 20  ALT 31  ALKPHOS 82  BILITOT 0.5   PT/INR Recent Labs    10/07/19 1700 10/08/19 0036  LABPROT 13.7 13.4  INR 1.1 1.1   Hepatitis Panel No results for input(s): HEPBSAG, HCVAB, HEPAIGM, HEPBIGM in the last 72 hours.   . CBC Latest Ref Rng & Units 10/08/2019 10/07/2019 10/07/2019  WBC 4.0 - 10.5 K/uL 20.6(H) 20.3(H) 14.6(H)  Hemoglobin 12.0 - 15.0 g/dL 7.5(L) 7.6(L) 7.6(L)  Hematocrit 36 - 46 % 26.4(L) 27.1(L) 27.5(L)  Platelets 150 - 400 K/uL 763(H) 775(H) 837(H)    . CMP Latest Ref Rng & Units 10/08/2019 10/07/2019 10/07/2019  Glucose 70 - 99 mg/dL 105(H) - 122(H)  BUN 8 -  23 mg/dL 11 - 12  Creatinine 0.44 - 1.00 mg/dL 0.54 0.45 0.60  Sodium 135 - 145 mmol/L 138 - 139  Potassium 3.5 - 5.1 mmol/L 4.3 - 3.7  Chloride 98 - 111 mmol/L 103 - 103  CO2 22 - 32 mmol/L 26 - 26  Calcium 8.9 - 10.3 mg/dL 8.7(L) - 8.9  Total Protein 6.5 - 8.1 g/dL 5.7(L) - 5.9(L)  Total Bilirubin 0.3 - 1.2 mg/dL 0.5 - 0.4  Alkaline Phos 38 - 126 U/L 82 - 85  AST 15 - 41 U/L 20 - 18  ALT 0 - 44 U/L 31 - 32   Studies/Results: CT ABDOMEN PELVIS WO CONTRAST  Result Date: 10/07/2019 CLINICAL DATA:  Possible intra-abdominal abscess. Sacral wound. Sepsis. EXAM: CT ABDOMEN AND PELVIS WITHOUT CONTRAST TECHNIQUE: Multidetector CT imaging of the abdomen and pelvis was performed following the standard protocol without IV contrast. COMPARISON:  06/04/2018 FINDINGS: Lower chest: Lung bases demonstrate small bilateral pleural effusions. Mild bibasilar dependent atelectasis. Calcification of the mitral valve annulus. Hepatobiliary: Previous cholecystectomy. Liver and biliary tree are normal. Pancreas: Normal. Spleen: Normal. Adrenals/Urinary Tract: Adrenal glands are normal. Kidneys are normal in size without hydronephrosis. No focal mass or nephrolithiasis. Ureters and bladder are unremarkable. Stomach/Bowel: Stomach and small bowel are normal. Previous appendectomy. Mild wall thickening over the rectum which may be due to proctitis. Colon is otherwise unremarkable. Vascular/Lymphatic: Calcified plaque over the abdominal aorta which is normal in caliber. No evidence of adenopathy. Reproductive: Previous hysterectomy. Other: No free fluid or focal inflammatory change within the abdomen. Musculoskeletal: Evidence of patient's known midline sacral decubitus ulcer. Mottled air collection within the subcutaneous fat right of midline just deep to the ulceration measuring 1.6 x 5 cm possibly developing subcutaneous abscess. Area of ulceration extends up to the superficial surface of the sacrum. Suggestion of minimal  bone destruction of the adjacent right side of the sacrum likely representing osteomyelitis. Degenerative change of the spine with posterior fusion hardware intact from L4-S1. IMPRESSION: 1. Evidence of patient's known midline sacral decubitus ulcer. Mottled air collection within the subcutaneous fat right of midline just deep to the ulceration measuring 1.6 x 5 cm possibly developing subcutaneous abscess. Suggestion of minimal bone destruction of the adjacent right side of the sacrum likely representing osteomyelitis. 2. Circumferential wall thickening over the rectum which may be due to acute proctitis. 3. Small bilateral pleural effusions with mild bibasilar atelectasis. 4. Aortic atherosclerosis. Aortic Atherosclerosis (ICD10-I70.0). Electronically Signed   By: Daniel  Boyle M.D.   On: 10/07/2019 19:53   DG Chest Port 1 View  Result Date: 10/07/2019 CLINICAL DATA:  Questionable sepsis.  Evaluate for pneumonia. EXAM: PORTABLE CHEST 1 VIEW COMPARISON:  06/05/2018   FINDINGS: Patient rotated left. The Chin overlies the left apex. Prior median sternotomy. Cardiomegaly accentuated by AP portable technique. No pleural effusion or pneumothorax. Diffuse peribronchial thickening. Vague increased density projecting over the left lung base, favored to be due to overlying soft tissues. Clear right lung. IMPRESSION: 1. No acute cardiopulmonary disease. 2. Peribronchial thickening which may relate to chronic bronchitis or smoking. 3. Likely artifactual density projecting over the left lung base on this oblique portable radiograph. If left lower lobe pneumonia is a concern, consider PA and lateral radiographs. Electronically Signed   By: Kyle  Talbot M.D.   On: 10/07/2019 16:44    Principal Problem:   Sepsis (HCC) Active Problems:   Hypothyroidism   T2DM (type 2 diabetes mellitus) (HCC)   ANEMIA-IRON DEFICIENCY   CAD (coronary artery disease)   Mitral valve disease   Coronary artery disease involving native  coronary artery of native heart without angina pectoris   Essential hypertension   Dyslipidemia   Sacral decubitus ulcer, stage IV (HCC)    Virl Coble, NP-C @  10/08/2019, 2:29 PM     

## 2019-10-08 NOTE — Consult Note (Signed)
Referring Provider:  [facility]         Primary Care Physician:  Veverly Fells, MD Primary Gastroenterologist:  Stan Head, MD (remote)             We were asked to see this patient for:  melena                 ASSESSMENT /  PLAN    CC:   Dark stools   Amber Bean is a 79 y.o. female PMH significant for, but not necessarily limited to, CAD with remote CABG, Afib on ASA, dementia, HTN, COVID, diabetes, hypothyroidism, appendectomy                                                                                                                                    # Anemia, borderline microcytic / Heme + stools on daily ASA.  --Stools dark at nursing facility but it seems she was getting oral iron.   --Suspect iron deficiency.Ferritin was 37 in June which is low considering she had UTI, COVID, colitis at the time.   --Hgb 9 in late June, down to 7.6 this admission. Got a unit of blood this am, follow up hgb not done / resulted --CBC today to see if responded to blood transfusion --Continue IV PPI --Will put on tentatively for EGD tomorrow. Again her stools may be dark from oral iron but daughter not sure if she was receiving it at the facility. However, she is FOBT+ and had a decline in hgb. The risks and benefits of EGD were discussed and the patient and daughter who will sign the consent. Daughter plans to talk to her sister about EGD but doesn't anticipate any issues      HPI:      Amber Bean is a 79 y.o. female admitted with a sacral ulcer. Patient has dementia so history comes chart, Care EveryWhere and from daughter in room. She was admitted to [facility] in June with COVID, sepsis, AFib with RVR, Reviewed records in Care Everywhere. She was admitted to ICU, given pressors. A non-contrast CT scan done for evaluation of abdominal pain and septic shock was remarkable for descending colon colitis.  C-diff was negative. I didn't find results of any other stool  studies. Sepsis felt to be secondary to colitis, possibly UTI and COVID. She was treated with 5 days of Zosyn followed by 5 days of cipro / flagyl. Patient was discharged to a Nursing Facility. She was brought to [facility] yesterday for sacral ulcer. Daughter had noticed stools being dark. Hgb had declined from 9 in late June to 7.6.  Iron studies in June show ferritin of 37, TIBC 187 with 5% sat).   Patient has hx of GERD and takes daily protonix. Outside med list pertinent also for a daily full ASA as well as baby ASA. Patient denies abdominal pain, only has pain in "my  tail".  .  PREVIOUS ENDOSCOPIC EVALUATIONS / GI STUDIES :  08/03/19 Non-contrast CT scan at [facility]  --remarkable for descending colon colitis.   10/07/19 Non-contrast CT scan --sacral decubitus --circumferential wall thickening over rectum maybe due to acute proctitis  Past Medical History:  Diagnosis Date  . Anemia, iron deficiency   . Anemia, vitamin B12 deficiency   . BACK PAIN, CHRONIC   . CAD (coronary artery disease)    S/P CABG in 1995. Last cath in 2003 demonstrated left main 20% stenosis, LAD 50-70% mid stenosis, circumflex 40-50% stenosis in the AV groove, 30% right coronary artery stenosis. Saphenous vein graft to the marginal was patent. A LIMA to the LAD was patent.  Marland Kitchen CAROTID ARTERY STENOSIS   . Diabetes mellitus    Diet controlled since weight loss  . GERD (gastroesophageal reflux disease)   . Hypothyroidism   . Mitral valve disorders(424.0)   . Obstructive sleep apnea on CPAP    intol of CPAP - noncompliant  . Osteopenia   . Pancreatitis   . Pseudoaneurysm [facility])    Requiring repair following last cath  . Tobacco abuse    Quit 02/2010    Past Surgical History:  Procedure Laterality Date  . ABDOMINAL HYSTERECTOMY    . APPENDECTOMY    . BREAST LUMPECTOMY    . CARDIAC CATHETERIZATION  2003  . CORONARY ARTERY BYPASS GRAFT  1995   [place], [place]  . LUMBAR FUSION    . SAPHENOUS VEIN GRAFT  RESECTION  2003  . TONSILLECTOMY      Prior to Admission medications   Medication Sig Start Date End Date Taking? Authorizing Provider  acetaminophen (TYLENOL) 325 MG tablet Take 650 mg by mouth every 6 (six) hours as needed for mild pain.   Yes [provider]  amitriptyline (ELAVIL) 150 MG tablet Take 150 mg by mouth at bedtime.   Yes [provider]  ascorbic acid (VITAMIN C) 500 MG tablet Take 500 mg by mouth daily.   Yes [provider]  aspirin 325 MG EC tablet Take 325 mg by mouth daily.   Yes [provider]  atenolol (TENORMIN) 50 MG tablet TAKE 1.5 TABLETS (75 MG TOTAL) BY MOUTH DAILY. KEEP OV. Patient taking differently: Take 50 mg by mouth daily.  06/28/17  Yes Rollene Rotunda, MD  collagenase (SANTYL) ointment Apply 1 application topically daily.   Yes [provider]  diltiazem (CARDIZEM CD) 120 MG 24 hr capsule Take 120 mg by mouth daily.   Yes [provider]  donepezil (ARICEPT) 5 MG tablet Take 5 mg by mouth at bedtime.   Yes [provider]  ferrous gluconate (FERGON) 324 MG tablet Take 324 mg by mouth 3 (three) times daily with meals.   Yes [provider]  gabapentin (NEURONTIN) 100 MG capsule Take 100 mg by mouth 3 (three) times daily.   Yes [provider]  gabapentin (NEURONTIN) 100 MG capsule Take 100 mg by mouth 3 (three) times daily.   Yes [provider]  levothyroxine (SYNTHROID) 25 MCG tablet Take 25 mcg by mouth daily before breakfast.   Yes [provider]  lisinopril (ZESTRIL) 5 MG tablet Take 5 mg by mouth daily.   Yes [provider]  nitroGLYCERIN (NITROSTAT) 0.4 MG SL tablet Place 1 tablet (0.4 mg total) under the tongue as needed. 07/13/18  Yes Rollene Rotunda, MD  pantoprazole (PROTONIX) 20 MG tablet Take 1 tablet (20 mg total) by mouth daily. 04/29/17 10/07/19  Yes Long, Arlyss Repress, MD  risperiDONE (RISPERDAL) 0.5 MG tablet Take 0.5 mg by mouth 2 (two) times  daily.   Yes [provider]  sertraline (ZOLOFT) 100 MG tablet Take 100 mg by mouth daily.  04/11/18  Yes [provider]  sodium hypochlorite (DAKIN'S 1/4 STRENGTH) 0.125 % SOLN Irrigate with 1 application as directed daily.   Yes [provider]  thiamine 100 MG tablet Take 100 mg by mouth daily.   Yes [provider]  zinc gluconate 50 MG tablet Take 50 mg by mouth daily.   Yes [provider]  ALPRAZolam Prudy Feeler) 0.5 MG tablet Take 1 tablet by mouth 2 (two) times daily as needed for anxiety. Patient not taking: Reported on 10/07/2019 04/09/17   [provider]  amitriptyline (ELAVIL) 75 MG tablet Take 75 mg by mouth every evening. Patient not taking: Reported on 10/07/2019 04/08/18   [provider]  aspirin 81 MG tablet Take 81 mg by mouth daily as needed for mild pain.  Patient not taking: Reported on 10/07/2019    [provider]  baclofen (LIORESAL) 10 MG tablet TAKE 1 TABLET BY MOUTH THREE TIMES DAILY. Patient not taking: No sig reported 06/09/16   Elenora Gamma, MD  bumetanide (BUMEX) 1 MG tablet TAKE (1) TABLET BY MOUTH TWICE A DAY IF NEEDED. Patient not taking: Reported on 10/07/2019 07/13/18   Rollene Rotunda, MD  dicyclomine (BENTYL) 10 MG capsule Take 10 mg by mouth daily as needed for spasms.  Patient not taking: Reported on 10/07/2019 01/12/13   [provider]  levothyroxine (SYNTHROID, LEVOTHROID) 112 MCG tablet TAKE (1) TABLET BY MOUTH DAILY Patient not taking: Reported on 10/07/2019 11/12/16   Elenora Gamma, MD  lisinopril (PRINIVIL,ZESTRIL) 10 MG tablet Take 1 tablet (10 mg total) by mouth daily. Patient not taking: Reported on 10/07/2019 11/12/16   Elenora Gamma, MD  meclizine (ANTIVERT) 25 MG tablet Take 25 mg by mouth daily as needed for dizziness or nausea.  Patient not taking: Reported on 10/07/2019    [provider]  methocarbamol (ROBAXIN) 750 MG tablet Take 750 mg by mouth 3  (three) times daily as needed for pain. Patient not taking: Reported on 10/07/2019 04/29/18   [provider]  Oxycodone HCl 10 MG TABS Take 10 mg by mouth 4 (four) times daily. Patient not taking: Reported on 10/07/2019 02/14/18   [provider]  potassium chloride SA (K-DUR,KLOR-CON) 20 MEQ tablet TAKE ONE TABLET BY MOUTH TWICE DAILY Patient not taking: Reported on 10/07/2019 09/10/15   Elenora Gamma, MD  risperiDONE (RISPERDAL) 1 MG tablet Take 1 mg by mouth 3 (three) times daily. Patient not taking: Reported on 10/07/2019    [provider]  rOPINIRole (REQUIP) 1 MG tablet TAKE ONE (1) TABLET AT BEDTIME Patient not taking: Reported on 07/13/2018 09/10/15   Elenora Gamma, MD    Current Facility-Administered Medications  Medication Dose Route Frequency Provider Last Rate Last Admin  . acetaminophen (TYLENOL) tablet 650 mg  650 mg Oral Q6H PRN Earlie Lou L, MD      . amitriptyline (ELAVIL) tablet 150 mg  150 mg Oral QHS Earlie Lou L, MD   150 mg at 10/07/19 2331  . ascorbic acid (VITAMIN C) tablet 500 mg  500 mg Oral Daily Garba, Mohammad L, MD      . atenolol (TENORMIN) tablet 50 mg  50 mg Oral Daily Rometta Emery, MD      .  cefTRIAXone (ROCEPHIN) 2 g in sodium chloride 0.9 % 100 mL IVPB  2 g Intravenous Q24H Earlie Lou L, MD 200 mL/hr at 10/07/19 2358 2 g at 10/07/19 2358  . diltiazem (CARDIZEM CD) 24 hr capsule 120 mg  120 mg Oral Daily Garba, Mohammad L, MD      . donepezil (ARICEPT) tablet 5 mg  5 mg Oral QHS Rometta Emery, MD   5 mg at 10/07/19 2331  . ferrous gluconate (FERGON) tablet 324 mg  324 mg Oral TID WC Garba, Mohammad L, MD      . gabapentin (NEURONTIN) capsule 100 mg  100 mg Oral TID Rometta Emery, MD   100 mg at 10/07/19 2336  . ketorolac (TORADOL) 15 MG/ML injection 15 mg  15 mg Intravenous Q6H PRN Earlie Lou L, MD      . lactated ringers infusion   Intravenous Continuous Rometta Emery, MD 100 mL/hr at  10/08/19 1009 New Bag at 10/08/19 1009  . levothyroxine (SYNTHROID) tablet 25 mcg  25 mcg Oral QAC breakfast Rometta Emery, MD   25 mcg at 10/08/19 0512  . lisinopril (ZESTRIL) tablet 5 mg  5 mg Oral Daily Garba, Mohammad L, MD      . nitroGLYCERIN (NITROSTAT) SL tablet 0.4 mg  0.4 mg Sublingual PRN Earlie Lou L, MD      . ondansetron (ZOFRAN) tablet 4 mg  4 mg Oral Q6H PRN Rometta Emery, MD       Or  . ondansetron (ZOFRAN) injection 4 mg  4 mg Intravenous Q6H PRN Earlie Lou L, MD      . pantoprazole (PROTONIX) 80 mg in sodium chloride 0.9 % 100 mL (0.8 mg/mL) infusion  8 mg/hr Intravenous Continuous Alvira Monday, MD 10 mL/hr at 10/08/19 1134 8 mg/hr at 10/08/19 1134  . [START ON 10/11/2019] pantoprazole (PROTONIX) injection 40 mg  40 mg Intravenous Q12H Alvira Monday, MD      . sertraline (ZOLOFT) tablet 100 mg  100 mg Oral Daily Garba, Mohammad L, MD      . thiamine tablet 100 mg  100 mg Oral Daily Mikeal Hawthorne, Mohammad L, MD      . vancomycin (VANCOREADY) IVPB 750 mg/150 mL  750 mg Intravenous Q12H Joaquim Lai, RPH 150 mL/hr at 10/08/19 0512 750 mg at 10/08/19 0512  . zinc sulfate capsule 220 mg  220 mg Oral Daily Rometta Emery, MD        Allergies as of 10/07/2019 - Review Complete 10/07/2019  Allergen Reaction Noted  . Morphine and related Other (See Comments) and Hives 06/23/2012  . Nitrofurantoin Other (See Comments) 06/23/2012  . Trazodone and nefazodone Rash 11/07/2015  . Contrast media [iodinated diagnostic agents] Other (See Comments) 09/09/2011  . Morphine    . Ciprocin-fluocin-procin  [fluocinolone] Rash 06/04/2018  . Ciprofloxacin Rash     Family History  Problem Relation Age of Onset  . Hypertension Father   . Heart disease Mother   . Stroke Mother   . Heart disease Brother   . Stroke Maternal Grandfather   . Hypertension Other   . Hyperlipidemia Other   . Stroke Other     Social History   Socioeconomic History  . Marital status: Widowed     Spouse name: Not on file  . Number of children: Not on file  . Years of education: Not on file  . Highest education level: Not on file  Occupational History  . Not on file  Tobacco Use  .  Smoking status: Former Smoker    Packs/day: 1.00    Types: Cigarettes    Quit date: 01/23/2010    Years since quitting: 9.7  . Smokeless tobacco: Never Used  . Tobacco comment: Quit smoking years ago then resumed smoking in 2009  Substance and Sexual Activity  . Alcohol use: No  . Drug use: No  . Sexual activity: Not on file  Other Topics Concern  . Not on file  Social History Narrative   Lives in [place] 03/2009   Now lives alone   No regular exercise   Social Determinants of Health   Financial Resource Strain:   . Difficulty of Paying Living Expenses:   Food Insecurity:   . Worried About Programme researcher, broadcasting/film/video in the Last Year:   . Barista in the Last Year:   Transportation Needs:   . Freight forwarder (Medical):   Marland Kitchen Lack of Transportation (Non-Medical):   Physical Activity:   . Days of Exercise per Week:   . Minutes of Exercise per Session:   Stress:   . Feeling of Stress :   Social Connections:   . Frequency of Communication with Friends and Family:   . Frequency of Social Gatherings with Friends and Family:   . Attends Religious Services:   . Active Member of Clubs or Organizations:   . Attends Banker Meetings:   Marland Kitchen Marital Status:   Intimate Partner Violence:   . Fear of Current or Ex-Partner:   . Emotionally Abused:   Marland Kitchen Physically Abused:   . Sexually Abused:     Review of Systems: All systems reviewed and negative except where noted in HPI.  Physical Exam: Vital signs in last 24 hours: Temp:  [97.5 F (36.4 C)-99.6 F (37.6 C)] 98.2 F (36.8 C) (08/15 1103) Pulse Rate:  [55-78] 67 (08/15 1103) Resp:  [12-27] 18 (08/15 1103) BP: (89-141)/(40-85) 125/59 (08/15 1103) SpO2:  [94 %-100 %] 98 % (08/15 1103) Weight:  [68 kg] 68 kg  (08/14 2100) Last BM Date: 10/07/19 General:   Alert, well-developed, female in NAD Psych:   cooperative.  Eyes:  Pupils equal, sclera clear, no icterus.   Conjunctiva pink. Ears:  Normal auditory acuity. Nose:  No deformity, discharge,  or lesions. Neck:  Supple; no masses Lungs:  Clear throughout to auscultation.   No wheezes, crackles, or rhonchi.  Heart:  Regular rate, ! + BLE edema Abdomen:  Soft, non-distended, nontender, BS active, no palp mass   Rectal:  Deferred  Msk:  Symmetrical without gross deformities. . Neurologic:  Alert, answers some questions. Some blank looking stares at times.   Skin:  Intact without significant lesions or rashes.   Intake/Output from previous day: 08/14 0701 - 08/15 0700 In: 2440.5 [I.V.:1885.5; Blood:355; IV Piggyback:200] Out: -  Intake/Output this shift: Total I/O In: -  Out: 850 [Urine:850]  Lab Results: Recent Labs    10/07/19 1507 10/07/19 2157 10/08/19 0036  WBC 14.6* 20.3* 20.6*  HGB 7.6* 7.6* 7.5*  HCT 27.5* 27.1* 26.4*  PLT 837* 775* 763*   BMET Recent Labs    10/07/19 1507 10/07/19 2157 10/08/19 0036  NA 139  --  138  K 3.7  --  4.3  CL 103  --  103  CO2 26  --  26  GLUCOSE 122*  --  105*  BUN 12  --  11  CREATININE 0.60 0.45 0.54  CALCIUM 8.9  --  8.7*  LFT Recent Labs    10/08/19 0036  PROT 5.7*  ALBUMIN 2.0*  AST 20  ALT 31  ALKPHOS 82  BILITOT 0.5   PT/INR Recent Labs    10/07/19 1700 10/08/19 0036  LABPROT 13.7 13.4  INR 1.1 1.1   Hepatitis Panel No results for input(s): HEPBSAG, HCVAB, HEPAIGM, HEPBIGM in the last 72 hours.   . CBC Latest Ref Rng & Units 10/08/2019 10/07/2019 10/07/2019  WBC 4.0 - 10.5 K/uL 20.6(H) 20.3(H) 14.6(H)  Hemoglobin 12.0 - 15.0 g/dL 7.5(L) 7.6(L) 7.6(L)  Hematocrit 36 - 46 % 26.4(L) 27.1(L) 27.5(L)  Platelets 150 - 400 K/uL 763(H) 775(H) 837(H)    . CMP Latest Ref Rng & Units 10/08/2019 10/07/2019 10/07/2019  Glucose 70 - 99 mg/dL 161(W105(H) - 960(A122(H)  BUN 8 -  23 mg/dL 11 - 12  Creatinine 5.400.44 - 1.00 mg/dL 9.810.54 1.910.45 4.780.60  Sodium 135 - 145 mmol/L 138 - 139  Potassium 3.5 - 5.1 mmol/L 4.3 - 3.7  Chloride 98 - 111 mmol/L 103 - 103  CO2 22 - 32 mmol/L 26 - 26  Calcium 8.9 - 10.3 mg/dL 2.9(F8.7(L) - 8.9  Total Protein 6.5 - 8.1 g/dL 6.2(Z5.7(L) - 5.9(L)  Total Bilirubin 0.3 - 1.2 mg/dL 0.5 - 0.4  Alkaline Phos 38 - 126 U/L 82 - 85  AST 15 - 41 U/L 20 - 18  ALT 0 - 44 U/L 31 - 32   Studies/Results: CT ABDOMEN PELVIS WO CONTRAST  Result Date: 10/07/2019 CLINICAL DATA:  Possible intra-abdominal abscess. Sacral wound. Sepsis. EXAM: CT ABDOMEN AND PELVIS WITHOUT CONTRAST TECHNIQUE: Multidetector CT imaging of the abdomen and pelvis was performed following the standard protocol without IV contrast. COMPARISON:  06/04/2018 FINDINGS: Lower chest: Lung bases demonstrate small bilateral pleural effusions. Mild bibasilar dependent atelectasis. Calcification of the mitral valve annulus. Hepatobiliary: Previous cholecystectomy. Liver and biliary tree are normal. Pancreas: Normal. Spleen: Normal. Adrenals/Urinary Tract: Adrenal glands are normal. Kidneys are normal in size without hydronephrosis. No focal mass or nephrolithiasis. Ureters and bladder are unremarkable. Stomach/Bowel: Stomach and small bowel are normal. Previous appendectomy. Mild wall thickening over the rectum which may be due to proctitis. Colon is otherwise unremarkable. Vascular/Lymphatic: Calcified plaque over the abdominal aorta which is normal in caliber. No evidence of adenopathy. Reproductive: Previous hysterectomy. Other: No free fluid or focal inflammatory change within the abdomen. Musculoskeletal: Evidence of patient's known midline sacral decubitus ulcer. Mottled air collection within the subcutaneous fat right of midline just deep to the ulceration measuring 1.6 x 5 cm possibly developing subcutaneous abscess. Area of ulceration extends up to the superficial surface of the sacrum. Suggestion of minimal  bone destruction of the adjacent right side of the sacrum likely representing osteomyelitis. Degenerative change of the spine with posterior fusion hardware intact from L4-S1. IMPRESSION: 1. Evidence of patient's known midline sacral decubitus ulcer. Mottled air collection within the subcutaneous fat right of midline just deep to the ulceration measuring 1.6 x 5 cm possibly developing subcutaneous abscess. Suggestion of minimal bone destruction of the adjacent right side of the sacrum likely representing osteomyelitis. 2. Circumferential wall thickening over the rectum which may be due to acute proctitis. 3. Small bilateral pleural effusions with mild bibasilar atelectasis. 4. Aortic atherosclerosis. Aortic Atherosclerosis (ICD10-I70.0). Electronically Signed   By: Elberta Fortisaniel  Boyle M.D.   On: 10/07/2019 19:53   DG Chest Port 1 View  Result Date: 10/07/2019 CLINICAL DATA:  Questionable sepsis.  Evaluate for pneumonia. EXAM: PORTABLE CHEST 1 VIEW COMPARISON:  06/05/2018  FINDINGS: Patient rotated left. The Chin overlies the left apex. Prior median sternotomy. Cardiomegaly accentuated by AP portable technique. No pleural effusion or pneumothorax. Diffuse peribronchial thickening. Vague increased density projecting over the left lung base, favored to be due to overlying soft tissues. Clear right lung. IMPRESSION: 1. No acute cardiopulmonary disease. 2. Peribronchial thickening which may relate to chronic bronchitis or smoking. 3. Likely artifactual density projecting over the left lung base on this oblique portable radiograph. If left lower lobe pneumonia is a concern, consider PA and lateral radiographs. Electronically Signed   By: Jeronimo Greaves M.D.   On: 10/07/2019 16:44    Principal Problem:   Sepsis (HCC) Active Problems:   Hypothyroidism   T2DM (type 2 diabetes mellitus) (HCC)   ANEMIA-IRON DEFICIENCY   CAD (coronary artery disease)   Mitral valve disease   Coronary artery disease involving native  coronary artery of native heart without angina pectoris   Essential hypertension   Dyslipidemia   Sacral decubitus ulcer, stage IV (HCC)    Willette Cluster, NP-C @  10/08/2019, 2:29 PM

## 2019-10-08 NOTE — Consult Note (Signed)
Palliative Medicine Inpatient Consult Note  Reason for consult:  Goals of Care  HPI:  Per intake H&P --> Amber Bean is a 79 y.o. female with medical history significant of dementia, UTI, COVID-19 pneumonia in June, diabetes, previous tobacco abuse, iron deficiency anemia coronary artery disease, hypertension, sacral decubitus ulcer [facility]  Patient has been there for the time until the daughter went to visit today and found her in the bad shape.  Her mother was weak debilitated and deep sacral decubitus ulcer.  She noticed that they have been changing the dressing but also has been having darker stools with suspected GI bleed.  She is weak debilitated and worse this year last saw her.  She brought her to the ER where she was seen and evaluated.  Patient found to be septic by criteria with a deep sacral decubitus ulcer stage IV as well as guaiac positive stool and anemia.  Palliative care was asked to address goals of care in the setting of GIB and sacral wound.   Clinical Assessment/Goals of Care: I have reviewed medical records including EPIC notes, labs and imaging, received report from bedside RN, assessed the patient who was lying in bed pleasantly confused.    I met with Amber Bean (daughter)  to further discuss diagnosis prognosis, GOC, EOL wishes, disposition and options.   I introduced Palliative Medicine as specialized medical care for people living with serious illness. It focuses on providing relief from the symptoms and stress of a serious illness. The goal is to improve quality of life for both the patient and the family.  I asked Amber Bean to tell me about her mother. She shares that she is a resident of [place] though she has also lived in [place] and [place]. She was married twice though is since a widow. She had six children though only three remain living. 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 She  enjoys spending time with her children, drinking doctor pepper, and prayer. She is of the [facility] denomination.  Prior to hospitalization  Amber was living at a skilled nursing facility. She had been in and out of SNF's for the past three months following a urinary infection. Since then her daughter shares that she has become bed bound.   Per discussion with Amber Bean there is a great deal of frustration as Amber Bean feels that the nursing home stays have caused and now worsened her sacral wound. Amber Bean shares that she wishes she could have taken her mother home to have avoided all of this. Utilized reflective listening as a form of emotional support.   I asked Amber Bean at this moment in time what she thinks is important to her mother. Prior to my entering the room Amber Bean said that her mother stated, "I want to live" this gave her the encouragement needed to proceed with the thought of an EGD. Amber Bean requested that first she find out what the insights of the surgical team are regarding wound debridement.   A detailed discussion was had today regarding advanced directives, Amber Bean shares that her mother has written down she will be the decisions maker for her - Amber Bean discusses all decisions with her sisters.    Concepts specific to code status, artifical feeding and hydration, continued IV antibiotics and rehospitalization was had. I completed a MOST form today. The patient and family outlined their wishes for the following treatment decisions:  Cardiopulmonary Resuscitation: Do Not Attempt Resuscitation (DNR/No  CPR)  Medical Interventions: Limited Additional Interventions: Use medical treatment, IV fluids and cardiac monitoring as indicated, DO NOT USE intubation or mechanical ventilation. May consider use of less invasive airway support such as BiPAP or CPAP. Also provide comfort measures. Transfer to the hospital if indicated. Avoid intensive care.   Antibiotics: Antibiotics if indicated  IV Fluids: IV fluids if  indicated  Feeding Tube: No feeding tube   The difference between a aggressive medical intervention path  and a palliative comfort care path for this patient at this time was had. I shared that if things do not improve, Amber Bean could always consider hospice as an alterative for her mother. Amber Bean shares that she went through hospice and she was "not happy with their services." She stated that she doesn't want to "kill her mother." I reoriented the conversation that the role of hospice it not to kill anyone but to offer them relief from possible undue suffering in their final phases of life.   Discussed the importance of continued conversation with family and their  medical providers regarding overall plan of care and treatment options, ensuring decisions are within the context of the patients values and GOCs.  Provided "Hard Choices for [employer]" booklet.   Decision Maker: Amber Bean (daughter) [phone]  SUMMARY OF RECOMMENDATIONS   DNAR/DNI  MOST / DNR completed and placed on chart  Patients daughter, Amber Bean would like to speak to the surgical team to see what the options for debridement are, per note review they had recommended hydrotherapy. Have requested that they update patients daughter.   Plan for pursuit of EGD tomorrow per GI to identify source of anemia - Amber Bean is verifying the pursuit of this with her sister  Ongoing GOC conversations - Hospice was offered as an option if patient and family determine she would like to go home and have comprehensive symptom management  Code Status/Advance Care Planning: DNAR/DNI   Palliative Prophylaxis:   Oral Care, Turn Q2H, Delirium precuations  Additional Recommendations (Limitations, Scope, Preferences):  Treat what it treatable   Psycho-social/Spiritual:   Desire for further Chaplaincy support: Yes- [employer]  Additional Recommendations: Education on hospice   Prognosis: Poor in the setting of weight loss, immobility, and Stg IV  sacral ulcer. Would be appropriate for hospice.  Discharge Planning: Discharge plan not clear though per discussion with daughter she would like to take Amber home.   PPS: 30%    This conversation/these recommendations were discussed with patient primary care team, Dr. Karleen Hampshire  Time In: 1530 Time Out: 1650 Total Time: 80 Greater than 50%  of this time was spent counseling and coordinating care related to the above assessment and plan.  [place] Team Team Cell Phone: [phone] Please utilize secure chat with additional questions, if there is no response within 30 minutes please call the above phone number  Palliative Medicine Team providers are available by phone from 7am to 7pm daily and can be reached through the team cell phone.  Should this patient require assistance outside of these hours, please call the patient's attending physician.

## 2019-10-08 NOTE — Progress Notes (Signed)
PROGRESS NOTE    Amber ShyVirginia E Bean  ZOX:[id number] DOB: Jul 24, 1940 DOA: 10/07/2019 PCP: Veverly FellsSanders, Kirk S, MD    Chief Complaint  Patient presents with  . sacral wound    Brief Narrative:  79 year old lady with prior history of dementia, UTI, COVID-19 pneumonia infection in June 2021, diabetes, iron deficiency anemia, coronary artery disease, hypertension, sacral decubitus ulcer was brought in from SNF for worsening decubitus ulcer wound and generalized weakness and poor oral intake. CT of the abdomen and pelvis shows evidence of sacral decubitus ulcers with multiple air collection within the subcutaneous fat of midline just deep to the ulceration, suspected subcutaneous abscess.  There is circumferential wall thickening over the rectum probably proctitis.  Patient is being admitted for sepsis secondary to infected sacral decubitus ulcer.  Assessment & Plan:   Principal Problem:   Sepsis (HCC) Active Problems:   Hypothyroidism   T2DM (type 2 diabetes mellitus) (HCC)   ANEMIA-IRON DEFICIENCY   CAD (coronary artery disease)   Mitral valve disease   Coronary artery disease involving native coronary artery of native heart without angina pectoris   Essential hypertension   Dyslipidemia   Sacral decubitus ulcer, stage IV (HCC)  Sepsis secondary to infected sacral decubitus ulcer:  Improving , BP parameters have improved. Pt has persistent leukocytosis. Lactate normalized.  Pro calcitonin is 0.10.  She was started on broad-spectrum IV antibiotics, continue the same.  General surgery consulted for possible debridement of the sacral decubitus ulcer.  General surgery recommended hydrotherapy at this time.  Patient is currently afebrile but has persistent leukocytosis.    Questionable GI bleed heme positive stools/microcytic anemia Patient's daughter reports history of melanotic stools. Patient's baseline hemoglobin is around 9 dropped to a hemoglobin of 7.6 this afternoon. S/p 1 unit  of PRBC transfusion.  Continue with IV PPI.  GI consulted plan for EGD in the morning.    History of coronary artery disease No chest pain at this time.   Essential hypertension Blood pressure parameters have improved.    Type 2 diabetes mellitus Get hemoglobin A1c.  CBG (last 3)  Recent Labs    10/08/19 0905 10/08/19 1108 10/08/19 1614  GLUCAP 90 91 84   Resume SSI.   Hypothyroidism Continue with Synthroid 25 MCG daily.    Pressure injury present on admission. Pressure Injury 10/07/19 Coccyx Right;Left;Medial Unstageable - Full thickness tissue loss in which the base of the injury is covered by slough (yellow, tan, gray, green or brown) and/or eschar (tan, brown or black) in the wound bed. (Active)  10/07/19 2300  Location: Coccyx  Location Orientation: Right;Left;Medial  Staging: Unstageable - Full thickness tissue loss in which the base of the injury is covered by slough (yellow, tan, gray, green or brown) and/or eschar (tan, brown or black) in the wound bed.  Wound Description (Comments):   Present on Admission: Yes         DVT prophylaxis: SCD'S Code Status: DNR  Family Communication: family at bedside.  Disposition:   Status is: Inpatient  Remains inpatient appropriate because:Ongoing diagnostic testing needed not appropriate for outpatient work up, IV treatments appropriate due to intensity of illness or inability to take PO and Inpatient level of care appropriate due to severity of illness   Dispo: The patient is from: SNF              Anticipated d/c is to: pending.               Anticipated d/c date is:  2 days              Patient currently is not medically stable to d/c.       Consultants:   GASTROENTEROLOGY  General surgery.   Palliative care  Procedures: none.  Antimicrobials: (  Anti-infectives (From admission, onward)   Start     Dose/Rate Route Frequency Ordered Stop   10/08/19 0500  vancomycin (VANCOREADY) IVPB 750 mg/150  mL     Discontinue     750 mg 150 mL/hr over 60 Minutes Intravenous Every 12 hours 10/07/19 1952     10/07/19 2230  piperacillin-tazobactam (ZOSYN) IVPB 3.375 g  Status:  Discontinued        3.375 g 12.5 mL/hr over 240 Minutes Intravenous Every 8 hours 10/07/19 1952 10/07/19 2127   10/07/19 2200  cefTRIAXone (ROCEPHIN) 2 g in sodium chloride 0.9 % 100 mL IVPB     Discontinue     2 g 200 mL/hr over 30 Minutes Intravenous Every 24 hours 10/07/19 2037     10/07/19 2045  vancomycin (VANCOCIN) IVPB 1000 mg/200 mL premix  Status:  Discontinued        1,000 mg 200 mL/hr over 60 Minutes Intravenous  Once 10/07/19 2037 10/07/19 2055   10/07/19 1615  piperacillin-tazobactam (ZOSYN) IVPB 3.375 g        3.375 g 100 mL/hr over 30 Minutes Intravenous  Once 10/07/19 1610 10/07/19 1710   10/07/19 1615  vancomycin (VANCOREADY) IVPB 1500 mg/300 mL        1,500 mg 150 mL/hr over 120 Minutes Intravenous  Once 10/07/19 1610 10/07/19 1845       Subjective: Lethargic, opens eyes briefly ,and is sleeping.   Objective: Vitals:   10/08/19 0454 10/08/19 0907 10/08/19 1103 10/08/19 1600  BP: (!) 112/40 (!) 113/46 (!) 125/59 (!) 124/47  Pulse: 64 66 67   Resp: 12 20 18    Temp: 98.2 F (36.8 C) 98.6 F (37 C) 98.2 F (36.8 C)   TempSrc: Axillary Oral Oral   SpO2: 98% 96% 98%   Weight:      Height:        Intake/Output Summary (Last 24 hours) at 10/08/2019 1650 Last data filed at 10/08/2019 1506 Gross per 24 hour  Intake 3755.7 ml  Output 850 ml  Net 2905.7 ml   Filed Weights   10/07/19 2100  Weight: 68 kg    Examination:  General exam: calm , not in distress.  Respiratory system: Clear to auscultation. Respiratory effort normal. Cardiovascular system: S1 & S2 heard, RRR. No JVD.  Gastrointestinal system: Abdomen is nondistended, soft and nontender.Normal bowel sounds heard. Central nervous system:  Lethargic, not able to hold a conversation, not following commands.  Extremities:no  cyanosis . Skin: unstageable right coccygeal ulcer on the right.  Psychiatry: cannot be assessed.     Data Reviewed: I have personally reviewed following labs and imaging studies  CBC: Recent Labs  Lab 10/07/19 1507 10/07/19 2157 10/08/19 0036  WBC 14.6* 20.3* 20.6*  NEUTROABS 10.9*  --   --   HGB 7.6* 7.6* 7.5*  HCT 27.5* 27.1* 26.4*  MCV 81.1 81.6 80.0  PLT 837* 775* 763*    Basic Metabolic Panel: Recent Labs  Lab 10/07/19 1507 10/07/19 2157 10/08/19 0036  NA 139  --  138  K 3.7  --  4.3  CL 103  --  103  CO2 26  --  26  GLUCOSE 122*  --  105*  BUN 12  --  11  CREATININE 0.60 0.45 0.54  CALCIUM 8.9  --  8.7*    GFR: Estimated Creatinine Clearance: 57.3 mL/min (by C-G formula based on SCr of 0.54 mg/dL).  Liver Function Tests: Recent Labs  Lab 10/07/19 1507 10/08/19 0036  AST 18 20  ALT 32 31  ALKPHOS 85 82  BILITOT 0.4 0.5  PROT 5.9* 5.7*  ALBUMIN 2.0* 2.0*    CBG: Recent Labs  Lab 10/07/19 1441 10/08/19 0905 10/08/19 1108 10/08/19 1614  GLUCAP 123* 90 91 84     Recent Results (from the past 240 hour(s))  Blood Culture (routine x 2)     Status: None (Preliminary result)   Collection Time: 10/07/19  4:06 PM   Specimen: BLOOD RIGHT FOREARM  Result Value Ref Range Status   Specimen Description BLOOD RIGHT FOREARM  Final   Special Requests   Final    BOTTLES DRAWN AEROBIC AND ANAEROBIC Blood Culture results may not be optimal due to an inadequate volume of blood received in culture bottles   Culture   Final    NO GROWTH < 24 HOURS Performed at [facility], [address] [place], [place] [address]    Report Status PENDING  Incomplete  Blood Culture (routine x 2)     Status: None (Preliminary result)   Collection Time: 10/07/19  4:11 PM   Specimen: BLOOD RIGHT HAND  Result Value Ref Range Status   Specimen Description BLOOD RIGHT HAND  Final   Special Requests   Final    BOTTLES DRAWN AEROBIC AND ANAEROBIC Blood Culture adequate  volume   Culture   Final    NO GROWTH < 24 HOURS Performed at [facility], [address], [place], [place] [address]    Report Status PENDING  Incomplete  SARS Coronavirus 2 by RT PCR (hospital order, performed in [facility] hospital lab) Nasopharyngeal Nasopharyngeal Swab     Status: None   Collection Time: 10/07/19  4:31 PM   Specimen: Nasopharyngeal Swab  Result Value Ref Range Status   SARS Coronavirus 2 NEGATIVE NEGATIVE Final    Comment: (NOTE) SARS-CoV-2 target nucleic acids are NOT DETECTED.  The SARS-CoV-2 RNA is generally detectable in upper and lower respiratory specimens during the acute phase of infection. The lowest concentration of SARS-CoV-2 viral copies this assay can detect is 250 copies / mL. A negative result does not preclude SARS-CoV-2 infection and should not be used as the sole basis for treatment or other patient management decisions.  A negative result may occur with improper specimen collection / handling, submission of specimen other than nasopharyngeal swab, presence of viral mutation(s) within the areas targeted by this assay, and inadequate number of viral copies (<250 copies / mL). A negative result must be combined with clinical observations, patient history, and epidemiological information.  Fact Sheet for Patients:   BoilerBrush.com.cy  Fact Sheet for Healthcare Providers: https://pope.com/  This test is not yet approved or  cleared by the [place] FDA and has been authorized for detection and/or diagnosis of SARS-CoV-2 by FDA under an Emergency Use Authorization (EUA).  This EUA will remain in effect (meaning this test can be used) for the duration of the COVID-19 declaration under Section 564(b)(1) of the Act, 21 U.S.C. section 360bbb-3(b)(1), unless the authorization is terminated or revoked sooner.  Performed at [facility], [address], [place], [place] [address]           Radiology Studies: CT ABDOMEN PELVIS  WO CONTRAST  Result Date: 10/07/2019 CLINICAL DATA:  Possible intra-abdominal abscess. Sacral wound. Sepsis. EXAM: CT ABDOMEN AND PELVIS WITHOUT CONTRAST TECHNIQUE: Multidetector CT imaging of the abdomen and pelvis was performed following the standard protocol without IV contrast. COMPARISON:  06/04/2018 FINDINGS: Lower chest: Lung bases demonstrate small bilateral pleural effusions. Mild bibasilar dependent atelectasis. Calcification of the mitral valve annulus. Hepatobiliary: Previous cholecystectomy. Liver and biliary tree are normal. Pancreas: Normal. Spleen: Normal. Adrenals/Urinary Tract: Adrenal glands are normal. Kidneys are normal in size without hydronephrosis. No focal mass or nephrolithiasis. Ureters and bladder are unremarkable. Stomach/Bowel: Stomach and small bowel are normal. Previous appendectomy. Mild wall thickening over the rectum which may be due to proctitis. Colon is otherwise unremarkable. Vascular/Lymphatic: Calcified plaque over the abdominal aorta which is normal in caliber. No evidence of adenopathy. Reproductive: Previous hysterectomy. Other: No free fluid or focal inflammatory change within the abdomen. Musculoskeletal: Evidence of patient's known midline sacral decubitus ulcer. Mottled air collection within the subcutaneous fat right of midline just deep to the ulceration measuring 1.6 x 5 cm possibly developing subcutaneous abscess. Area of ulceration extends up to the superficial surface of the sacrum. Suggestion of minimal bone destruction of the adjacent right side of the sacrum likely representing osteomyelitis. Degenerative change of the spine with posterior fusion hardware intact from L4-S1. IMPRESSION: 1. Evidence of patient's known midline sacral decubitus ulcer. Mottled air collection within the subcutaneous fat right of midline just deep to the ulceration measuring 1.6 x 5 cm possibly developing subcutaneous abscess.  Suggestion of minimal bone destruction of the adjacent right side of the sacrum likely representing osteomyelitis. 2. Circumferential wall thickening over the rectum which may be due to acute proctitis. 3. Small bilateral pleural effusions with mild bibasilar atelectasis. 4. Aortic atherosclerosis. Aortic Atherosclerosis (ICD10-I70.0). Electronically Signed   By: Elberta Fortis M.D.   On: 10/07/2019 19:53   DG Chest Port 1 View  Result Date: 10/07/2019 CLINICAL DATA:  Questionable sepsis.  Evaluate for pneumonia. EXAM: PORTABLE CHEST 1 VIEW COMPARISON:  06/05/2018 FINDINGS: Patient rotated left. The Chin overlies the left apex. Prior median sternotomy. Cardiomegaly accentuated by AP portable technique. No pleural effusion or pneumothorax. Diffuse peribronchial thickening. Vague increased density projecting over the left lung base, favored to be due to overlying soft tissues. Clear right lung. IMPRESSION: 1. No acute cardiopulmonary disease. 2. Peribronchial thickening which may relate to chronic bronchitis or smoking. 3. Likely artifactual density projecting over the left lung base on this oblique portable radiograph. If left lower lobe pneumonia is a concern, consider PA and lateral radiographs. Electronically Signed   By: Jeronimo Greaves M.D.   On: 10/07/2019 16:44        Scheduled Meds: . amitriptyline  150 mg Oral QHS  . ascorbic acid  500 mg Oral Daily  . atenolol  50 mg Oral Daily  . diltiazem  120 mg Oral Daily  . donepezil  5 mg Oral QHS  . ferrous gluconate  324 mg Oral TID WC  . gabapentin  100 mg Oral TID  . levothyroxine  25 mcg Oral QAC breakfast  . lisinopril  5 mg Oral Daily  . [START ON 10/11/2019] pantoprazole  40 mg Intravenous Q12H  . sertraline  100 mg Oral Daily  . thiamine  100 mg Oral Daily  . zinc sulfate  220 mg Oral Daily   Continuous Infusions: . cefTRIAXone (ROCEPHIN)  IV 2 g (10/07/19 2358)  . lactated ringers 100 mL/hr at 10/08/19 1009  . pantoprozole (  PROTONIX)  infusion 8 mg/hr (10/08/19 1134)  . vancomycin 750 mg (10/08/19 0512)     LOS: 1 day        Kathlen Mody, MD [facility]   To contact the attending provider between 7A-7P or the covering provider during after hours 7P-7A, please log into the web site [email or web address] and access using universal [place] password for that web site. If you do not have the password, please call the hospital operator.  10/08/2019, 4:50 PM

## 2019-10-09 ENCOUNTER — Encounter (HOSPITAL_COMMUNITY): Payer: Self-pay | Admitting: Internal Medicine

## 2019-10-09 ENCOUNTER — Inpatient Hospital Stay (HOSPITAL_COMMUNITY): Payer: Medicare Other | Admitting: Certified Registered"

## 2019-10-09 ENCOUNTER — Encounter (HOSPITAL_COMMUNITY)
Admission: EM | Disposition: A | Discharge status: Hospice | Payer: Self-pay | Source: Skilled Nursing Facility | Attending: Internal Medicine

## 2019-10-09 DIAGNOSIS — Z515 Encounter for palliative care: Secondary | ICD-10-CM

## 2019-10-09 DIAGNOSIS — Z66 Do not resuscitate: Secondary | ICD-10-CM

## 2019-10-09 DIAGNOSIS — K31819 Angiodysplasia of stomach and duodenum without bleeding: Secondary | ICD-10-CM

## 2019-10-09 DIAGNOSIS — Z7189 Other specified counseling: Secondary | ICD-10-CM

## 2019-10-09 HISTORY — PX: HOT HEMOSTASIS: SHX5433

## 2019-10-09 HISTORY — PX: HEMOSTASIS CLIP PLACEMENT: SHX6857

## 2019-10-09 HISTORY — PX: ESOPHAGOGASTRODUODENOSCOPY: SHX5428

## 2019-10-09 HISTORY — PX: BIOPSY: SHX5522

## 2019-10-09 LAB — COMPREHENSIVE METABOLIC PANEL
ALT: 22 U/L (ref 0–44)
AST: 16 U/L (ref 15–41)
Albumin: 1.8 g/dL — ABNORMAL LOW (ref 3.5–5.0)
Alkaline Phosphatase: 74 U/L (ref 38–126)
Anion gap: 11 (ref 5–15)
BUN: 8 mg/dL (ref 8–23)
CO2: 25 mmol/L (ref 22–32)
Calcium: 8.6 mg/dL — ABNORMAL LOW (ref 8.9–10.3)
Chloride: 104 mmol/L (ref 98–111)
Creatinine, Ser: 0.45 mg/dL (ref 0.44–1.00)
GFR calc Af Amer: >60 mL/min (ref >60)
GFR calc non Af Amer: >60 mL/min (ref >60)
Glucose, Bld: 83 mg/dL (ref 70–99)
Potassium: 3.1 mmol/L — ABNORMAL LOW (ref 3.5–5.1)
Sodium: 140 mmol/L (ref 135–145)
Total Bilirubin: 0.5 mg/dL (ref 0.3–1.2)
Total Protein: 5 g/dL — ABNORMAL LOW (ref 6.5–8.1)

## 2019-10-09 LAB — TYPE AND SCREEN
ABO/RH(D): A POS
Antibody Screen: NEGATIVE
Unit division: 0

## 2019-10-09 LAB — CBC WITH DIFFERENTIAL/PLATELET
Abs Immature Granulocytes: 0.07 10*3/uL (ref 0.00–0.07)
Basophils Absolute: 0.1 10*3/uL (ref 0.0–0.1)
Basophils Relative: 1 %
Eosinophils Absolute: 0.3 10*3/uL (ref 0.0–0.5)
Eosinophils Relative: 3 %
HCT: 26.2 % — ABNORMAL LOW (ref 36.0–46.0)
Hemoglobin: 7.9 g/dL — ABNORMAL LOW (ref 12.0–15.0)
Immature Granulocytes: 1 %
Lymphocytes Relative: 23 %
Lymphs Abs: 2.7 10*3/uL (ref 0.7–4.0)
MCH: 24.7 pg — ABNORMAL LOW (ref 26.0–34.0)
MCHC: 30.2 g/dL (ref 30.0–36.0)
MCV: 81.9 fL (ref 80.0–100.0)
Monocytes Absolute: 0.7 10*3/uL (ref 0.1–1.0)
Monocytes Relative: 6 %
Neutro Abs: 7.9 10*3/uL — ABNORMAL HIGH (ref 1.7–7.7)
Neutrophils Relative %: 66 %
Platelets: 702 10*3/uL — ABNORMAL HIGH (ref 150–400)
RBC: 3.2 MIL/uL — ABNORMAL LOW (ref 3.87–5.11)
RDW: 16.6 % — ABNORMAL HIGH (ref 11.5–15.5)
WBC: 11.7 10*3/uL — ABNORMAL HIGH (ref 4.0–10.5)
nRBC: 0 % (ref 0.0–0.2)

## 2019-10-09 LAB — HEMOGLOBIN A1C
Hgb A1c MFr Bld: 5.4 % (ref 4.8–5.6)
Mean Plasma Glucose: 108.28 mg/dL

## 2019-10-09 LAB — BPAM RBC
Blood Product Expiration Date: 202109062359
ISSUE DATE / TIME: 202108150124
Unit Type and Rh: 6200

## 2019-10-09 SURGERY — EGD (ESOPHAGOGASTRODUODENOSCOPY)
Anesthesia: Monitor Anesthesia Care

## 2019-10-09 MED ORDER — POTASSIUM CHLORIDE 10 MEQ/100ML IV SOLN
10.0000 meq | INTRAVENOUS | Status: AC
  Administered 2019-10-09 (×2): 10 meq via INTRAVENOUS
  Filled 2019-10-09 (×2): qty 100

## 2019-10-09 MED ORDER — SODIUM CHLORIDE 0.9 % IV SOLN: INTRAVENOUS | Status: DC

## 2019-10-09 MED ORDER — COLLAGENASE 250 UNIT/GM EX OINT
TOPICAL_OINTMENT | Freq: Two times a day (BID) | CUTANEOUS | Status: DC
  Administered 2019-10-11: 1 via TOPICAL
  Filled 2019-10-09 (×3): qty 30

## 2019-10-09 MED ORDER — PROPOFOL 10 MG/ML IV BOLUS
INTRAVENOUS | Status: DC | PRN
  Administered 2019-10-09: 25 mg via INTRAVENOUS

## 2019-10-09 MED ORDER — PROPOFOL 500 MG/50ML IV EMUL
INTRAVENOUS | Status: DC | PRN
  Administered 2019-10-09: 150 ug/kg/min via INTRAVENOUS

## 2019-10-09 NOTE — Progress Notes (Signed)
Pt received from endoscopy.VSS. Pt alert and at baseline orientation. Will continue to monitor.  Versie Starks, RN

## 2019-10-09 NOTE — Progress Notes (Signed)
Pt transferred to air mattress bed. Tolerated well.  Versie Starks, RN

## 2019-10-09 NOTE — Op Note (Signed)
[facility] Patient Name: [place] Procedure Date : 10/09/2019 MRN: [id number] Attending MD: Justice Britain , MD Date of Birth: 05-Jul-1940 CSN: [id number] Age: 79 Admit Type: Inpatient Procedure:                Upper GI endoscopy Indications:              Occult blood in stool Providers:                Justice Britain, MD, Carlyn Reichert, RN, Cletis Athens, Technician Referring MD:             [facility] Medicines:                Monitored Anesthesia Care Complications:            No immediate complications. Estimated Blood Loss:     Estimated blood loss was minimal. Procedure:                Pre-Anesthesia Assessment:                           - Prior to the procedure, a History and Physical                            was performed, and patient medications and                            allergies were reviewed. The patient's tolerance of                            previous anesthesia was also reviewed. The risks                            and benefits of the procedure and the sedation                            options and risks were discussed with the patient.                            All questions were answered, and informed consent                            was obtained. Prior Anticoagulants: The patient has                            taken no previous anticoagulant or antiplatelet                            agents. ASA Grade Assessment: III - A patient with                            severe systemic disease. After reviewing the risks  and benefits, the patient was deemed in                            satisfactory condition to undergo the procedure.                           After obtaining informed consent, the endoscope was                            passed under direct vision. Throughout the                            procedure, the patient's blood pressure, pulse, and                             oxygen saturations were monitored continuously. The                            GIF-H190 ([id number] Olympus gastroscope was                            introduced through the mouth, and advanced to the                            second part of duodenum. The upper GI endoscopy was                            accomplished without difficulty. The patient                            tolerated the procedure. Scope In: Scope Out: Findings:      No gross lesions were noted in the entire esophagus.      A 4 cm hiatal hernia was present.      Striped moderately erythematous mucosa without bleeding was found at the       incisura and in the gastric antrum.      No gross lesions were noted in the entire examined stomach. Biopsies       were taken with a cold forceps for histology and Helicobacter pylori       testing.      Five small angioectasias with typical arborization were found in the       duodenal bulb, in the first portion of the duodenum and in the second       portion of the duodenum. Fulguration to ablate the lesion to prevent       bleeding by argon plasma (right colon settings) was successful. One of       the angioectasias did ooze, to prevent bleeding post-intervention, two       hemostatic clips were successfully placed (MR conditional). There was no       bleeding at the end of the procedure. One of the angioectasias did ooze,       to prevent bleeding post-intervention, one hemostatic clip was       successfully placed (MR conditional). There was no bleeding at the end       of the procedure. Impression:               -  No gross lesions in esophagus.                           - 4 cm hiatal hernia.                           - Erythematous mucosa in the incisura and antrum.                           - No gross lesions in the stomach. Biopsied.                           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 Treated with                            argon plasma coagulation (APC).  Clips (MR                            conditional) were placed to 2 AVMs that had some                            oozing to improve. Recommendation:           - The patient will be observed post-procedure,                            until all discharge criteria are met.                           - Return patient to hospital ward for ongoing care.                           - Resume previous diet.                           - Continue IV PPI BID x 24 hours and then                            transition to PO PPI BID x 34-month Then decrease                            to once daily PPI and continue thereafter.                           - Would hold chemical VTE prophylaxis for 24 hours                            to decrease risk of post-intervention bleeding; use                            SCDs or TED.                           - Await pathology results.                           -  The findings and recommendations were discussed                            with the patient. Procedure Code(s):        --- Professional ---                           [phone], Esophagogastroduodenoscopy, flexible,                            transoral; with biopsy, single or multiple Diagnosis Code(s):        --- Professional ---                           K44.9, Diaphragmatic hernia without obstruction or                            gangrene                           K31.89, Other diseases of stomach and duodenum                           K31.819, Angiodysplasia of stomach and duodenum                            without bleeding                           R19.5, Other fecal abnormalities CPT copyright 2019 [employer] Association. All rights reserved. The codes documented in this report are preliminary and upon coder review may  be revised to meet current compliance requirements. Justice Britain, MD 10/09/2019 10:47:07 AM Number of Addenda: 0

## 2019-10-09 NOTE — Interval H&P Note (Signed)
History and Physical Interval Note:  10/09/2019 9:34 AM  Amber Bean  has presented today for surgery, with the diagnosis of Anemia & +FOBT.  The various methods of treatment have been discussed with the patient and family. After consideration of risks, benefits and other options for treatment, the patient has consented to  Procedure(s): ESOPHAGOGASTRODUODENOSCOPY (EGD) (N/A) as a surgical intervention.  The patient's history has been reviewed, patient examined, no change in status, stable for surgery.  I have reviewed the patient's chart and labs.  Questions were answered to the patient's satisfaction.     [employer]

## 2019-10-09 NOTE — Progress Notes (Signed)
Physical Therapy Wound Evaluation Patient Details  Name: Amber Bean MRN: [id number] Date of Birth: [birth year]  Today's Date: 10/09/2019 Time: 4098-1191 Time Calculation (min): 38 min  Subjective  Subjective: Pt with underlying dementia, stating " You are evil" when explaining hydrotherapy Patient and Family Stated Goals: unable to state Date of Onset:  (unknown) Prior Treatments: dressing  Pain Score:  Pt with 6/10 facial pain score with pulse lavage   Wound Assessment  Pressure Injury 10/07/19 Coccyx Right;Left;Medial Unstageable - Full thickness tissue loss in which the base of the injury is covered by slough (yellow, tan, gray, green or brown) and/or eschar (tan, brown or black) in the wound bed. (Active)  Wound Image   10/09/19 1500  Dressing Type ABD;Barrier Film (skin prep);Gauze (Comment);Normal saline moist dressing 10/09/19 1500  Dressing Changed;Clean;Dry;Intact 10/09/19 1500  Dressing Change Frequency Twice a day 10/09/19 1500  State of Healing Eschar 10/09/19 1500  Site / Wound Assessment Black;Brown;Pink;Bleeding;Yellow;Painful 10/09/19 1500  % Wound base Red or Granulating 5% 10/09/19 1500  % Wound base Yellow/Fibrinous Exudate 10% 10/09/19 1500  % Wound base Black/Eschar 85% 10/09/19 1500  Peri-wound Assessment Erythema (blanchable);Denuded;Maceration 10/09/19 1500  Wound Length (cm) 8.7 cm 10/09/19 1500  Wound Width (cm) 9.4 cm 10/09/19 1500  Wound Surface Area (cm^2) 81.78 cm^2 10/09/19 1500  Margins Unattached edges (unapproximated) 10/09/19 1500  Drainage Amount Moderate 10/09/19 1500  Drainage Description Odor;Purulent;Other (Comment) 10/09/19 1500  Treatment Debridement (Selective);Hydrotherapy (Pulse lavage);Packing (Saline gauze) 10/09/19 1500   Hydrotherapy Pulsed lavage therapy - wound location: sacrum Pulsed Lavage with Suction (psi): 12 psi Pulsed Lavage with Suction - Normal Saline Used: 1000 mL Pulsed Lavage Tip: Tip with splash  shield Selective Debridement Selective Debridement - Location: sacrum  Selective Debridement - Tools Used: Forceps;Scalpel;Scissors Selective Debridement - Tissue Removed: brown eschar, brown gelatinous necrotic tissue   Wound Assessment and Plan  Wound Therapy - Assess/Plan/Recommendations Wound Therapy - Clinical Statement: When RN and PT entered the room she had pulled off her leads and was tangled in the wires. RN placed mitts on pt. During session pt able to pull off and several times swung at/pinched therapy team. Difficult to hold pt in sidelying for debridement and by time measurements were taken and dressing placed pt had had enough. Unable to fully acertain depth and undermining of wound. RN to apply Santyl to necrotic tissue when she changes the dressing later. Pt will benefit from pulse lavage and pulse lavage to decreased bioburden to facilitate wound healing.  Wound Therapy - Functional Problem List: decrease mobility Factors Delaying/Impairing Wound Healing: Multiple medical problems;Immobility Hydrotherapy Plan: Debridement;Pulsatile lavage with suction Wound Therapy - Frequency: 6X / week Wound Therapy - Follow Up Recommendations: Skilled nursing facility Wound Plan: see above  Wound Therapy Goals- Improve the function of patient's integumentary system by progressing the wound(s) through the phases of wound healing (inflammation - proliferation - remodeling) by: Decrease Necrotic Tissue to: 70 Decrease Necrotic Tissue - Progress: Goal set today Increase Granulation Tissue to: 30 Increase Granulation Tissue - Progress: Goal set today Improve Drainage Characteristics: Min Improve Drainage Characteristics - Progress: Goal set today Goals/treatment plan/discharge plan were made with and agreed upon by patient/family: No, Patient unable to participate in goals/treatment/discharge plan and family unavailable Time For Goal Achievement: 7 days Wound Therapy - Potential for Goals:  Fair  Goals will be updated until maximal potential achieved or discharge criteria met.  Discharge criteria: when goals achieved, discharge from hospital, MD decision/surgical intervention, no progress towards goals,  refusal/missing three consecutive treatments without notification or medical reason.  GP    Dani Gobble. Migdalia Dk PT, DPT Acute Rehabilitation Services Pager [phone] Office [phone]  [place] 10/09/2019, 4:39 PM

## 2019-10-09 NOTE — Progress Notes (Signed)
Palliative Medicine Inpatient Follow Up Note  Reason for consult:  Goals of Care  HPI:  Per intake H&P --> [place] E Neely[place] a 79 y.o.femalewith medical history significant ofdementia, UTI, COVID-19 pneumonia in June, diabetes, previous tobacco abuse, iron deficiency anemia coronary artery disease, hypertension, [facility]ub[facility] Patient has been there for the time until the daughter went to visit today and found her in the bad shape. Her mother was weak debilitated and deep sacral decubitus ulcer. She noticed that they have been changing the dressing but also has been having darker stools with suspected GI bleed. She is weak debilitated and worse this year last saw her. She brought her to the ER where she was seen and evaluated. Patient found to be septic by criteria with a deep sacral decubitus ulcer stage IV as well as guaiac positive stool and anemia.  Palliative care was asked to address goals of care in the setting of GIB and sacral wound.   Today's Discussion (10/09/2019): Chart reviewed. Patient just returned from EGD able to respond to verbal though was somnolent.  Per conversation with Thurston Hole she feels very divided about what to do moving forward. Some of her family members want her to pursue hospice care while others what her to continue the present course of care. Thurston Hole explains that she is very bad at making choices and is trying to decide which direction is the best one to travel.   Thurston Hole states, "I want to do the right thing by momma" and I am not sure over what to do. She would like to give [place] a chance to be successful at this point in time given the conversations that she had had with her mother in the past.   Much of our conversation was allowing a safe space to enable Thurston Hole to express her concerns about a variety of things including her living situation, traumas from her youth,  and the dysfunction of her family. She expresses frustration that her siblings do not help out more. She expresses feeling very alone caring for her mother.   I asked Thurston Hole if allowing some additional spiritual support may help her end up in the right space. She expresses that this would be greatly beneficial.   Discussed the importance of continued conversation with family and their  medical providers regarding overall plan of care and treatment options, ensuring decisions are within the context of the patients values and GOCs.  Questions and concerns addressed   Decision Maker: Yvone Neu (daughter) [phone]  SUMMARY OF RECOMMENDATIONS DNAR/DNI  MOST / DNR completed and placed on chart  Appreciate spiritual support for this complex case as patients daughter is struggling to make decisions though ascribes to a strong faith background  Sacral decubitus ulcer - continue hydrotherapy per surgery as tolerated by patient  At the present time patients daughter, Thurston Hole states that she would want to bring her mother home with home health - Shared with her that it is okay to consider hospice as an option. Thurston Hole expresses feeling torn.   TOC --> OP Palliative support  Time Spent: 45 Greater than 50% of the time was spent in counseling and coordination of care ______________________________________________________________________________________ Lamarr Lulas [facility] Palliative Medicine Team Team Cell Phone: [phone] Please utilize secure chat with additional questions, if there is no response within 30 minutes please call the above phone number  Palliative Medicine Team providers are available by phone from 7am to  7pm daily and can be reached through the team cell phone.  Should this patient require assistance outside of these hours, please call the patient's attending physician.

## 2019-10-09 NOTE — Anesthesia Procedure Notes (Signed)
Procedure Name: MAC Date/Time: 10/09/2019 9:53 AM Performed by: Imagene Riches, CRNA Pre-anesthesia Checklist: Patient identified, Emergency Drugs available, Suction available, Patient being monitored and Timeout performed Patient Re-evaluated:Patient Re-evaluated prior to induction Oxygen Delivery Method: Nasal cannula

## 2019-10-09 NOTE — Progress Notes (Signed)
SLP Cancellation Note  Patient Details Name: Amber Bean MRN: [id number] DOB: 08-03-1940   Cancelled treatment:       Reason Eval/Treat Not Completed: Patient at procedure or test/unavailable. EGD planned for today. Pt to remain NPO  Wilma Wuthrich, Riley Nearing 10/09/2019, 8:05 AM

## 2019-10-09 NOTE — Progress Notes (Signed)
PROGRESS NOTE    Amber Bean  SAY:[id number] DOB: 08-13-1940 DOA: 10/07/2019 PCP: Veverly Fells, MD    Chief Complaint  Patient presents with   sacral wound    Brief Narrative:  79 year old lady with prior history of dementia, UTI, COVID-19 pneumonia infection in June 2021, diabetes, iron deficiency anemia, coronary artery disease, hypertension, sacral decubitus ulcer was brought in from SNF for worsening decubitus ulcer wound and generalized weakness and poor oral intake. CT of the abdomen and pelvis shows evidence of sacral decubitus ulcers with multiple air collection within the subcutaneous fat of midline just deep to the ulceration, suspected subcutaneous abscess.  There is circumferential wall thickening over the rectum probably proctitis.  Patient is being admitted for sepsis secondary to infected sacral decubitus ulcer. General surgery consulted and recommended hydrotherapy initially and if that does not work, plan for debridement.  GI consulted for guaiac positive stools. She underwent EGD this am, revealing gastritis and a few duodenal AVM'S. No indication for colonoscopy. SLP evaluation ordered to initiate her diet.   Assessment & Plan:   Principal Problem:   Sepsis (HCC) Active Problems:   Hypothyroidism   T2DM (type 2 diabetes mellitus) (HCC)   ANEMIA-IRON DEFICIENCY   CAD (coronary artery disease)   Mitral valve disease   Coronary artery disease involving native coronary artery of native heart without angina pectoris   Essential hypertension   Dyslipidemia   Sacral decubitus ulcer, stage IV (HCC)   Palliative care by specialist   Goals of care, counseling/discussion   DNR (do not resuscitate)  Sepsis secondary to infected sacral decubitus ulcer:  Improving , BP parameters have improved. Pt has persistent leukocytosis. Lactate normalized.  Pro calcitonin is 0.10.  She was started on broad-spectrum IV antibiotics, continue the same.  General surgery  consulted for possible debridement of the sacral decubitus ulcer.  General surgery recommended hydrotherapy at this time and no debridement at this time.   Patient is currently afebrile and leukocytosis is improving.     Questionable GI bleed heme positive stools/microcytic anemia Patient's daughter reports history of melanotic stools. Patient's baseline hemoglobin is around 9 dropped to a hemoglobin of 7.6  To 7.9 this afternoon. S/p 1 unit of PRBC transfusion.  Continue with IV PPI.  GI consulted and underwent EGD SHOWING gastritis and a few duodenal AVM's  Which were treated. No indication for colonoscopy .     History of coronary artery disease No chest pain at this time.   Essential hypertension Well controlled.     Type 2 diabetes mellitus Pending hemoglobin A1c.  CBG (last 3)  Recent Labs    10/08/19 0905 10/08/19 1108 10/08/19 1614  GLUCAP 90 91 84   Resume SSI.   Hypothyroidism Continue with Synthroid 25 MCG daily.    Pressure injury present on admission. Pressure Injury 10/07/19 Coccyx Right;Left;Medial Unstageable - Full thickness tissue loss in which the base of the injury is covered by slough (yellow, tan, gray, green or brown) and/or eschar (tan, brown or black) in the wound bed. (Active)  10/07/19 2300  Location: Coccyx  Location Orientation: Right;Left;Medial  Staging: Unstageable - Full thickness tissue loss in which the base of the injury is covered by slough (yellow, tan, gray, green or brown) and/or eschar (tan, brown or black) in the wound bed.  Wound Description (Comments):   Present on Admission: Yes   gen surgery on board and planning for hydro therapy.         DVT prophylaxis:  SCD'S Code Status: DNR  Family Communication: none at bedside.  Disposition:   Status is: Inpatient  Remains inpatient appropriate because:Ongoing diagnostic testing needed not appropriate for outpatient work up, IV treatments appropriate due to intensity  of illness or inability to take PO and Inpatient level of care appropriate due to severity of illness   Dispo: The patient is from: SNF              Anticipated d/c is to: pending.               Anticipated d/c date is: > 3 days              Patient currently is not medically stable to d/c.       Consultants:   GASTROENTEROLOGY  General surgery.   Palliative care  Procedures: none.  Antimicrobials: (  Anti-infectives (From admission, onward)   Start     Dose/Rate Route Frequency Ordered Stop   10/08/19 0500  vancomycin (VANCOREADY) IVPB 750 mg/150 mL     Discontinue     750 mg 150 mL/hr over 60 Minutes Intravenous Every 12 hours 10/07/19 1952     10/07/19 2230  piperacillin-tazobactam (ZOSYN) IVPB 3.375 g  Status:  Discontinued        3.375 g 12.5 mL/hr over 240 Minutes Intravenous Every 8 hours 10/07/19 1952 10/07/19 2127   10/07/19 2200  cefTRIAXone (ROCEPHIN) 2 g in sodium chloride 0.9 % 100 mL IVPB     Discontinue     2 g 200 mL/hr over 30 Minutes Intravenous Every 24 hours 10/07/19 2037     10/07/19 2045  vancomycin (VANCOCIN) IVPB 1000 mg/200 mL premix  Status:  Discontinued        1,000 mg 200 mL/hr over 60 Minutes Intravenous  Once 10/07/19 2037 10/07/19 2055   10/07/19 1615  piperacillin-tazobactam (ZOSYN) IVPB 3.375 g        3.375 g 100 mL/hr over 30 Minutes Intravenous  Once 10/07/19 1610 10/07/19 1710   10/07/19 1615  vancomycin (VANCOREADY) IVPB 1500 mg/300 mL        1,500 mg 150 mL/hr over 120 Minutes Intravenous  Once 10/07/19 1610 10/07/19 1845       Subjective: Alert  This am, not in distress, minimally conversational. Says yes to questions.   Objective: Vitals:   10/09/19 1020 10/09/19 1035 10/09/19 1132 10/09/19 1614  BP: (!) 104/25 (!) 129/45 (!) 128/48 (!) 154/50  Pulse: 61 62 (!) 57 82  Resp: 17 (!) 22    Temp: 98.7 F (37.1 C)  97.6 F (36.4 C) 98.6 F (37 C)  TempSrc: Oral  Oral Oral  SpO2: 99% 97% 96% 95%  Weight:      Height:         Intake/Output Summary (Last 24 hours) at 10/09/2019 1630 Last data filed at 10/09/2019 1624 Gross per 24 hour  Intake 2759.97 ml  Output 2650 ml  Net 109.97 ml   Filed Weights   10/07/19 2100  Weight: 68 kg    Examination:  General exam: alert, does not appear to be in distress.  Respiratory system: diminished air entry , no wheezing or rhonchi.  Cardiovascular system: S1S2, RRR, no JVD.  Gastrointestinal system: abdomen is soft, non tender non distended, bowel sounds heard. Central nervous system: alert, has dementia.  Extremities: no cyanosis or clubbing  Skin: unstageable right coccygeal ulcer on the right.  Psychiatry: cannot be assessed.     Data Reviewed: I have personally  reviewed following labs and imaging studies  CBC: Recent Labs  Lab 10/07/19 1507 10/07/19 2157 10/08/19 0036 10/09/19 0145  WBC 14.6* 20.3* 20.6* 11.7*  NEUTROABS 10.9*  --   --  7.9*  HGB 7.6* 7.6* 7.5* 7.9*  HCT 27.5* 27.1* 26.4* 26.2*  MCV 81.1 81.6 80.0 81.9  PLT 837* 775* 763* 702*    Basic Metabolic Panel: Recent Labs  Lab 10/07/19 1507 10/07/19 2157 10/08/19 0036 10/09/19 0145  NA 139  --  138 140  K 3.7  --  4.3 3.1*  CL 103  --  103 104  CO2 26  --  26 25  GLUCOSE 122*  --  105* 83  BUN 12  --  11 8  CREATININE 0.60 0.45 0.54 0.45  CALCIUM 8.9  --  8.7* 8.6*    GFR: Estimated Creatinine Clearance: 57.3 mL/min (by C-G formula based on SCr of 0.45 mg/dL).  Liver Function Tests: Recent Labs  Lab 10/07/19 1507 10/08/19 0036 10/09/19 0145  AST 18 20 16   ALT 32 31 22  ALKPHOS 85 82 74  BILITOT 0.4 0.5 0.5  PROT 5.9* 5.7* 5.0*  ALBUMIN 2.0* 2.0* 1.8*    CBG: Recent Labs  Lab 10/07/19 1441 10/08/19 0905 10/08/19 1108 10/08/19 1614  GLUCAP 123* 90 91 84     Recent Results (from the past 240 hour(s))  Blood Culture (routine x 2)     Status: None (Preliminary result)   Collection Time: 10/07/19  4:06 PM   Specimen: BLOOD RIGHT FOREARM  Result Value  Ref Range Status   Specimen Description BLOOD RIGHT FOREARM  Final   Special Requests   Final    BOTTLES DRAWN AEROBIC AND ANAEROBIC Blood Culture results may not be optimal due to an inadequate volume of blood received in culture bottles   Culture   Final    NO GROWTH 2 DAYS Performed at [facility], [address], [place], [place]    Report Status PENDING  Incomplete  Blood Culture (routine x 2)     Status: None (Preliminary result)   Collection Time: 10/07/19  4:11 PM   Specimen: BLOOD RIGHT HAND  Result Value Ref Range Status   Specimen Description BLOOD RIGHT HAND  Final   Special Requests   Final    BOTTLES DRAWN AEROBIC AND ANAEROBIC Blood Culture adequate volume   Culture   Final    NO GROWTH 2 DAYS Performed at [facility], [address], [place], [place]    Report Status PENDING  Incomplete  SARS Coronavirus 2 by RT PCR (hospital order, performed in [facility] hospital lab) Nasopharyngeal Nasopharyngeal Swab     Status: None   Collection Time: 10/07/19  4:31 PM   Specimen: Nasopharyngeal Swab  Result Value Ref Range Status   SARS Coronavirus 2 NEGATIVE NEGATIVE Final    Comment: (NOTE) SARS-CoV-2 target nucleic acids are NOT DETECTED.  The SARS-CoV-2 RNA is generally detectable in upper and lower respiratory specimens during the acute phase of infection. The lowest concentration of SARS-CoV-2 viral copies this assay can detect is 250 copies / mL. A negative result does not preclude SARS-CoV-2 infection and should not be used as the sole basis for treatment or other patient management decisions.  A negative result may occur with improper specimen collection / handling, submission of specimen other than nasopharyngeal swab, presence of viral mutation(s) within the areas targeted by this assay, and inadequate number of viral copies (<  250 copies / mL). A negative result must be combined with clinical observations, patient history, and  epidemiological information.  Fact Sheet for Patients:   BoilerBrush.com.cy  Fact Sheet for Healthcare Providers: https://pope.com/  This test is not yet approved or  cleared by the [place] FDA and has been authorized for detection and/or diagnosis of SARS-CoV-2 by FDA under an Emergency Use Authorization (EUA).  This EUA will remain in effect (meaning this test can be used) for the duration of the COVID-19 declaration under Section 564(b)(1) of the Act, 21 U.S.C. section 360bbb-3(b)(1), unless the authorization is terminated or revoked sooner.  Performed at [facility], [address], [place], [place] [address]          Radiology Studies: CT ABDOMEN PELVIS WO CONTRAST  Result Date: 10/07/2019 CLINICAL DATA:  Possible intra-abdominal abscess. Sacral wound. Sepsis. EXAM: CT ABDOMEN AND PELVIS WITHOUT CONTRAST TECHNIQUE: Multidetector CT imaging of the abdomen and pelvis was performed following the standard protocol without IV contrast. COMPARISON:  06/04/2018 FINDINGS: Lower chest: Lung bases demonstrate small bilateral pleural effusions. Mild bibasilar dependent atelectasis. Calcification of the mitral valve annulus. Hepatobiliary: Previous cholecystectomy. Liver and biliary tree are normal. Pancreas: Normal. Spleen: Normal. Adrenals/Urinary Tract: Adrenal glands are normal. Kidneys are normal in size without hydronephrosis. No focal mass or nephrolithiasis. Ureters and bladder are unremarkable. Stomach/Bowel: Stomach and small bowel are normal. Previous appendectomy. Mild wall thickening over the rectum which may be due to proctitis. Colon is otherwise unremarkable. Vascular/Lymphatic: Calcified plaque over the abdominal aorta which is normal in caliber. No evidence of adenopathy. Reproductive: Previous hysterectomy. Other: No free fluid or focal inflammatory change within the abdomen. Musculoskeletal: Evidence of patient's  known midline sacral decubitus ulcer. Mottled air collection within the subcutaneous fat right of midline just deep to the ulceration measuring 1.6 x 5 cm possibly developing subcutaneous abscess. Area of ulceration extends up to the superficial surface of the sacrum. Suggestion of minimal bone destruction of the adjacent right side of the sacrum likely representing osteomyelitis. Degenerative change of the spine with posterior fusion hardware intact from L4-S1. IMPRESSION: 1. Evidence of patient's known midline sacral decubitus ulcer. Mottled air collection within the subcutaneous fat right of midline just deep to the ulceration measuring 1.6 x 5 cm possibly developing subcutaneous abscess. Suggestion of minimal bone destruction of the adjacent right side of the sacrum likely representing osteomyelitis. 2. Circumferential wall thickening over the rectum which may be due to acute proctitis. 3. Small bilateral pleural effusions with mild bibasilar atelectasis. 4. Aortic atherosclerosis. Aortic Atherosclerosis (ICD10-I70.0). Electronically Signed   By: Elberta Fortis M.D.   On: 10/07/2019 19:53   DG Chest Port 1 View  Result Date: 10/07/2019 CLINICAL DATA:  Questionable sepsis.  Evaluate for pneumonia. EXAM: PORTABLE CHEST 1 VIEW COMPARISON:  06/05/2018 FINDINGS: Patient rotated left. The Chin overlies the left apex. Prior median sternotomy. Cardiomegaly accentuated by AP portable technique. No pleural effusion or pneumothorax. Diffuse peribronchial thickening. Vague increased density projecting over the left lung base, favored to be due to overlying soft tissues. Clear right lung. IMPRESSION: 1. No acute cardiopulmonary disease. 2. Peribronchial thickening which may relate to chronic bronchitis or smoking. 3. Likely artifactual density projecting over the left lung base on this oblique portable radiograph. If left lower lobe pneumonia is a concern, consider PA and lateral radiographs. Electronically Signed   By:  Jeronimo Greaves M.D.   On: 10/07/2019 16:44        Scheduled Meds:  amitriptyline  150 mg Oral QHS   ascorbic acid  500 mg Oral Daily   atenolol  50 mg Oral Daily   collagenase   Topical BID   diltiazem  120 mg Oral Daily   donepezil  5 mg Oral QHS   ferrous gluconate  324 mg Oral TID WC   gabapentin  100 mg Oral TID   levothyroxine  25 mcg Oral QAC breakfast   lisinopril  5 mg Oral Daily   [START ON 10/11/2019] pantoprazole  40 mg Intravenous Q12H   sertraline  100 mg Oral Daily   thiamine  100 mg Oral Daily   zinc sulfate  220 mg Oral Daily   Continuous Infusions:  cefTRIAXone (ROCEPHIN)  IV 2 g (10/08/19 2257)   lactated ringers 100 mL/hr at 10/09/19 1514   pantoprozole (PROTONIX) infusion 8 mg/hr (10/09/19 0947)   potassium chloride 10 mEq (10/09/19 1615)   vancomycin 750 mg (10/09/19 0431)     LOS: 2 days        Kathlen ModyVijaya Chrishelle Zito, MD [facility]   To contact the attending provider between 7A-7P or the covering provider during after hours 7P-7A, please log into the web site [email or web address] and access using universal [place] password for that web site. If you do not have the password, please call the hospital operator.  10/09/2019, 4:30 PM

## 2019-10-09 NOTE — Progress Notes (Signed)
Pt dentures removed for procedure.  Dentures placed in pink denture cup with patient sticker on lid.  Dentures in cup left at bedside table in patient room on 4E11.

## 2019-10-09 NOTE — Progress Notes (Addendum)
[facility] Progress Note  Day of Surgery  Subjective: CC-  Back from EGD. Starting hydrotherapy for sacral wound today. WBC down 11.7, afebrile  Objective: Vital signs in last 24 hours: Temp:  [97.6 F (36.4 C)-98.6 F (37 C)] 97.6 F (36.4 C) (08/16 0402) Pulse Rate:  [60-68] 60 (08/16 0402) Resp:  [14-20] 14 (08/16 0402) BP: (113-139)/(45-59) 139/48 (08/16 0402) SpO2:  [96 %-99 %] 97 % (08/16 0402) Last BM Date: 10/07/19  Intake/Output from previous day: 08/15 0701 - 08/16 0700 In: 1315.2 [I.V.:1165.2; IV Piggyback:150] Out: 2450 [Urine:2450] Intake/Output this shift: No intake/output data recorded.  PE: Gen:  Alert, NAD, mittens on Card:  RRR Pulm:  rate and effort normal Abd: Soft, NT/ND GU: large sacral wound with necrotic tissue, purulent drainage and foul odor     Lab Results:  Recent Labs    10/08/19 0036 10/09/19 0145  WBC 20.6* 11.7*  HGB 7.5* 7.9*  HCT 26.4* 26.2*  PLT 763* 702*   BMET Recent Labs    10/08/19 0036 10/09/19 0145  NA 138 140  K 4.3 3.1*  CL 103 104  CO2 26 25  GLUCOSE 105* 83  BUN 11 8  CREATININE 0.54 0.45  CALCIUM 8.7* 8.6*   PT/INR Recent Labs    10/07/19 1700 10/08/19 0036  LABPROT 13.7 13.4  INR 1.1 1.1   CMP     Component Value Date/Time   NA 140 10/09/2019 0145   NA 138 09/02/2015 1358   K 3.1 (L) 10/09/2019 0145   CL 104 10/09/2019 0145   CO2 25 10/09/2019 0145   GLUCOSE 83 10/09/2019 0145   BUN 8 10/09/2019 0145   BUN 18 09/02/2015 1358   CREATININE 0.45 10/09/2019 0145   CALCIUM 8.6 (L) 10/09/2019 0145   PROT 5.0 (L) 10/09/2019 0145   PROT 7.2 09/02/2015 1358   ALBUMIN 1.8 (L) 10/09/2019 0145   ALBUMIN 4.3 09/02/2015 1358   AST 16 10/09/2019 0145   ALT 22 10/09/2019 0145   ALKPHOS 74 10/09/2019 0145   BILITOT 0.5 10/09/2019 0145   BILITOT 0.2 09/02/2015 1358   GFRNONAA >60 10/09/2019 0145   GFRAA >60 10/09/2019 0145   Lipase     Component Value Date/Time   LIPASE 41  06/04/2018 2109       Studies/Results: CT ABDOMEN PELVIS WO CONTRAST  Result Date: 10/07/2019 CLINICAL DATA:  Possible intra-abdominal abscess. Sacral wound. Sepsis. EXAM: CT ABDOMEN AND PELVIS WITHOUT CONTRAST TECHNIQUE: Multidetector CT imaging of the abdomen and pelvis was performed following the standard protocol without IV contrast. COMPARISON:  06/04/2018 FINDINGS: Lower chest: Lung bases demonstrate small bilateral pleural effusions. Mild bibasilar dependent atelectasis. Calcification of the mitral valve annulus. Hepatobiliary: Previous cholecystectomy. Liver and biliary tree are normal. Pancreas: Normal. Spleen: Normal. Adrenals/Urinary Tract: Adrenal glands are normal. Kidneys are normal in size without hydronephrosis. No focal mass or nephrolithiasis. Ureters and bladder are unremarkable. Stomach/Bowel: Stomach and small bowel are normal. Previous appendectomy. Mild wall thickening over the rectum which may be due to proctitis. Colon is otherwise unremarkable. Vascular/Lymphatic: Calcified plaque over the abdominal aorta which is normal in caliber. No evidence of adenopathy. Reproductive: Previous hysterectomy. Other: No free fluid or focal inflammatory change within the abdomen. Musculoskeletal: Evidence of patient's known midline sacral decubitus ulcer. Mottled air collection within the subcutaneous fat right of midline just deep to the ulceration measuring 1.6 x 5 cm possibly developing subcutaneous abscess. Area of ulceration extends up to the superficial surface of the sacrum. Suggestion  of minimal bone destruction of the adjacent right side of the sacrum likely representing osteomyelitis. Degenerative change of the spine with posterior fusion hardware intact from L4-S1. IMPRESSION: 1. Evidence of patient's known midline sacral decubitus ulcer. Mottled air collection within the subcutaneous fat right of midline just deep to the ulceration measuring 1.6 x 5 cm possibly developing subcutaneous  abscess. Suggestion of minimal bone destruction of the adjacent right side of the sacrum likely representing osteomyelitis. 2. Circumferential wall thickening over the rectum which may be due to acute proctitis. 3. Small bilateral pleural effusions with mild bibasilar atelectasis. 4. Aortic atherosclerosis. Aortic Atherosclerosis (ICD10-I70.0). Electronically Signed   By: Elberta Fortis M.D.   On: 10/07/2019 19:53   DG Chest Port 1 View  Result Date: 10/07/2019 CLINICAL DATA:  Questionable sepsis.  Evaluate for pneumonia. EXAM: PORTABLE CHEST 1 VIEW COMPARISON:  06/05/2018 FINDINGS: Patient rotated left. The Chin overlies the left apex. Prior median sternotomy. Cardiomegaly accentuated by AP portable technique. No pleural effusion or pneumothorax. Diffuse peribronchial thickening. Vague increased density projecting over the left lung base, favored to be due to overlying soft tissues. Clear right lung. IMPRESSION: 1. No acute cardiopulmonary disease. 2. Peribronchial thickening which may relate to chronic bronchitis or smoking. 3. Likely artifactual density projecting over the left lung base on this oblique portable radiograph. If left lower lobe pneumonia is a concern, consider PA and lateral radiographs. Electronically Signed   By: Jeronimo Greaves M.D.   On: 10/07/2019 16:44    Anti-infectives: Anti-infectives (From admission, onward)   Start     Dose/Rate Route Frequency Ordered Stop   10/08/19 0500  vancomycin (VANCOREADY) IVPB 750 mg/150 mL     Discontinue     750 mg 150 mL/hr over 60 Minutes Intravenous Every 12 hours 10/07/19 1952     10/07/19 2230  piperacillin-tazobactam (ZOSYN) IVPB 3.375 g  Status:  Discontinued        3.375 g 12.5 mL/hr over 240 Minutes Intravenous Every 8 hours 10/07/19 1952 10/07/19 2127   10/07/19 2200  cefTRIAXone (ROCEPHIN) 2 g in sodium chloride 0.9 % 100 mL IVPB     Discontinue     2 g 200 mL/hr over 30 Minutes Intravenous Every 24 hours 10/07/19 2037     10/07/19  2045  vancomycin (VANCOCIN) IVPB 1000 mg/200 mL premix  Status:  Discontinued        1,000 mg 200 mL/hr over 60 Minutes Intravenous  Once 10/07/19 2037 10/07/19 2055   10/07/19 1615  piperacillin-tazobactam (ZOSYN) IVPB 3.375 g        3.375 g 100 mL/hr over 30 Minutes Intravenous  Once 10/07/19 1610 10/07/19 1710   10/07/19 1615  vancomycin (VANCOREADY) IVPB 1500 mg/300 mL        1,500 mg 150 mL/hr over 120 Minutes Intravenous  Once 10/07/19 1610 10/07/19 1845       Assessment/Plan Dementia Recent COVID-19 pneumonia 07/2019 DM Hypothyroidism CAD s/p remote CABG HTN ABL anemia - suspect GI bleed, going for EGD today  Sacral wound - CT 8/14 shows mottled air collection within the subcutaneous fat right of midline just deep to the ulceration measuring 1.6 x 5 cm possibly developing subcutaneous abscess; likely some osteomyelitis of sacrum  D - rocephin 8/14>>, vancomycin 8/14>> FEN - IVF, NPO VTE - SCDs Foley - none Follow up - TBD  Plan - Continue hydrotherapy, BID wet to dry dressing changes with santyl. Air mattress ordered. We will follow. I called and updated the patient's daughter  per request.    LOS: 2 days    Franne Forts, [facility] 10/09/2019, 8:28 AM Please see Amion for pager number during day hours 7:00am-4:30pm

## 2019-10-09 NOTE — Anesthesia Preprocedure Evaluation (Signed)
Anesthesia Evaluation  Patient identified by MRN, date of birth, ID band Patient awake    Reviewed: Allergy & Precautions, NPO status , Patient's Chart, lab work & pertinent test results  Airway Mallampati: II  TM Distance: >3 FB Neck ROM: Full    Dental no notable dental hx.    Pulmonary sleep apnea , former smoker,    Pulmonary exam normal breath sounds clear to auscultation       Cardiovascular hypertension, + CAD  Normal cardiovascular exam Rhythm:Regular Rate:Normal     Neuro/Psych negative neurological ROS  negative psych ROS   GI/Hepatic Neg liver ROS, GERD  ,  Endo/Other  diabetesHypothyroidism   Renal/GU negative Renal ROS  negative genitourinary   Musculoskeletal negative musculoskeletal ROS (+)   Abdominal   Peds negative pediatric ROS (+)  Hematology negative hematology ROS (+) anemia ,   Anesthesia Other Findings   Reproductive/Obstetrics negative OB ROS                             Anesthesia Physical Anesthesia Plan  ASA: III  Anesthesia Plan: MAC   Post-op Pain Management:    Induction: Intravenous  PONV Risk Score and Plan: 2 and Ondansetron, Midazolam and Treatment may vary due to age or medical condition  Airway Management Planned: Natural Airway  Additional Equipment:   Intra-op Plan:   Post-operative Plan:   Informed Consent: I have reviewed the patients History and Physical, chart, labs and discussed the procedure including the risks, benefits and alternatives for the proposed anesthesia with the patient or authorized representative who has indicated his/her understanding and acceptance.     Dental advisory given  Plan Discussed with: CRNA  Anesthesia Plan Comments:         Anesthesia Quick Evaluation

## 2019-10-09 NOTE — Transfer of Care (Signed)
Immediate Anesthesia Transfer of Care Note  Patient: Amber Bean  Procedure(s) Performed: ESOPHAGOGASTRODUODENOSCOPY (EGD) (N/A ) HOT HEMOSTASIS (ARGON PLASMA COAGULATION/BICAP) (N/A ) BIOPSY HEMOSTASIS CLIP PLACEMENT  Patient Location: Endoscopy Unit  Anesthesia Type:MAC  Level of Consciousness: drowsy  Airway & Oxygen Therapy: Patient Spontanous Breathing and Patient connected to nasal cannula oxygen  Post-op Assessment: Report given to RN and Post -op Vital signs reviewed and stable  Post vital signs: Reviewed and stable  Last Vitals:  Vitals Value Taken Time  BP    Temp    Pulse 61 10/09/19 1020  Resp 17 10/09/19 1020  SpO2 99 % 10/09/19 1020  Vitals shown include unvalidated device data.  Last Pain:  Vitals:   10/09/19 0855  TempSrc: Oral  PainSc:          Complications: No complications documented.

## 2019-10-09 NOTE — Progress Notes (Signed)
Consult received regarding Home Hospice wound Care- reached out to daughter Amber Bean to discuss Home Hospice and wound care and answer questions. (Hospice does not provide wound VAC drsg changes or hydrotherapy) they will support wound care needs in general. Daughter reports she is still trying to decide on home hospice. Provided contact and phone # should she have any further questions. TOC to continue to follow for transition needs/plan.

## 2019-10-10 ENCOUNTER — Encounter (HOSPITAL_COMMUNITY): Payer: Self-pay | Admitting: Gastroenterology

## 2019-10-10 ENCOUNTER — Other Ambulatory Visit: Payer: Self-pay | Admitting: Physician Assistant

## 2019-10-10 ENCOUNTER — Inpatient Hospital Stay (HOSPITAL_COMMUNITY): Payer: Medicare Other

## 2019-10-10 LAB — CBC WITH DIFFERENTIAL/PLATELET
Abs Immature Granulocytes: 0.08 10*3/uL — ABNORMAL HIGH (ref 0.00–0.07)
Basophils Absolute: 0.1 10*3/uL (ref 0.0–0.1)
Basophils Relative: 1 %
Eosinophils Absolute: 0.5 10*3/uL (ref 0.0–0.5)
Eosinophils Relative: 5 %
HCT: 27.7 % — ABNORMAL LOW (ref 36.0–46.0)
Hemoglobin: 8 g/dL — ABNORMAL LOW (ref 12.0–15.0)
Immature Granulocytes: 1 %
Lymphocytes Relative: 18 %
Lymphs Abs: 2 10*3/uL (ref 0.7–4.0)
MCH: 23.7 pg — ABNORMAL LOW (ref 26.0–34.0)
MCHC: 28.9 g/dL — ABNORMAL LOW (ref 30.0–36.0)
MCV: 82.2 fL (ref 80.0–100.0)
Monocytes Absolute: 0.7 10*3/uL (ref 0.1–1.0)
Monocytes Relative: 6 %
Neutro Abs: 8.1 10*3/uL — ABNORMAL HIGH (ref 1.7–7.7)
Neutrophils Relative %: 69 %
Platelets: 655 10*3/uL — ABNORMAL HIGH (ref 150–400)
RBC: 3.37 MIL/uL — ABNORMAL LOW (ref 3.87–5.11)
RDW: 16.8 % — ABNORMAL HIGH (ref 11.5–15.5)
WBC: 11.4 10*3/uL — ABNORMAL HIGH (ref 4.0–10.5)
nRBC: 0 % (ref 0.0–0.2)

## 2019-10-10 LAB — VANCOMYCIN, TROUGH: Vancomycin Tr: 11 ug/mL — ABNORMAL LOW (ref 15–20)

## 2019-10-10 LAB — BASIC METABOLIC PANEL
Anion gap: 10 (ref 5–15)
BUN: <5 mg/dL — ABNORMAL LOW (ref 8–23)
CO2: 25 mmol/L (ref 22–32)
Calcium: 8.6 mg/dL — ABNORMAL LOW (ref 8.9–10.3)
Chloride: 106 mmol/L (ref 98–111)
Creatinine, Ser: 0.47 mg/dL (ref 0.44–1.00)
GFR calc Af Amer: >60 mL/min (ref >60)
GFR calc non Af Amer: >60 mL/min (ref >60)
Glucose, Bld: 77 mg/dL (ref 70–99)
Potassium: 3.1 mmol/L — ABNORMAL LOW (ref 3.5–5.1)
Sodium: 141 mmol/L (ref 135–145)

## 2019-10-10 LAB — MAGNESIUM: Magnesium: 1.5 mg/dL — ABNORMAL LOW (ref 1.7–2.4)

## 2019-10-10 LAB — SURGICAL PATHOLOGY

## 2019-10-10 MED ORDER — VANCOMYCIN HCL IN DEXTROSE 1-5 GM/200ML-% IV SOLN
1000.0000 mg | Freq: Two times a day (BID) | INTRAVENOUS | Status: DC
  Administered 2019-10-10 – 2019-10-14 (×8): 1000 mg via INTRAVENOUS
  Filled 2019-10-10 (×8): qty 200

## 2019-10-10 MED ORDER — PANTOPRAZOLE SODIUM 40 MG PO TBEC
40.0000 mg | DELAYED_RELEASE_TABLET | Freq: Two times a day (BID) | ORAL | Status: DC
  Administered 2019-10-10 – 2019-10-14 (×7): 40 mg via ORAL
  Filled 2019-10-10 (×8): qty 1

## 2019-10-10 MED ORDER — RESOURCE THICKENUP CLEAR PO POWD
ORAL | Status: DC | PRN
  Filled 2019-10-10: qty 125

## 2019-10-10 MED ORDER — VANCOMYCIN HCL 1250 MG/250ML IV SOLN: 1250.0000 mg | Freq: Two times a day (BID) | INTRAVENOUS | Status: DC

## 2019-10-10 MED ORDER — MAGNESIUM SULFATE 2 GM/50ML IV SOLN
2.0000 g | Freq: Once | INTRAVENOUS | Status: AC
  Administered 2019-10-10: 2 g via INTRAVENOUS
  Filled 2019-10-10: qty 50

## 2019-10-10 MED ORDER — CHLORHEXIDINE GLUCONATE CLOTH 2 % EX PADS
6.0000 | MEDICATED_PAD | Freq: Every day | CUTANEOUS | Status: DC
  Administered 2019-10-10 – 2019-10-14 (×5): 6 via TOPICAL

## 2019-10-10 MED ORDER — POTASSIUM CHLORIDE 10 MEQ/100ML IV SOLN
10.0000 meq | INTRAVENOUS | Status: AC
  Administered 2019-10-10 (×2): 10 meq via INTRAVENOUS
  Filled 2019-10-10 (×2): qty 100

## 2019-10-10 NOTE — Progress Notes (Signed)
   10/10/19 1448  Clinical Encounter Type  Visited With Family  Visit Type Initial;Spiritual support  Referral From Palliative care team  Consult/Referral To Chaplain  Spiritual Encounters  Spiritual Needs Prayer  Stress Factors  Family Stress Factors Family relationships;Health changes;Lack of knowledge  This chaplain responded to PMT consult for spiritual care.  The Pt. RN-Blaire was contacted before the phone call. This chaplain phoned the Pt. daughter-Anne [phone]. The chaplain listened to Thurston Hole share the Pt.  experience at [facility] and the pieces of the Pt. story effecting Anne's role as a decision maker for .   The chaplain heard Thurston Hole describe "the butterflies in her stomach" as she thinks about the best decision for the Pt. care.  Anne's faith in God which she shares with the Pt, past family experiences, and the Pt. response "no" to Hospice care is influencing Anne's care decisions.   Thurston Hole is anticipating a goals of care discussion with the medical team.  The chaplain understands Thurston Hole needs more information about wound care and the role of antibiotics. Will the wound get better?  Thurston Hole was clear in her desire for the Pt. to keep her dignity and not suffer. The chaplain understands Thurston Hole will appreciate more information about Home with Hospice and has two sisters with different opinions who are influencing Anne's decisions for the Pt. The chaplain accepted Anne's invitation for prayer and F/U spiritual care.

## 2019-10-10 NOTE — Progress Notes (Signed)
Physical Therapy Wound Treatment Patient Details  Name: Amber Bean MRN: [id number] Date of Birth: 1940/05/30  Today's Date: 10/10/2019 Time: 1020-1109 Time Calculation (min): 49 min  Subjective  Subjective: Pt much more agreeable to therapy today. Even smiling at therapists at end of session  Patient and Family Stated Goals: unable to state Date of Onset:  (unknown) Prior Treatments: dressing  Pain Score:  Pt premedicated and exhibits 4/10 facial pain score during pulse lavage.   Wound Assessment  Pressure Injury 10/07/19 Coccyx Right;Left;Medial Unstageable - Full thickness tissue loss in which the base of the injury is covered by slough (yellow, tan, gray, green or brown) and/or eschar (tan, brown or black) in the wound bed. (Active)  Wound Image   10/09/19 1500  Dressing Type ABD;Gauze (Comment);Normal saline moist dressing;Barrier Film (skin prep) 10/10/19 1300  Dressing Clean;Dry;Intact 10/10/19 1300  Dressing Change Frequency Twice a day 10/10/19 1300  State of Healing Eschar 10/10/19 1300  Site / Wound Assessment Brown;Yellow;Friable 10/10/19 1300  % Wound base Red or Granulating 5% 10/10/19 1300  % Wound base Yellow/Fibrinous Exudate 10% 10/10/19 1300  % Wound base Black/Eschar 85% 10/10/19 1300  Peri-wound Assessment Erythema (blanchable);Maceration;Denuded 10/10/19 1300  Wound Length (cm) 8.7 cm 10/10/19 1300  Wound Width (cm) 9.4 cm 10/10/19 1300  Wound Depth (cm) 3.4 cm 10/10/19 1300  Wound Surface Area (cm^2) 81.78 cm^2 10/10/19 1300  Wound Volume (cm^3) 278.05 cm^3 10/10/19 1300  Undermining (cm) 1.4 cm at 12 o'clock and 5.1 cm at 3 o'clock 10/10/19 1300  Margins Unattached edges (unapproximated) 10/10/19 1300  Drainage Amount Moderate 10/10/19 1300  Drainage Description Odor;Purulent 10/10/19 1300  Treatment Debridement (Selective);Packing (Saline gauze);Hydrotherapy (Pulse lavage) 10/10/19 1300  Santyl applied to necrotic tissue Hydrotherapy Pulsed lavage  therapy - wound location: sacrum Pulsed Lavage with Suction (psi): 12 psi Pulsed Lavage with Suction - Normal Saline Used: 1000 mL Pulsed Lavage Tip: Tip with splash shield Selective Debridement Selective Debridement - Location: sacrum  Selective Debridement - Tools Used: Forceps;Scalpel;Scissors Selective Debridement - Tissue Removed: brown eschar, brown gelatinous necrotic tissue   Wound Assessment and Plan  Wound Therapy - Assess/Plan/Recommendations Wound Therapy - Clinical Statement: RN administered pain medication prior to session. That combined with seeing pt earlier in the day improved pt behavior with treatment today. Pt tolerated more thorough measurement today. Pt will continue to benefit from pulse lavage and selective debridement to decrease bioburden to promote wound healing.  Wound Therapy - Functional Problem List: decrease mobility Factors Delaying/Impairing Wound Healing: Multiple medical problems;Immobility Hydrotherapy Plan: Debridement;Pulsatile lavage with suction Wound Therapy - Frequency: 6X / week Wound Therapy - Follow Up Recommendations: Skilled nursing facility Wound Plan: see above  Wound Therapy Goals- Improve the function of patient's integumentary system by progressing the wound(s) through the phases of wound healing (inflammation - proliferation - remodeling) by: Decrease Necrotic Tissue to: 70 Decrease Necrotic Tissue - Progress: Goal set today Increase Granulation Tissue to: 30 Increase Granulation Tissue - Progress: Goal set today Improve Drainage Characteristics: Min Improve Drainage Characteristics - Progress: Goal set today Goals/treatment plan/discharge plan were made with and agreed upon by patient/family: No, Patient unable to participate in goals/treatment/discharge plan and family unavailable Time For Goal Achievement: 7 days Wound Therapy - Potential for Goals: Fair  Goals will be updated until maximal potential achieved or discharge  criteria met.  Discharge criteria: when goals achieved, discharge from hospital, MD decision/surgical intervention, no progress towards goals, refusal/missing three consecutive treatments without notification or medical reason.  GP    Dani Gobble. Migdalia Dk PT, DPT Acute Rehabilitation Services Pager [phone] Office [phone]  [place] 10/10/2019, 1:43 PM

## 2019-10-10 NOTE — Progress Notes (Signed)
Modified Barium Swallow Progress Note  Patient Details  Name: Amber Bean MRN: [id number] Date of Birth: [birth year]  Today's Date: 10/10/2019  Modified Barium Swallow completed.  Full report located under Chart Review in the Imaging Section.  Brief recommendations include the following:  Clinical Impression  Pt presents with oropharyngeal dysphagia characterized by impaired mastication, reduced lingual retraction, and a pharyngeal delay. Mastication was significantly prolonged and mechanical soft (chopped) solids were ultimately spat out by pt due to her difficulty with mastication. She demonstrated vallecular residue with solids and penetration (PAS 3) of thin liquids which was inconsistently eliminated with prompted use of smaller boluses; however, she exhibited difficulty consistently utilizing this strategy. Pt demonstrated throat clearing between swallows of thin liquids, and aspiration of penetrated material is therefore suspected. No instances of penetration/aspiration were noted with nectar thick liquids. A cricopharyngeal bar was noted during deglutition of liquids but esophageal phase was [facility]. A dysphagia 1 (puree) diet with nectar thick liquids is recommended at this time. SLP will continue to follow pt and prognosis for advancement is judged to be good if her mentation improves further.    Swallow Evaluation Recommendations       SLP Diet Recommendations: Dysphagia 1 (Puree) solids;Nectar thick liquid   Liquid Administration via: Cup;Straw   Medication Administration: Whole meds with liquid   Supervision: Staff to assist with self feeding   Compensations: Slow rate;Small sips/bites   Postural Changes: Seated upright at 90 degrees   Oral Care Recommendations: Oral care BID       Bernetta Sutley I. Vear Clock, MS, CCC-SLP Acute Rehabilitation Services Office number [phone] Pager [phone]  Scheryl Marten 10/10/2019,2:32 PM

## 2019-10-10 NOTE — Progress Notes (Addendum)
Pharmacy Antibiotic Note  Amber Bean is a 79 y.o. female admitted on 10/07/2019 with sepsis.  Pharmacy has been consulted for Ceftriaxone and Vancomycin dosing.  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 Surgery following and recommending hydrotherapy. WBC 11.4, Scr 0.47 (CrCl 57 mL/min). Afebrile. Vancomycin trough came back subtherapeutic at 11 this morning - received AM vanc dose already.   Plan:  - Ceftriaxone 2g IV every 24 hours  - Increase vancomycin to 1000 mg IV every 12 hours (calculating estAUC ~525) - Monitor patients renal function and urine output  - De-escalate ABX when appropriate    Height: 5\' 7"  (170.2 cm) Weight: 68 kg (149 lb 14.6 oz) (estimated) IBW/kg (Calculated) : 61.6  Temp (24hrs), Avg:98 F (36.7 C), Min:97.6 F (36.4 C), Max:98.7 F (37.1 C)  Recent Labs  Lab 10/07/19 1506 10/07/19 1507 10/07/19 1700 10/07/19 2157 10/08/19 0036 10/09/19 0145 10/10/19 0437  WBC  --  14.6*  --  20.3* 20.6* 11.7* 11.4*  CREATININE  --  0.60  --  0.45 0.54 0.45 0.47  LATICACIDVEN 1.2  --  2.5* 1.4  --   --   --   VANCOTROUGH  --   --   --   --   --   --  11*    Estimated Creatinine Clearance: 57.3 mL/min (by C-G formula based on SCr of 0.47 mg/dL).    Allergies  Allergen Reactions  . Morphine And Related Other (See Comments) and Hives    "makes me mean as a snake"   . Nitrofurantoin Other (See Comments)    Ruptured tendin  Ruptured tendin   . Trazodone And Nefazodone Rash  . Contrast Media [Iodinated Diagnostic Agents] Other (See Comments)    AFIB  . Morphine     agression  . Ciprocin-Fluocin-Procin  [Fluocinolone] Rash  . Ciprofloxacin Rash    Antimicrobials this admission: Zosyn 8/14 x1 Vancomycin 8/14>> Ceftriaxone 8/14>>    Dose adjustments this admission: 8/17 VT 11- increase vancomycin to 1000 mg IV every 12 hours  Microbiology results: BCx 8/14: ngtd  COVID PCR 8/14: neg  UCx 8/17: sent    Thank you for allowing pharmacy to be a part of this patient's care.  9/17, PharmD, BCCCP Clinical Pharmacist  Phone: [phone] 10/10/2019 7:44 AM  Please check AMION for all [facility] phone numbers After 10:00 PM, call Main Pharmacy [phone]

## 2019-10-10 NOTE — Progress Notes (Signed)
  Speech Language Pathology Treatment: Dysphagia  Patient Details Name: Amber Bean MRN: [id number] DOB: 1940/11/25 Today's Date: 10/10/2019 Time: 6433-2951 SLP Time Calculation (min) (ACUTE ONLY): 15 min  Assessment / Plan / Recommendation Clinical Impression  Pt was seen for dysphagia treatment. She was alert and cooperative throughout the session. She tolerated puree solids without overt s/sx of aspiration but exhibited coughing with ice chips, thin liquids, and regular texture solids, suggesting aspiration. Mastication was prolonged with regular texture solids, but no significant oral residue was noted. A modified barium swallow study is recommended to further assess swallow function and it is currently scheduled for today at 1300.    HPI HPI: Pt is a 79 yo female with recent hospitalization [facility]) in June for UTI/colitis/COVID. She is admitted from SNF with sepsis from sacral ulcer. GI also consulted for possible GIB. MBS was completed at [facility] in June 2021 revealing cord level penetration with thin/nectar thick liquids. FEES repeated several days later showed improvements including adequate pharyngeal function but reduced oral control and coordination. Dys 2 (chopped) diet and thin liquids recommended. PMH includes: dementia, DM, previous tobacco use, anemia, CAD, HTN, GERD      SLP Plan  MBS;New goals to be determined pending instrumental study       Recommendations  Diet recommendations: NPO Medication Administration: Crushed with puree                Oral Care Recommendations: Oral care QID Follow up Recommendations: Skilled Nursing facility SLP Visit Diagnosis: Dysphagia, unspecified (R13.10) Plan: MBS;New goals to be determined pending instrumental study       Vibha Ferdig I. Vear Clock, MS, CCC-SLP Acute Rehabilitation Services Office number [phone] Pager [phone]                 Scheryl Marten 10/10/2019, 11:32 AM

## 2019-10-10 NOTE — Progress Notes (Signed)
PROGRESS NOTE    Amber Bean  ZWC:[id number] DOB: 1941/01/15 DOA: 10/07/2019 PCP: Veverly Fells, MD    Chief Complaint  Patient presents with  . sacral wound    Brief Narrative:  79 year old lady with prior history of dementia, UTI, COVID-19 pneumonia infection in June 2021, diabetes, iron deficiency anemia, coronary artery disease, hypertension, sacral decubitus ulcer was brought in from SNF for worsening decubitus ulcer wound and generalized weakness and poor oral intake. CT of the abdomen and pelvis shows evidence of sacral decubitus ulcers with multiple air collection within the subcutaneous fat of midline just deep to the ulceration, suspected subcutaneous abscess.  There is circumferential wall thickening over the rectum probably proctitis.  Patient admitted for sepsis secondary to infected sacral decubitus ulcer. General surgery consulted and recommended hydrotherapy initially and if that does not work, plan for debridement.  GI consulted for guaiac positive stools. She underwent EGD , revealing gastritis and a few duodenal AVM'S. No indication for colonoscopy. SLP evaluation ordered to initiate her diet. She underwent MBS and was started on dysphagia 1 diet with pureed solids and nectar thick liquid.  She is currently undergoing hydrotherapy for her stage 4 sacral decubitus ulcer. Meanwhile in view of her clinical deterioration, advanced dementia, poor progression, poor oral intake, palliative care consulted for goals of care discussions.  Pt seen and examined. She appears calm and said good morning on verbal cues, opened her eyes.   Assessment & Plan:   Principal Problem:   Sepsis (HCC) Active Problems:   Hypothyroidism   T2DM (type 2 diabetes mellitus) (HCC)   ANEMIA-IRON DEFICIENCY   CAD (coronary artery disease)   Mitral valve disease   Coronary artery disease involving native coronary artery of native heart without angina pectoris   Essential hypertension    Dyslipidemia   Sacral decubitus ulcer, stage IV (HCC)   Palliative care by specialist   Goals of care, counseling/discussion   DNR (do not resuscitate)  Sepsis secondary to infected sacral decubitus ulcer:  Improving , BP parameters have improved, improving wbc count, . Lactate normalized.  Pro calcitonin is 0.10.  She was started on broad-spectrum IV antibiotics, continue the same.  General surgery consulted for possible debridement of the sacral decubitus ulcer.  General surgery recommended hydrotherapy at this time and no debridement at this time.  She remained afebrile.     Questionable GI bleed heme positive stools/microcytic anemia Patient's daughter reports history of melanotic stools. Patient's baseline hemoglobin is around 9 dropped to a hemoglobin of 7.6  on admission. S/p 1 unit of PRBC transfusion. repeat hemoglobin is around 8.  GI consulted and underwent EGD SHOWING gastritis and a few duodenal AVM's  Which were treated. No indication for colonoscopy .  Continue with PPI.   Dementia:  Resume aricept.    Hypokalemia and hypo magnesia:  Replaced, repeat in am.   History of coronary artery disease No chest pain at this time.   Essential hypertension Well controlled BP parameters.     Type 2 diabetes mellitus  hemoglobin A1c is 5.4   CBG (last 3)  Recent Labs    10/08/19 0905 10/08/19 1108 10/08/19 1614  GLUCAP 90 91 84   Resume SSI.   Hypothyroidism Continue with Synthroid 25 MCG daily.    Pressure injury present on admission. Pressure Injury 10/07/19 Coccyx Right;Left;Medial Unstageable - Full thickness tissue loss in which the base of the injury is covered by slough (yellow, tan, gray, green or brown) and/or eschar (tan,  brown or black) in the wound bed. (Active)  10/07/19 2300  Location: Coccyx  Location Orientation: Right;Left;Medial  Staging: Unstageable - Full thickness tissue loss in which the base of the injury is covered by slough  (yellow, tan, gray, green or brown) and/or eschar (tan, brown or black) in the wound bed.  Wound Description (Comments):   Present on Admission: Yes   gen surgery on board and planning for hydro therapy.     Thrombocytosis:  Probably from the infection and anemia.  Continue to monitor. Improving.    DVT prophylaxis: SCD'S Code Status: DNR  Family Communication: none at bedside.  Disposition:   Status is: Inpatient  Remains inpatient appropriate because:Ongoing diagnostic testing needed not appropriate for outpatient work up, IV treatments appropriate due to intensity of illness or inability to take PO and Inpatient level of care appropriate due to severity of illness   Dispo: The patient is from: SNF              Anticipated d/c is to: pending.               Anticipated d/c date is: > 3 days              Patient currently is not medically stable to d/c.       Consultants:   GASTROENTEROLOGY  General surgery.   Palliative care  Procedures: none.  Antimicrobials: (  Anti-infectives (From admission, onward)   Start     Dose/Rate Route Frequency Ordered Stop   10/10/19 1900  vancomycin (VANCOREADY) IVPB 1250 mg/250 mL  Status:  Discontinued        1,250 mg 166.7 mL/hr over 90 Minutes Intravenous Every 12 hours 10/10/19 0815 10/10/19 0819   10/10/19 1900  vancomycin (VANCOCIN) IVPB 1000 mg/200 mL premix     Discontinue     1,000 mg 200 mL/hr over 60 Minutes Intravenous Every 12 hours 10/10/19 0819     10/08/19 0500  vancomycin (VANCOREADY) IVPB 750 mg/150 mL  Status:  Discontinued        750 mg 150 mL/hr over 60 Minutes Intravenous Every 12 hours 10/07/19 1952 10/10/19 0815   10/07/19 2230  piperacillin-tazobactam (ZOSYN) IVPB 3.375 g  Status:  Discontinued        3.375 g 12.5 mL/hr over 240 Minutes Intravenous Every 8 hours 10/07/19 1952 10/07/19 2127   10/07/19 2200  cefTRIAXone (ROCEPHIN) 2 g in sodium chloride 0.9 % 100 mL IVPB     Discontinue     2 g 200  mL/hr over 30 Minutes Intravenous Every 24 hours 10/07/19 2037     10/07/19 2045  vancomycin (VANCOCIN) IVPB 1000 mg/200 mL premix  Status:  Discontinued        1,000 mg 200 mL/hr over 60 Minutes Intravenous  Once 10/07/19 2037 10/07/19 2055   10/07/19 1615  piperacillin-tazobactam (ZOSYN) IVPB 3.375 g        3.375 g 100 mL/hr over 30 Minutes Intravenous  Once 10/07/19 1610 10/07/19 1710   10/07/19 1615  vancomycin (VANCOREADY) IVPB 1500 mg/300 mL        1,500 mg 150 mL/hr over 120 Minutes Intravenous  Once 10/07/19 1610 10/07/19 1845       Subjective: Does not appear to be in distress. On verbal cues, she opened her eyes and said good morning in response. No other complaints.   Objective: Vitals:   10/10/19 0016 10/10/19 0354 10/10/19 0728 10/10/19 1234  BP: (!) 138/51 (!) 126/52 (!) 146/50 Marland Kitchen)  130/43  Pulse: 62 63 71 (!) 57  Resp: 17 18 16 18   Temp: 97.8 F (36.6 C) 97.9 F (36.6 C) 98 F (36.7 C) 97.7 F (36.5 C)  TempSrc: Oral Oral Axillary Axillary  SpO2: 94% 93% 94% 93%  Weight:      Height:        Intake/Output Summary (Last 24 hours) at 10/10/2019 1436 Last data filed at 10/10/2019 0650 Gross per 24 hour  Intake 2759.97 ml  Output 3400 ml  Net -640.03 ml   Filed Weights   10/07/19 2100  Weight: 68 kg    Examination:  General exam: frail elderly lady notin any distress.  Respiratory system: Decreased air entry at bases, no wheezing or rhonchi, no tachypnea Cardiovascular system: S1-S2 heard, regular rate rhythm, no JVD Gastrointestinal system: Abdomen is soft, nontender Central nervous system: Alert able to answer some simple questions not in any distress. Extremities: No pedal edema or cyanosis Skin: unstageable right coccygeal ulcer on the right.  Psychiatry: Cannot be assessed    Data Reviewed: I have personally reviewed following labs and imaging studies  CBC: Recent Labs  Lab 10/07/19 1507 10/07/19 2157 10/08/19 0036 10/09/19 0145  10/10/19 0437  WBC 14.6* 20.3* 20.6* 11.7* 11.4*  NEUTROABS 10.9*  --   --  7.9* 8.1*  HGB 7.6* 7.6* 7.5* 7.9* 8.0*  HCT 27.5* 27.1* 26.4* 26.2* 27.7*  MCV 81.1 81.6 80.0 81.9 82.2  PLT 837* 775* 763* 702* 655*    Basic Metabolic Panel: Recent Labs  Lab 10/07/19 1507 10/07/19 2157 10/08/19 0036 10/09/19 0145 10/10/19 0437  NA 139  --  138 140 141  K 3.7  --  4.3 3.1* 3.1*  CL 103  --  103 104 106  CO2 26  --  26 25 25   GLUCOSE 122*  --  105* 83 77  BUN 12  --  11 8 <5*  CREATININE 0.60 0.45 0.54 0.45 0.47  CALCIUM 8.9  --  8.7* 8.6* 8.6*  MG  --   --   --   --  1.5*    GFR: Estimated Creatinine Clearance: 57.3 mL/min (by C-G formula based on SCr of 0.47 mg/dL).  Liver Function Tests: Recent Labs  Lab 10/07/19 1507 10/08/19 0036 10/09/19 0145  AST 18 20 16   ALT 32 31 22  ALKPHOS 85 82 74  BILITOT 0.4 0.5 0.5  PROT 5.9* 5.7* 5.0*  ALBUMIN 2.0* 2.0* 1.8*    CBG: Recent Labs  Lab 10/07/19 1441 10/08/19 0905 10/08/19 1108 10/08/19 1614  GLUCAP 123* 90 91 84     Recent Results (from the past 240 hour(s))  Blood Culture (routine x 2)     Status: None (Preliminary result)   Collection Time: 10/07/19  4:06 PM   Specimen: BLOOD RIGHT FOREARM  Result Value Ref Range Status   Specimen Description BLOOD RIGHT FOREARM  Final   Special Requests   Final    BOTTLES DRAWN AEROBIC AND ANAEROBIC Blood Culture results may not be optimal due to an inadequate volume of blood received in culture bottles   Culture   Final    NO GROWTH 3 DAYS Performed at [facility], [address] [place], [place] [address]    Report Status PENDING  Incomplete  Blood Culture (routine x 2)     Status: None (Preliminary result)   Collection Time: 10/07/19  4:11 PM   Specimen: BLOOD RIGHT HAND  Result Value Ref Range Status   Specimen  Description BLOOD RIGHT HAND  Final   Special Requests   Final    BOTTLES DRAWN AEROBIC AND ANAEROBIC Blood Culture adequate volume   Culture    Final    NO GROWTH 3 DAYS Performed at [facility], [address], [place], [place] [address]    Report Status PENDING  Incomplete  SARS Coronavirus 2 by RT PCR (hospital order, performed in [facility] hospital lab) Nasopharyngeal Nasopharyngeal Swab     Status: None   Collection Time: 10/07/19  4:31 PM   Specimen: Nasopharyngeal Swab  Result Value Ref Range Status   SARS Coronavirus 2 NEGATIVE NEGATIVE Final    Comment: (NOTE) SARS-CoV-2 target nucleic acids are NOT DETECTED.  The SARS-CoV-2 RNA is generally detectable in upper and lower respiratory specimens during the acute phase of infection. The lowest concentration of SARS-CoV-2 viral copies this assay can detect is 250 copies / mL. A negative result does not preclude SARS-CoV-2 infection and should not be used as the sole basis for treatment or other patient management decisions.  A negative result may occur with improper specimen collection / handling, submission of specimen other than nasopharyngeal swab, presence of viral mutation(s) within the areas targeted by this assay, and inadequate number of viral copies (<250 copies / mL). A negative result must be combined with clinical observations, patient history, and epidemiological information.  Fact Sheet for Patients:   BoilerBrush.com.cy  Fact Sheet for Healthcare Providers: https://pope.com/  This test is not yet approved or  cleared by the [place] FDA and has been authorized for detection and/or diagnosis of SARS-CoV-2 by FDA under an Emergency Use Authorization (EUA).  This EUA will remain in effect (meaning this test can be used) for the duration of the COVID-19 declaration under Section 564(b)(1) of the Act, 21 U.S.C. section 360bbb-3(b)(1), unless the authorization is terminated or revoked sooner.  Performed at [facility], [address] [place], [place] [address]          Radiology  Studies: No results found.      Scheduled Meds: . amitriptyline  150 mg Oral QHS  . ascorbic acid  500 mg Oral Daily  . atenolol  50 mg Oral Daily  . Chlorhexidine Gluconate Cloth  6 each Topical Daily  . collagenase   Topical BID  . diltiazem  120 mg Oral Daily  . donepezil  5 mg Oral QHS  . ferrous gluconate  324 mg Oral TID WC  . gabapentin  100 mg Oral TID  . levothyroxine  25 mcg Oral QAC breakfast  . lisinopril  5 mg Oral Daily  . [START ON 10/11/2019] pantoprazole  40 mg Intravenous Q12H  . sertraline  100 mg Oral Daily  . thiamine  100 mg Oral Daily  . zinc sulfate  220 mg Oral Daily   Continuous Infusions: . cefTRIAXone (ROCEPHIN)  IV 2 g (10/09/19 2219)  . lactated ringers 100 mL/hr at 10/10/19 1115  . pantoprozole (PROTONIX) infusion 8 mg/hr (10/10/19 0453)  . vancomycin       LOS: 3 days        Kathlen Mody, MD [facility]   To contact the attending provider between 7A-7P or the covering provider during after hours 7P-7A, please log into the web site [email or web address] and access using universal [place] password for that web site. If you do not have the password, please call the hospital operator.  10/10/2019, 2:36 PM

## 2019-10-11 LAB — CBC WITH DIFFERENTIAL/PLATELET
Abs Immature Granulocytes: 0.09 10*3/uL — ABNORMAL HIGH (ref 0.00–0.07)
Basophils Absolute: 0.1 10*3/uL (ref 0.0–0.1)
Basophils Relative: 1 %
Eosinophils Absolute: 0.7 10*3/uL — ABNORMAL HIGH (ref 0.0–0.5)
Eosinophils Relative: 5 %
HCT: 31.7 % — ABNORMAL LOW (ref 36.0–46.0)
Hemoglobin: 9.4 g/dL — ABNORMAL LOW (ref 12.0–15.0)
Immature Granulocytes: 1 %
Lymphocytes Relative: 16 %
Lymphs Abs: 2.2 10*3/uL (ref 0.7–4.0)
MCH: 24.4 pg — ABNORMAL LOW (ref 26.0–34.0)
MCHC: 29.7 g/dL — ABNORMAL LOW (ref 30.0–36.0)
MCV: 82.1 fL (ref 80.0–100.0)
Monocytes Absolute: 1 10*3/uL (ref 0.1–1.0)
Monocytes Relative: 7 %
Neutro Abs: 9.8 10*3/uL — ABNORMAL HIGH (ref 1.7–7.7)
Neutrophils Relative %: 70 %
Platelets: 664 10*3/uL — ABNORMAL HIGH (ref 150–400)
RBC: 3.86 MIL/uL — ABNORMAL LOW (ref 3.87–5.11)
RDW: 17.3 % — ABNORMAL HIGH (ref 11.5–15.5)
WBC: 13.8 10*3/uL — ABNORMAL HIGH (ref 4.0–10.5)
nRBC: 0 % (ref 0.0–0.2)

## 2019-10-11 LAB — PHOSPHORUS: Phosphorus: 2.6 mg/dL (ref 2.5–4.6)

## 2019-10-11 LAB — BASIC METABOLIC PANEL
Anion gap: 8 (ref 5–15)
BUN: 5 mg/dL — ABNORMAL LOW (ref 8–23)
CO2: 27 mmol/L (ref 22–32)
Calcium: 8.1 mg/dL — ABNORMAL LOW (ref 8.9–10.3)
Chloride: 103 mmol/L (ref 98–111)
Creatinine, Ser: 0.51 mg/dL (ref 0.44–1.00)
GFR calc Af Amer: >60 mL/min (ref >60)
GFR calc non Af Amer: >60 mL/min (ref >60)
Glucose, Bld: 82 mg/dL (ref 70–99)
Potassium: 3.6 mmol/L (ref 3.5–5.1)
Sodium: 138 mmol/L (ref 135–145)

## 2019-10-11 LAB — MAGNESIUM: Magnesium: 2 mg/dL (ref 1.7–2.4)

## 2019-10-11 MED ORDER — SODIUM CHLORIDE 0.9 % IV SOLN: INTRAVENOUS | Status: DC

## 2019-10-11 NOTE — Evaluation (Signed)
Physical Therapy Evaluation Patient Details Name: Amber Bean MRN: [id number] DOB: April 23, 1940 Today's Date: 10/11/2019   History of Present Illness  79 year old lady with prior history of dementia, UTI, COVID-19 pneumonia infection in June 2021, diabetes, iron deficiency anemia, coronary artery disease, hypertension, sacral decubitus ulcer was brought in from SNF for worsening decubitus ulcer wound and generalized weakness and poor oral intake.  Clinical Impression  Patient presents with max to total A for mobility.  Unknown baseline as from SNF, but feel further skilled PT needs can be met in SNF level of care.  She did report she does not get OOB, but could not confirm as no family present.  Recommend nursing perform ROM activities when mobilizing in bed and performing daily care.  PT will sign off.    Follow Up Recommendations SNF    Equipment Recommendations  None recommended by PT    Recommendations for Other Services       Precautions / Restrictions Precautions Precautions: Fall Precaution Comments: dementia, sacral decubitus, nectar liquids, D3 diet      Mobility  Bed Mobility Overal bed mobility: Needs Assistance Bed Mobility: Rolling;Supine to Sit;Sit to Supine Rolling: Mod assist;+2 for physical assistance   Supine to sit: Max assist;HOB elevated Sit to supine: Max assist   General bed mobility comments: assist for legs off bed and to lift trunk, pt assisting with rolling some as knees flexed, to supine assist for legs and trunk and RN in to assist to scoot up to [facility]; positioned in R sidelying  Transfers                 General transfer comment: unable  Ambulation/Gait                Stairs            Wheelchair Mobility    Modified Rankin (Stroke Patients Only)       Balance Overall balance assessment: Needs assistance Sitting-balance support: Feet unsupported Sitting balance-Leahy Scale: Poor Sitting balance - Comments: min  to minguard A at EOB mild L lateral lean, some anterior bias with flexed posture, work to come up to tall sitting to look out window                                     Pertinent Vitals/Pain Pain Assessment: Faces Faces Pain Scale: Hurts little more Pain Location: R ankle with AROM Pain Descriptors / Indicators: Guarding;Grimacing Pain Intervention(s): Monitored during session;Repositioned;Limited activity within patient's tolerance    Home Living Family/patient expects to be discharged to:: Skilled nursing facility                      Prior Function           Comments: prior level of function unknown     Hand Dominance        Extremity/Trunk Assessment   Upper Extremity Assessment Upper Extremity Assessment: RUE deficits/detail;LUE deficits/detail RUE Deficits / Details: AAROM WFL except shoulder flexion limited about 90 and some stiffness in elbow extgension, strength elbow flex/ext 4-/5, shoulder elevation only with assist and pt reported it felt good LUE Deficits / Details: AAROM WFL except shoulder flexion limited about 90 and some stiffness in elbow extension strength elbow flex/ext 4-/5, shoulder elevation only with assist and pt reported it felt good    Lower Extremity Assessment Lower Extremity Assessment: RLE deficits/detail;LLE deficits/detail  RLE Deficits / Details: AAROM limited hip extension, knee extension and ankle DF bilaterally (fetal position), demonstrates active flexion, but limited active extension at knees, painful with R ankle DF, but no errythema or edema noted LLE Deficits / Details: AAROM limited hip extension, knee extension and ankle DF bilaterally (fetal position), demonstrates active flexion, but limited active extension at knees    Cervical / Trunk Assessment Cervical / Trunk Assessment: Kyphotic;Other exceptions Cervical / Trunk Exceptions: scoliotic  Communication      Cognition Arousal/Alertness:  Awake/alert Behavior During Therapy: Flat affect Overall Cognitive Status: No family/caregiver present to determine baseline cognitive functioning                                 General Comments: not oriented to place, time or situation, states her name and that she is from [place]; follows one step commands intermittently, slow to respond to questions most repeated x 2-3 prior to response.      General Comments General comments (skin integrity, edema, etc.): sacral wound, heels look fine    Exercises Other Exercises Other Exercises: AAROM/PROM shoulder flexion, elbow extension, knee/hip flex/ext and ankle DF as tolerated   Assessment/Plan    PT Assessment All further PT needs can be met in the next venue of care  PT Problem List Decreased mobility;Decreased balance;Decreased strength       PT Treatment Interventions      PT Goals (Current goals can be found in the Care Plan section)  Acute Rehab PT Goals Patient Stated Goal: none stated PT Goal Formulation: All assessment and education complete, DC therapy    Frequency     Barriers to discharge        Co-evaluation               AM-PAC PT "6 Clicks" Mobility  Outcome Measure Help needed turning from your back to your side while in a flat bed without using bedrails?: A Lot Help needed moving from lying on your back to sitting on the side of a flat bed without using bedrails?: Total Help needed moving to and from a bed to a chair (including a wheelchair)?: Total Help needed standing up from a chair using your arms (e.g., wheelchair or bedside chair)?: Total Help needed to walk in hospital room?: Total Help needed climbing 3-5 steps with a railing? : Total 6 Click Score: 7    End of Session   Activity Tolerance: Patient tolerated treatment well Patient left: in bed;with call bell/phone within reach   PT Visit Diagnosis: Other abnormalities of gait and mobility (R26.89);Muscle weakness  (generalized) (M62.81)    Time: 0388-8280 PT Time Calculation (min) (ACUTE ONLY): 21 min   Charges:   PT Evaluation $PT Eval Moderate Complexity: 1 Mod          Cyndi Mace Weinberg, PT Acute Rehabilitation Services [id number]:[phone] Office:[phone] 10/11/2019   Reginia Naas 10/11/2019, 2:26 PM

## 2019-10-11 NOTE — Progress Notes (Signed)
   10/11/19 1644  Clinical Encounter Type  Visited With Patient and family together  Visit Type Follow-up;Spiritual support  Referral From Palliative care team  Consult/Referral To Chaplain  This chaplain checked in with Pt. RN-Ben and NP-MM before attempting F/U spiritual care phone call.  The Pt. Daughter-Anne answered the phone and invited the chaplain to meet her in the Pt. room.  The chaplain observed the Pt. awake and responding to Anne's questions with short answers.  The chaplain facilitated Anne's introduction to RN.  The Pt. clearly stated to her daughter she wanted to go home.  Anne's medical questions to the chaplain often centered on the goal of going home.  The chaplain encouraged and directed the questions about the next procedure and the necessary equipment was directed towards the RN and CM. 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 need for a mitten.  Both the Pt. and Thurston Hole appeared comfortable with each other's presence.  An invitation for F/U spiritual care was accepted by Thurston Hole.

## 2019-10-11 NOTE — Progress Notes (Signed)
PROGRESS NOTE    CARTINA BROUSSEAU  ZOX:[id number] DOB: 1940-02-28 DOA: 10/07/2019 PCP: Veverly Fells, MD    Chief Complaint  Patient presents with  . sacral wound    Brief Narrative:  79 year old lady with prior history of dementia, UTI, COVID-19 pneumonia infection in June 2021, diabetes, iron deficiency anemia, coronary artery disease, hypertension, sacral decubitus ulcer was brought in from SNF for worsening decubitus ulcer wound and generalized weakness and poor oral intake. CT of the abdomen and pelvis shows evidence of sacral decubitus ulcers with multiple air collection within the subcutaneous fat of midline just deep to the ulceration, suspected subcutaneous abscess.  There is circumferential wall thickening over the rectum probably proctitis.  Patient admitted for sepsis secondary to infected sacral decubitus ulcer. General surgery consulted and recommended hydrotherapy initially and if that does not work, plan for debridement.  GI consulted for guaiac positive stools. She underwent EGD , revealing gastritis and a few duodenal AVM'S. No indication for colonoscopy. SLP evaluation ordered to initiate her diet. She underwent MBS and was started on dysphagia 1 diet with pureed solids and nectar thick liquid.  She is currently undergoing hydrotherapy for her stage 4 sacral decubitus ulcer. Meanwhile in view of her clinical deterioration, advanced dementia, poor progression, poor oral intake, palliative care consulted for goals of care discussions.  Pt seen and examined at bedside. No new complaints.   Assessment & Plan:   Principal Problem:   Sepsis (HCC) Active Problems:   Hypothyroidism   T2DM (type 2 diabetes mellitus) (HCC)   ANEMIA-IRON DEFICIENCY   CAD (coronary artery disease)   Mitral valve disease   Coronary artery disease involving native coronary artery of native heart without angina pectoris   Essential hypertension   Dyslipidemia   Sacral decubitus ulcer, stage IV  (HCC)   Palliative care by specialist   Goals of care, counseling/discussion   DNR (do not resuscitate)  Sepsis secondary to infected sacral decubitus ulcer:  Improving , BP parameters have improved, improving wbc count, . Lactate normalized.  Pro calcitonin is 0.10.  She was started on broad-spectrum IV antibiotics, continue the same for now.  General surgery consulted for possible debridement of the sacral decubitus ulcer.  General surgery recommended hydrotherapy at this time and no debridement at this time.  Day 5 of antibiotics. Target atleast 10 days treatment.    Questionable GI bleed heme positive stools/microcytic anemia Patient's daughter reports history of melanotic stools. Patient's baseline hemoglobin is around 9 dropped to a hemoglobin of 7.6  on admission. S/p 1 unit of PRBC transfusion. repeat hemoglobin is around 9  GI consulted and underwent EGD showing gastritis and a few duodenal AVM's  Which were treated. No indication for colonoscopy .  Continue with PPI.   Dementia:  Resume aricept.    Hypokalemia and hypo magnesia:  Replaced, repeat in am wnl.   History of coronary artery disease No chest pain at this time.   Essential hypertension Well controlled.     Type 2 diabetes mellitus  hemoglobin A1c is 5.4   CBG (last 3)  No results for input(s): GLUCAP in the last 72 hours.   Hypothyroidism Continue with Synthroid 25 MCG daily.    Pressure injury present on admission. Pressure Injury 10/07/19 Coccyx Right;Left;Medial Unstageable - Full thickness tissue loss in which the base of the injury is covered by slough (yellow, tan, gray, green or brown) and/or eschar (tan, brown or black) in the wound bed. (Active)  10/07/19 2300  Location: Coccyx  Location Orientation: Right;Left;Medial  Staging: Unstageable - Full thickness tissue loss in which the base of the injury is covered by slough (yellow, tan, gray, green or brown) and/or eschar (tan, brown or  black) in the wound bed.  Wound Description (Comments):   Present on Admission: Yes   gen surgery on board and planning for hydro therapy.     Thrombocytosis:  Probably from the infection and anemia.  Continue to monitor. Improving.    DVT prophylaxis: SCD'S Code Status: DNR  Family Communication: none at bedside. Discussed with daughter over the phone.  Disposition:   Status is: Inpatient  Remains inpatient appropriate because:Ongoing diagnostic testing needed not appropriate for outpatient work up, IV treatments appropriate due to intensity of illness or inability to take PO and Inpatient level of care appropriate due to severity of illness   Dispo: The patient is from: SNF              Anticipated d/c is to: pending.               Anticipated d/c date is: > 3 days              Patient currently is not medically stable to d/c.       Consultants:   GASTROENTEROLOGY  General surgery.   Palliative care  Procedures: none.  Antimicrobials: (  Anti-infectives (From admission, onward)   Start     Dose/Rate Route Frequency Ordered Stop   10/10/19 1900  vancomycin (VANCOREADY) IVPB 1250 mg/250 mL  Status:  Discontinued        1,250 mg 166.7 mL/hr over 90 Minutes Intravenous Every 12 hours 10/10/19 0815 10/10/19 0819   10/10/19 1900  vancomycin (VANCOCIN) IVPB 1000 mg/200 mL premix     Discontinue     1,000 mg 200 mL/hr over 60 Minutes Intravenous Every 12 hours 10/10/19 0819     10/08/19 0500  vancomycin (VANCOREADY) IVPB 750 mg/150 mL  Status:  Discontinued        750 mg 150 mL/hr over 60 Minutes Intravenous Every 12 hours 10/07/19 1952 10/10/19 0815   10/07/19 2230  piperacillin-tazobactam (ZOSYN) IVPB 3.375 g  Status:  Discontinued        3.375 g 12.5 mL/hr over 240 Minutes Intravenous Every 8 hours 10/07/19 1952 10/07/19 2127   10/07/19 2200  cefTRIAXone (ROCEPHIN) 2 g in sodium chloride 0.9 % 100 mL IVPB     Discontinue     2 g 200 mL/hr over 30 Minutes  Intravenous Every 24 hours 10/07/19 2037     10/07/19 2045  vancomycin (VANCOCIN) IVPB 1000 mg/200 mL premix  Status:  Discontinued        1,000 mg 200 mL/hr over 60 Minutes Intravenous  Once 10/07/19 2037 10/07/19 2055   10/07/19 1615  piperacillin-tazobactam (ZOSYN) IVPB 3.375 g        3.375 g 100 mL/hr over 30 Minutes Intravenous  Once 10/07/19 1610 10/07/19 1710   10/07/19 1615  vancomycin (VANCOREADY) IVPB 1500 mg/300 mL        1,500 mg 150 mL/hr over 120 Minutes Intravenous  Once 10/07/19 1610 10/07/19 1845       Subjective: Appears comfortable, following some commands.   Objective: Vitals:   10/10/19 2247 10/11/19 0457 10/11/19 0737 10/11/19 1300  BP: (!) 159/67 (!) 155/57 (!) 158/54 (!) 155/63  Pulse: 77 72 67 (!) 59  Resp: 16 17    Temp: 98.1 F (36.7 C) 98.2 F (36.8  C) 98.4 F (36.9 C) 97.7 F (36.5 C)  TempSrc: Oral Oral Oral Oral  SpO2: 93% 93% 94% 95%  Weight:      Height:        Intake/Output Summary (Last 24 hours) at 10/11/2019 1639 Last data filed at 10/11/2019 1500 Gross per 24 hour  Intake 1922.09 ml  Output 2200 ml  Net -277.91 ml   Filed Weights   10/07/19 2100  Weight: 68 kg    Examination:  General exam: Frail elderly lady not in distress.  Respiratory system: air entry fair , no wheezing or rhonchi.  Cardiovascular system: S1S2 heard, RRR, no JVD, no pedal edema.  Gastrointestinal system: Abdomen is soft non tender non distended, bs+ Central nervous system: alert and follows some commands.  Extremities: No cyanosis.  Skin: unstageable right coccygeal ulcer on the right.  Psychiatry: Cannot be assessed    Data Reviewed: I have personally reviewed following labs and imaging studies  CBC: Recent Labs  Lab 10/07/19 1507 10/07/19 1507 10/07/19 2157 10/08/19 0036 10/09/19 0145 10/10/19 0437 10/11/19 0542  WBC 14.6*   < > 20.3* 20.6* 11.7* 11.4* 13.8*  NEUTROABS 10.9*  --   --   --  7.9* 8.1* 9.8*  HGB 7.6*   < > 7.6* 7.5* 7.9*  8.0* 9.4*  HCT 27.5*   < > 27.1* 26.4* 26.2* 27.7* 31.7*  MCV 81.1   < > 81.6 80.0 81.9 82.2 82.1  PLT 837*   < > 775* 763* 702* 655* 664*   < > = values in this interval not displayed.    Basic Metabolic Panel: Recent Labs  Lab 10/07/19 1507 10/07/19 1507 10/07/19 2157 10/08/19 0036 10/09/19 0145 10/10/19 0437 10/11/19 0542  NA 139  --   --  138 140 141 138  K 3.7  --   --  4.3 3.1* 3.1* 3.6  CL 103  --   --  103 104 106 103  CO2 26  --   --  GLUCOSE 122*  --   --  105* 83 77 82  BUN 12  --   --  11 8 <5* 5*  CREATININE 0.60   < > 0.45 0.54 0.45 0.47 0.51  CALCIUM 8.9  --   --  8.7* 8.6* 8.6* 8.1*  MG  --   --   --   --   --  1.5* 2.0  PHOS  --   --   --   --   --   --  2.6   < > = values in this interval not displayed.    GFR: Estimated Creatinine Clearance: 57.3 mL/min (by C-G formula based on SCr of 0.51 mg/dL).  Liver Function Tests: Recent Labs  Lab 10/07/19 1507 10/08/19 0036 10/09/19 0145  AST ALT 32 31 22  ALKPHOS 85 82 74  BILITOT 0.4 0.5 0.5  PROT 5.9* 5.7* 5.0*  ALBUMIN 2.0* 2.0* 1.8*    CBG: Recent Labs  Lab 10/07/19 1441 10/08/19 0905 10/08/19 1108 10/08/19 1614  GLUCAP 123* 90 91 84     Recent Results (from the past 240 hour(s))  Blood Culture (routine x 2)     Status: None (Preliminary result)   Collection Time: 10/07/19  4:06 PM   Specimen: BLOOD RIGHT FOREARM  Result Value Ref Range Status   Specimen Description BLOOD RIGHT FOREARM  Final   Special Requests   Final    BOTTLES DRAWN AEROBIC  AND ANAEROBIC Blood Culture results may not be optimal due to an inadequate volume of blood received in culture bottles   Culture   Final    NO GROWTH 4 DAYS Performed at [facility], [address] [place], [place] [address]    Report Status PENDING  Incomplete  Blood Culture (routine x 2)     Status: None (Preliminary result)   Collection Time: 10/07/19  4:11 PM   Specimen: BLOOD RIGHT HAND  Result Value Ref  Range Status   Specimen Description BLOOD RIGHT HAND  Final   Special Requests   Final    BOTTLES DRAWN AEROBIC AND ANAEROBIC Blood Culture adequate volume   Culture   Final    NO GROWTH 4 DAYS Performed at [facility], [address] [place], [place] [address]    Report Status PENDING  Incomplete  SARS Coronavirus 2 by RT PCR (hospital order, performed in [facility] hospital lab) Nasopharyngeal Nasopharyngeal Swab     Status: None   Collection Time: 10/07/19  4:31 PM   Specimen: Nasopharyngeal Swab  Result Value Ref Range Status   SARS Coronavirus 2 NEGATIVE NEGATIVE Final    Comment: (NOTE) SARS-CoV-2 target nucleic acids are NOT DETECTED.  The SARS-CoV-2 RNA is generally detectable in upper and lower respiratory specimens during the acute phase of infection. The lowest concentration of SARS-CoV-2 viral copies this assay can detect is 250 copies / mL. A negative result does not preclude SARS-CoV-2 infection and should not be used as the sole basis for treatment or other patient management decisions.  A negative result may occur with improper specimen collection / handling, submission of specimen other than nasopharyngeal swab, presence of viral mutation(s) within the areas targeted by this assay, and inadequate number of viral copies (<250 copies / mL). A negative result must be combined with clinical observations, patient history, and epidemiological information.  Fact Sheet for Patients:   BoilerBrush.com.cyhttps://www.fda.gov/media/136312/download  Fact Sheet for Healthcare Providers: https://pope.com/https://www.fda.gov/media/136313/download  This test is not yet approved or  cleared by the [place] FDA and has been authorized for detection and/or diagnosis of SARS-CoV-2 by FDA under an Emergency Use Authorization (EUA).  This EUA will remain in effect (meaning this test can be used) for the duration of the COVID-19 declaration under Section 564(b)(1) of the Act, 21 U.S.C. section  360bbb-3(b)(1), unless the authorization is terminated or revoked sooner.  Performed at [facility], [address] [place], [place] [address]          Radiology Studies: DG Swallowing Func-Speech Pathology  Result Date: 10/10/2019 Objective Swallowing Evaluation: Type of Study: MBS-Modified Barium Swallow Study  Patient Details Name: Janalyn ShyVirginia E Breth MRN: [id number] Date of Birth: Jul 19, 1940 Today's Date: 10/10/2019 Time: SLP Start Time (ACUTE ONLY): 1320 -SLP Stop Time (ACUTE ONLY): 1338 SLP Time Calculation (min) (ACUTE ONLY): 18 min Past Medical History: Past Medical History: Diagnosis Date . Anemia, iron deficiency  . Anemia, vitamin B12 deficiency  . BACK PAIN, CHRONIC  . CAD (coronary artery disease)   S/P CABG in 1995. Last cath in 2003 demonstrated left main 20% stenosis, LAD 50-70% mid stenosis, circumflex 40-50% stenosis in the AV groove, 30% right coronary artery stenosis. Saphenous vein graft to the marginal was patent. A LIMA to the LAD was patent. Marland Kitchen. CAROTID ARTERY STENOSIS  . Diabetes mellitus   Diet controlled since weight loss . GERD (gastroesophageal reflux disease)  . Hypothyroidism  . Mitral valve disorders(424.0)  . Obstructive  sleep apnea on CPAP   intol of CPAP - noncompliant . Osteopenia  . Pancreatitis  . Pseudoaneurysm [facility])   Requiring repair following last cath . Tobacco abuse   Quit 02/2010 Past Surgical History: Past Surgical History: Procedure Laterality Date . ABDOMINAL HYSTERECTOMY   . APPENDECTOMY   . BIOPSY  10/09/2019  Procedure: BIOPSY;  Surgeon: Meridee Score Netty Starring., MD;  Location: [facility] ENDOSCOPY;  Service: Gastroenterology;; . BREAST LUMPECTOMY   . CARDIAC CATHETERIZATION  2003 . CORONARY ARTERY BYPASS GRAFT  1995  [place], [place] . ESOPHAGOGASTRODUODENOSCOPY N/A 10/09/2019  Procedure: ESOPHAGOGASTRODUODENOSCOPY (EGD);  Surgeon: Lemar Lofty., MD;  Location: [facility] ENDOSCOPY;  Service: Gastroenterology;  Laterality: N/A; . HEMOSTASIS CLIP PLACEMENT   10/09/2019  Procedure: HEMOSTASIS CLIP PLACEMENT;  Surgeon: Lemar Lofty., MD;  Location: [facility] ENDOSCOPY;  Service: Gastroenterology;; . HOT HEMOSTASIS N/A 10/09/2019  Procedure: HOT HEMOSTASIS (ARGON PLASMA COAGULATION/BICAP);  Surgeon: Lemar Lofty., MD;  Location: [facility] ENDOSCOPY;  Service: Gastroenterology;  Laterality: N/A; . LUMBAR FUSION   . SAPHENOUS VEIN GRAFT RESECTION  2003 . TONSILLECTOMY   HPI: Pt is a 79 yo female with recent hospitalization [facility]) in June for UTI/colitis/COVID. She is admitted from SNF with sepsis from sacral ulcer. GI also consulted for possible GIB. MBS was completed at [facility] in June 2021 revealing cord level penetration with thin/nectar thick liquids. FEES repeated several days later showed improvements including adequate pharyngeal function but reduced oral control and coordination. Dys 2 (chopped) diet and thin liquids recommended. PMH includes: dementia, DM, previous tobacco use, anemia, CAD, HTN, GERD  Subjective: alert, minimally communicative Assessment / Plan / Recommendation CHL IP CLINICAL IMPRESSIONS 10/10/2019 Clinical Impression Pt presents with oropharyngeal dysphagia characterized by impaired mastication, reduced lingual retraction, and a pharyngeal delay. Mastication was significantly prolonged and mechanical soft (chopped) solids were ultimately spat out by pt due to her difficulty with mastication. She demonstrated vallecular residue with solids and penetration (PAS 3) of thin liquids which was inconsistently eliminated with prompted use of smaller boluses; however, she exhibited difficulty consistently utilizing this strategy. Pt demonstrated throat clearing between swallows of thin liquids, and aspiration of penetrated material is therefore suspected. No instances of penetration/aspiration were noted with nectar thick liquids. A cricopharyngeal bar was noted during deglutition of liquids but esophageal phase was [facility]. A dysphagia 1 (puree) diet with  nectar thick liquids is recommended at this time. SLP will continue to follow pt and prognosis for advancement is judged to be good if her mentation improves further.  SLP Visit Diagnosis Dysphagia, oropharyngeal phase (R13.12) Attention and concentration deficit following -- Frontal lobe and executive function deficit following -- Impact on safety and function Moderate aspiration risk   CHL IP TREATMENT RECOMMENDATION 10/10/2019 Treatment Recommendations Therapy as outlined in treatment plan below   Prognosis 10/10/2019 Prognosis for Safe Diet Advancement Good Barriers to Reach Goals Cognitive deficits Barriers/Prognosis Comment -- CHL IP DIET RECOMMENDATION 10/10/2019 SLP Diet Recommendations Dysphagia 1 (Puree) solids;Nectar thick liquid Liquid Administration via Cup;Straw Medication Administration Whole meds with liquid Compensations Slow rate;Small sips/bites Postural Changes Seated upright at 90 degrees   CHL IP OTHER RECOMMENDATIONS 10/10/2019 Recommended Consults -- Oral Care Recommendations Oral care BID Other Recommendations --   CHL IP FOLLOW UP RECOMMENDATIONS 10/10/2019 Follow up Recommendations Skilled Nursing facility   [facility] IP FREQUENCY AND DURATION 10/10/2019 Speech Therapy Frequency (ACUTE ONLY) min 2x/week Treatment Duration 2 weeks      CHL IP ORAL PHASE 10/10/2019 Oral Phase Impaired Oral - Pudding Teaspoon -- Oral -  Pudding Cup -- Oral - Honey Teaspoon -- Oral - Honey Cup -- Oral - Nectar Teaspoon -- Oral - Nectar Cup -- Oral - Nectar Straw WFL Oral - Thin Teaspoon -- Oral - Thin Cup WFL Oral - Thin Straw WFL Oral - Puree WFL Oral - Mech Soft Impaired mastication Oral - Regular Impaired mastication Oral - Multi-Consistency -- Oral - Pill -- Oral Phase - Comment --  CHL IP PHARYNGEAL PHASE 10/10/2019 Pharyngeal Phase Impaired Pharyngeal- Pudding Teaspoon -- Pharyngeal -- Pharyngeal- Pudding Cup -- Pharyngeal -- Pharyngeal- Honey Teaspoon -- Pharyngeal -- Pharyngeal- Honey Cup -- Pharyngeal -- Pharyngeal-  Nectar Teaspoon -- Pharyngeal -- Pharyngeal- Nectar Cup -- Pharyngeal -- Pharyngeal- Nectar Straw Delayed swallow initiation-pyriform sinuses;Delayed swallow initiation-vallecula Pharyngeal -- Pharyngeal- Thin Teaspoon -- Pharyngeal -- Pharyngeal- Thin Cup -- Pharyngeal -- Pharyngeal- Thin Straw Delayed swallow initiation-pyriform sinuses;Delayed swallow initiation-vallecula Pharyngeal -- Pharyngeal- Puree Delayed swallow initiation-pyriform sinuses;Delayed swallow initiation-vallecula;Pharyngeal residue - valleculae;Reduced tongue base retraction Pharyngeal -- Pharyngeal- Mechanical Soft Delayed swallow initiation-pyriform sinuses;Delayed swallow initiation-vallecula;Pharyngeal residue - valleculae;Reduced tongue base retraction Pharyngeal -- Pharyngeal- Regular Delayed swallow initiation-pyriform sinuses;Delayed swallow initiation-vallecula;Pharyngeal residue - valleculae;Reduced tongue base retraction Pharyngeal -- Pharyngeal- Multi-consistency -- Pharyngeal -- Pharyngeal- Pill -- Pharyngeal -- Pharyngeal Comment --  CHL IP CERVICAL ESOPHAGEAL PHASE 10/10/2019 Cervical Esophageal Phase (No Data) Pudding Teaspoon -- Pudding Cup -- Honey Teaspoon -- Honey Cup -- Nectar Teaspoon -- Nectar Cup -- Nectar Straw -- Thin Teaspoon -- Thin Cup -- Thin Straw -- Puree -- Mechanical Soft -- Regular -- Multi-consistency -- Pill -- Cervical Esophageal Comment -- Shanika I. Vear Clock, MS, CCC-SLP Acute Rehabilitation Services Office number [phone] Pager [phone] Scheryl Marten 10/10/2019, 2:36 PM                   Scheduled Meds: . amitriptyline  150 mg Oral QHS  . ascorbic acid  500 mg Oral Daily  . atenolol  50 mg Oral Daily  . Chlorhexidine Gluconate Cloth  6 each Topical Daily  . collagenase   Topical BID  . diltiazem  120 mg Oral Daily  . donepezil  5 mg Oral QHS  . ferrous gluconate  324 mg Oral TID WC  . gabapentin  100 mg Oral TID  . levothyroxine  25 mcg Oral QAC breakfast  . lisinopril  5 mg  Oral Daily  . pantoprazole  40 mg Oral BID  . sertraline  100 mg Oral Daily  . thiamine  100 mg Oral Daily  . zinc sulfate  220 mg Oral Daily   Continuous Infusions: . cefTRIAXone (ROCEPHIN)  IV Stopped (10/10/19 2234)  . lactated ringers 100 mL/hr at 10/11/19 0856  . vancomycin Stopped (10/11/19 0900)     LOS: 4 days        Kathlen Mody, MD [facility]   To contact the attending provider between 7A-7P or the covering provider during after hours 7P-7A, please log into the web site [email or web address] and access using universal [place] password for that web site. If you do not have the password, please call the hospital operator.  10/11/2019, 4:39 PM

## 2019-10-11 NOTE — Progress Notes (Signed)
Daily Progress Note   Patient Name: Amber Bean       Date: 10/11/2019 DOB: [birth year]  Age: 79 y.o. MRN#: [id number] Attending Physician: Kathlen Mody, MD Primary Care Physician: Veverly Fells, MD Admit Date: 10/07/2019  Reason for Consultation/Follow-up: Establishing goals of care  Subjective: Patient awake and alert. Denies pain and does not appear to be in pain or discomfort. Disoriented with baseline dementia.  Discussed with RN. Tolerated pills this AM with applesauce. Poor oral intake for breakfast.  Discussed with Dr. Blake Divine. No family at bedside. Unable to reach daughter this AM.  Length of Stay: 4  Current Medications: Scheduled Meds:  . amitriptyline  150 mg Oral QHS  . ascorbic acid  500 mg Oral Daily  . atenolol  50 mg Oral Daily  . Chlorhexidine Gluconate Cloth  6 each Topical Daily  . collagenase   Topical BID  . diltiazem  120 mg Oral Daily  . donepezil  5 mg Oral QHS  . ferrous gluconate  324 mg Oral TID WC  . gabapentin  100 mg Oral TID  . levothyroxine  25 mcg Oral QAC breakfast  . lisinopril  5 mg Oral Daily  . pantoprazole  40 mg Oral BID  . sertraline  100 mg Oral Daily  . thiamine  100 mg Oral Daily  . zinc sulfate  220 mg Oral Daily    Continuous Infusions: . cefTRIAXone (ROCEPHIN)  IV Stopped (10/10/19 2234)  . lactated ringers 100 mL/hr at 10/11/19 0856  . vancomycin 1,000 mg (10/11/19 0620)    PRN Meds: acetaminophen, ketorolac, nitroGLYCERIN, ondansetron **OR** ondansetron (ZOFRAN) IV, Resource ThickenUp Clear  Physical Exam Vitals and nursing note reviewed.  Constitutional:      Appearance: She is cachectic. She is ill-appearing.  HENT:     Head: Normocephalic and atraumatic.  Cardiovascular:     Heart sounds: Normal heart  sounds.  Pulmonary:     Effort: No tachypnea, accessory muscle usage or respiratory distress.     Breath sounds: Normal breath sounds.  Skin:    General: Skin is warm and dry.  Neurological:     Mental Status: She is easily aroused.     Comments: Awake, alert, disoriented with baseline dementia  Psychiatric:        Attention and Perception: She is inattentive.  Cognition and Memory: Cognition is impaired.            Vital Signs: BP (!) 158/54 (BP Location: Left Arm)   Pulse 67   Temp 98.4 F (36.9 C) (Oral)   Resp 17   Ht 5\' 7"  (1.702 m)   Wt 68 kg Comment: estimated  SpO2 94%   BMI 23.48 kg/m  SpO2: SpO2: 94 % O2 Device: O2 Device: Room Air O2 Flow Rate: O2 Flow Rate (L/min): 2 L/min  Intake/output summary:   Intake/Output Summary (Last 24 hours) at 10/11/2019 1106 Last data filed at 10/11/2019 0900 Gross per 24 hour  Intake 4481.44 ml  Output 3300 ml  Net 1181.44 ml   LBM: Last BM Date: 10/09/19 Baseline Weight: Weight: 68 kg (estimated) Most recent weight: Weight: 68 kg (estimated)       Palliative Assessment/Data: PPS 30%      Patient Active Problem List   Diagnosis Date Noted  . Palliative care by specialist   . Goals of care, counseling/discussion   . DNR (do not resuscitate)   . Sacral decubitus ulcer, stage IV (HCC) 10/08/2019  . Sepsis (HCC) 10/07/2019  . Coronary artery disease involving native coronary artery of native heart without angina pectoris 07/13/2018  . Essential hypertension 07/13/2018  . Dyslipidemia 07/13/2018  . Edema 07/13/2018  . Educated about COVID-19 virus infection 07/13/2018  . Hyperlipidemia 08/06/2010  . DYSPHAGIA UNSPECIFIED 04/03/2010  . ANEMIA-IRON DEFICIENCY 10/07/2009  . GERD 10/07/2009  . KIDNEY DISEASE 10/07/2009  . Chronic back pain 10/07/2009  . TOBACCO ABUSE 02/27/2009  . Mitral valve disease 02/27/2009  . CAROTID ARTERY STENOSIS 02/27/2009  . Hypothyroidism 12/31/2008  . T2DM (type 2 diabetes  mellitus) (HCC) 12/31/2008  . CAD (coronary artery disease) 12/31/2008  . PANCREATITIS 12/31/2008  . SLEEP APNEA 12/31/2008    Palliative Care Assessment & Plan   Patient Profile: Per intake H&P --> 13/09/2008 E Neelyis a 79 y.o.femalewith medical history significant ofdementia, UTI, COVID-19 pneumonia in June, diabetes, previous tobacco abuse, iron deficiency anemia coronary artery disease, hypertension, [facility] Patient has been there for the time until the daughter went to visit today and found her in the bad shape. Her mother was weak debilitated and deep sacral decubitus ulcer. She noticed that they have been changing the dressing but also has been having darker stools with suspected GI bleed. She is weak debilitated and worse this year last saw her. She brought her to the ER where she was seen and evaluated. Patient found to be septic by criteria with a deep sacral decubitus ulcer stage IV as well as guaiac positive stool and anemia.  Palliative care was asked to address goals of care in the setting of GIB and sacral wound.   Assessment: Sepsis Infected sacral decubitus ulcer Dementia GI bleeding T2DM  Recommendations/Plan: Continue current plan of care and medical management. MOST form completed. DNR/DNI, no feeding tube. Ongoing palliative discussions. Will continue attempts to reach daughter, July.    Code Status: DNR/DNI   Code Status Orders  (From admission, onward)         Start     Ordered   10/08/19 1458  Do not attempt resuscitation (DNR)  Continuous       Question Answer Comment  In the event of cardiac or respiratory ARREST Do not call a "code blue"   In the event of cardiac or respiratory ARREST Do not perform Intubation, CPR,  defibrillation or ACLS   In the event of cardiac or respiratory ARREST Use medication by any route, position, wound care, and  other measures to relive pain and suffering. May use oxygen, suction and manual treatment of airway obstruction as needed for comfort.      10/08/19 1457        Code Status History    Date Active Date Inactive Code Status Order ID Comments User Context   10/07/2019 2038 10/08/2019 1457 Full Code [id number]  Rometta Emery, MD Inpatient   10/07/2019 2038 10/07/2019 2038 Full Code [id number]  Rometta Emery, MD Inpatient   Advance Care Planning Activity    Advance Directive Documentation     Most Recent Value  Type of Advance Directive Healthcare Power of Attorney  Pre-existing out of facility DNR order (yellow form or pink MOST form) --  "MOST" Form in Place? --      Prognosis:  Poor long-term  Discharge Planning: To Be Determined  Care plan was discussed with RN, Dr Blake Divine, chaplain  Thank you for allowing the Palliative Medicine Team to assist in the care of this patient.   Total Time 20 Prolonged Time Billed  no      Greater than 50%  of this time was spent counseling and coordinating care related to the above assessment and plan.  Vennie Homans, DNP, FNP-C Palliative Medicine Team  Phone: [phone] Fax: [phone]  Please contact Palliative Medicine Team phone at [phone] for questions and concerns.

## 2019-10-11 NOTE — Anesthesia Postprocedure Evaluation (Signed)
Anesthesia Post Note  Patient: Amber Bean  Procedure(s) Performed: ESOPHAGOGASTRODUODENOSCOPY (EGD) (N/A ) HOT HEMOSTASIS (ARGON PLASMA COAGULATION/BICAP) (N/A ) [name]     Patient location during evaluation: PACU Anesthesia Type: MAC Level of consciousness: awake and alert Pain management: pain level controlled Vital Signs Assessment: post-procedure vital signs reviewed and stable Respiratory status: spontaneous breathing, nonlabored ventilation, respiratory function stable and patient connected to nasal cannula oxygen Cardiovascular status: stable and blood pressure returned to baseline Postop Assessment: no apparent nausea or vomiting Anesthetic complications: no   No complications documented.  Last Vitals:  Vitals:   10/11/19 0457 10/11/19 0737  BP: (!) 155/57 (!) 158/54  Pulse: 72 67  Resp: 17   Temp: 36.8 C 36.9 C  SpO2: 93% 94%    Last Pain:  Vitals:   10/11/19 0737  TempSrc: Oral  PainSc:                  [name] S

## 2019-10-11 NOTE — Progress Notes (Signed)
Physical Therapy Wound Treatment Patient Details  Name: Amber Bean MRN: [id number] Date of Birth: 1940-11-17  Today's Date: 10/11/2019 Time: 1130-1218 Time Calculation (min): 48 min  Subjective  Subjective: Pt very drowsy during session  Patient and Family Stated Goals: unable to state Date of Onset:  (unknown) Prior Treatments: dressing  Pain Score:  Pt given pain medication prior to treatment and tolerated session well.   Wound Assessment  Pressure Injury 10/07/19 Coccyx Right;Left;Medial Unstageable - Full thickness tissue loss in which the base of the injury is covered by slough (yellow, tan, gray, green or brown) and/or eschar (tan, brown or black) in the wound bed. (Active)  Wound Image   10/09/19 1500  Dressing Type ABD;Gauze (Comment);Barrier Film (skin prep);Normal saline moist dressing 10/11/19 1500  Dressing Changed;Clean;Dry;Intact 10/11/19 1500  Dressing Change Frequency Twice a day 10/11/19 1500  State of Healing Early/partial granulation 10/11/19 1500  Site / Wound Assessment Bleeding;Brown;Red;Yellow;Granulation tissue;Friable 10/11/19 1500  % Wound base Red or Granulating 40% 10/11/19 1500  % Wound base Yellow/Fibrinous Exudate 15% 10/11/19 1500  % Wound base Black/Eschar 45% 10/11/19 1500  Peri-wound Assessment Erythema (blanchable);Denuded;Maceration 10/11/19 1500  Wound Length (cm) 8.7 cm 10/10/19 1300  Wound Width (cm) 9.4 cm 10/10/19 1300  Wound Depth (cm) 3.4 cm 10/10/19 1300  Wound Surface Area (cm^2) 81.78 cm^2 10/10/19 1300  Wound Volume (cm^3) 278.05 cm^3 10/10/19 1300  Undermining (cm) 1.4 cm at 12 o'clock and 5.1 cm at 3 o'clock 10/10/19 1300  Margins Unattached edges (unapproximated) 10/11/19 1500  Drainage Amount Moderate 10/11/19 1500  Drainage Description Serosanguineous 10/11/19 1500  Treatment Debridement (Selective);Hydrotherapy (Pulse lavage);Packing (Saline gauze) 10/11/19 1500   Hydrotherapy Pulsed lavage therapy - wound location:  sacrum Pulsed Lavage with Suction (psi): 12 psi Pulsed Lavage with Suction - Normal Saline Used: 1000 mL Pulsed Lavage Tip: Tip with splash shield Selective Debridement Selective Debridement - Location: sacrum  Selective Debridement - Tools Used: Forceps;Scalpel;Scissors Selective Debridement - Tissue Removed: brown eschar, brown gelatinous necrotic tissue   Wound Assessment and Plan  Wound Therapy - Assess/Plan/Recommendations Wound Therapy - Clinical Statement: Pt very drowsy during session. Wound is looking much better today with large amount of red granulation tissue visible. Pt will continue to benefit from pulse lavage and selective debridement to reduce bioburden and facilitate wound healing.  Wound Therapy - Functional Problem List: decrease mobility Factors Delaying/Impairing Wound Healing: Multiple medical problems;Immobility Hydrotherapy Plan: Debridement;Pulsatile lavage with suction Wound Therapy - Frequency: 6X / week Wound Therapy - Follow Up Recommendations: Skilled nursing facility Wound Plan: see above  Wound Therapy Goals- Improve the function of patient's integumentary system by progressing the wound(s) through the phases of wound healing (inflammation - proliferation - remodeling) by: Decrease Necrotic Tissue to: 70 Decrease Necrotic Tissue - Progress: Progressing toward goal Increase Granulation Tissue to: 30 Increase Granulation Tissue - Progress: Progressing toward goal Improve Drainage Characteristics: Min Improve Drainage Characteristics - Progress: Not progressing Goals/treatment plan/discharge plan were made with and agreed upon by patient/family: No, Patient unable to participate in goals/treatment/discharge plan and family unavailable Time For Goal Achievement: 7 days Wound Therapy - Potential for Goals: Fair  Goals will be updated until maximal potential achieved or discharge criteria met.  Discharge criteria: when goals achieved, discharge from hospital,  MD decision/surgical intervention, no progress towards goals, refusal/missing three consecutive treatments without notification or medical reason.  GP    Dani Gobble. Migdalia Dk PT, DPT Acute Rehabilitation Services Pager [phone] Office [phone]  Dani Gobble  [employer] 10/11/2019, 3:36 PM

## 2019-10-12 DIAGNOSIS — F0390 Unspecified dementia without behavioral disturbance: Secondary | ICD-10-CM

## 2019-10-12 DIAGNOSIS — R531 Weakness: Secondary | ICD-10-CM

## 2019-10-12 LAB — CULTURE, BLOOD (ROUTINE X 2)
Culture: NO GROWTH
Culture: NO GROWTH
Special Requests: ADEQUATE

## 2019-10-12 NOTE — Progress Notes (Signed)
[facility] Progress Note  3 Days Post-Op  Subjective: CC-  Seen with PT during hydrotherapy.  Objective: Vital signs in last 24 hours: Temp:  [97.7 F (36.5 C)-98.7 F (37.1 C)] 98.7 F (37.1 C) (08/19 0715) Pulse Rate:  [53-74] 70 (08/19 0902) Resp:  [18-20] 20 (08/19 0715) BP: (127-155)/(55-69) 154/57 (08/19 0902) SpO2:  [93 %-98 %] 96 % (08/19 0902) Last BM Date: 10/10/19  Intake/Output from previous day: 08/18 0701 - 08/19 0700 In: 2217.1 [P.O.:100; I.V.:1617.1; IV Piggyback:500] Out: 2200 [Urine:2200] Intake/Output this shift: No intake/output data recorded.  PE: Gen:  Alert, NAD Pulm: rate and effort normal GU: some circumferential undermining, mostly from 3-6 o'clock, no purulent drainage, ~80% fibrinous exudate, otherwise wound beefy red and with significant improvement     Lab Results:  Recent Labs    10/10/19 0437 10/11/19 0542  WBC 11.4* 13.8*  HGB 8.0* 9.4*  HCT 27.7* 31.7*  PLT 655* 664*   BMET Recent Labs    10/10/19 0437 10/11/19 0542  NA 141 138  K 3.1* 3.6  CL 106 103  CO2 25 27  GLUCOSE 77 82  BUN <5* 5*  CREATININE 0.47 0.51  CALCIUM 8.6* 8.1*   PT/INR No results for input(s): LABPROT, INR in the last 72 hours. CMP     Component Value Date/Time   NA 138 10/11/2019 0542   NA 138 09/02/2015 1358   K 3.6 10/11/2019 0542   CL 103 10/11/2019 0542   CO2 27 10/11/2019 0542   GLUCOSE 82 10/11/2019 0542   BUN 5 (L) 10/11/2019 0542   BUN 18 09/02/2015 1358   CREATININE 0.51 10/11/2019 0542   CALCIUM 8.1 (L) 10/11/2019 0542   PROT 5.0 (L) 10/09/2019 0145   PROT 7.2 09/02/2015 1358   ALBUMIN 1.8 (L) 10/09/2019 0145   ALBUMIN 4.3 09/02/2015 1358   AST 16 10/09/2019 0145   ALT 22 10/09/2019 0145   ALKPHOS 74 10/09/2019 0145   BILITOT 0.5 10/09/2019 0145   BILITOT 0.2 09/02/2015 1358   GFRNONAA >60 10/11/2019 0542   GFRAA >60 10/11/2019 0542   Lipase     Component Value Date/Time   LIPASE 41 06/04/2018 2109        Studies/Results: DG Swallowing Func-Speech Pathology  Result Date: 10/10/2019 Objective Swallowing Evaluation: Type of Study: MBS-Modified Barium Swallow Study  Patient Details Name: DAWANA ASPER MRN: [id number] Date of Birth: [birth year] Today's Date: 10/10/2019 Time: SLP Start Time (ACUTE ONLY): 1320 -SLP Stop Time (ACUTE ONLY): 1338 SLP Time Calculation (min) (ACUTE ONLY): 18 min Past Medical History: Past Medical History: Diagnosis Date . Anemia, iron deficiency  . Anemia, vitamin B12 deficiency  . BACK PAIN, CHRONIC  . CAD (coronary artery disease)   S/P CABG in 1995. Last cath in 2003 demonstrated left main 20% stenosis, LAD 50-70% mid stenosis, circumflex 40-50% stenosis in the AV groove, 30% right coronary artery stenosis. Saphenous vein graft to the marginal was patent. A LIMA to the LAD was patent. Marland Kitchen CAROTID ARTERY STENOSIS  . Diabetes mellitus   Diet controlled since weight loss . GERD (gastroesophageal reflux disease)  . Hypothyroidism  . Mitral valve disorders(424.0)  . Obstructive sleep apnea on CPAP   intol of CPAP - noncompliant . Osteopenia  . Pancreatitis  . Pseudoaneurysm [facility])   Requiring repair following last cath . Tobacco abuse   Quit 02/2010 Past Surgical History: Past Surgical History: Procedure Laterality Date . ABDOMINAL HYSTERECTOMY   . APPENDECTOMY   . BIOPSY  10/09/2019  Procedure: BIOPSY;  Surgeon: Lemar Lofty., MD;  Location: [facility] ENDOSCOPY;  Service: Gastroenterology;; . BREAST LUMPECTOMY   . CARDIAC CATHETERIZATION  2003 . CORONARY ARTERY BYPASS GRAFT  1995  [place], [place] . ESOPHAGOGASTRODUODENOSCOPY N/A 10/09/2019  Procedure: ESOPHAGOGASTRODUODENOSCOPY (EGD);  Surgeon: Lemar Lofty., MD;  Location[facility] ENDOSCOPY;  Service: Gastroenterology;  Laterality: N/A; . HEMOSTASIS CLIP PLACEMENT  10/09/2019  Procedure: HEMOSTASIS CLIP PLACEMENT;  Surgeon: Lemar Lofty., MD;  Location: [facility] ENDOSCOPY;  Service: Gastroenterology;; . HOT HEMOSTASIS N/A  10/09/2019  Procedure: HOT HEMOSTASIS (ARGON PLASMA COAGULATION/BICAP);  Surgeon: Lemar Lofty., MD;  Location: [facility] ENDOSCOPY;  Service: Gastroenterology;  Laterality: N/A; . LUMBAR FUSION   . SAPHENOUS VEIN GRAFT RESECTION  2003 . TONSILLECTOMY   HPI: Pt is a 79 yo female with recent hospitalization [facility]) in June for UTI/colitis/COVID. She is admitted from SNF with sepsis from sacral ulcer. GI also consulted for possible GIB. MBS was completed at [facility] in June 2021 revealing cord level penetration with thin/nectar thick liquids. FEES repeated several days later showed improvements including adequate pharyngeal function but reduced oral control and coordination. Dys 2 (chopped) diet and thin liquids recommended. PMH includes: dementia, DM, previous tobacco use, anemia, CAD, HTN, GERD  Subjective: alert, minimally communicative Assessment / Plan / Recommendation CHL IP CLINICAL IMPRESSIONS 10/10/2019 Clinical Impression Pt presents with oropharyngeal dysphagia characterized by impaired mastication, reduced lingual retraction, and a pharyngeal delay. Mastication was significantly prolonged and mechanical soft (chopped) solids were ultimately spat out by pt due to her difficulty with mastication. She demonstrated vallecular residue with solids and penetration (PAS 3) of thin liquids which was inconsistently eliminated with prompted use of smaller boluses; however, she exhibited difficulty consistently utilizing this strategy. Pt demonstrated throat clearing between swallows of thin liquids, and aspiration of penetrated material is therefore suspected. No instances of penetration/aspiration were noted with nectar thick liquids. A cricopharyngeal bar was noted during deglutition of liquids but esophageal phase was [facility]. A dysphagia 1 (puree) diet with nectar thick liquids is recommended at this time. SLP will continue to follow pt and prognosis for advancement is judged to be good if her mentation improves further.   SLP Visit Diagnosis Dysphagia, oropharyngeal phase (R13.12) Attention and concentration deficit following -- Frontal lobe and executive function deficit following -- Impact on safety and function Moderate aspiration risk   CHL IP TREATMENT RECOMMENDATION 10/10/2019 Treatment Recommendations Therapy as outlined in treatment plan below   Prognosis 10/10/2019 Prognosis for Safe Diet Advancement Good Barriers to Reach Goals Cognitive deficits Barriers/Prognosis Comment -- CHL IP DIET RECOMMENDATION 10/10/2019 SLP Diet Recommendations Dysphagia 1 (Puree) solids;Nectar thick liquid Liquid Administration via Cup;Straw Medication Administration Whole meds with liquid Compensations Slow rate;Small sips/bites Postural Changes Seated upright at 90 degrees   CHL IP OTHER RECOMMENDATIONS 10/10/2019 Recommended Consults -- Oral Care Recommendations Oral care BID Other Recommendations --   CHL IP FOLLOW UP RECOMMENDATIONS 10/10/2019 Follow up Recommendations Skilled Nursing facility   [facility] IP FREQUENCY AND DURATION 10/10/2019 Speech Therapy Frequency (ACUTE ONLY) min 2x/week Treatment Duration 2 weeks      CHL IP ORAL PHASE 10/10/2019 Oral Phase Impaired Oral - Pudding Teaspoon -- Oral - Pudding Cup -- Oral - Honey Teaspoon -- Oral - Honey Cup -- Oral - Nectar Teaspoon -- Oral - Nectar Cup -- Oral - Nectar Straw WFL Oral - Thin Teaspoon -- Oral - Thin Cup WFL Oral - Thin Straw WFL Oral - Puree WFL Oral - Mech Soft Impaired mastication Oral - Regular  Impaired mastication Oral - Multi-Consistency -- Oral - Pill -- Oral Phase - Comment --  CHL IP PHARYNGEAL PHASE 10/10/2019 Pharyngeal Phase Impaired Pharyngeal- Pudding Teaspoon -- Pharyngeal -- Pharyngeal- Pudding Cup -- Pharyngeal -- Pharyngeal- Honey Teaspoon -- Pharyngeal -- Pharyngeal- Honey Cup -- Pharyngeal -- Pharyngeal- Nectar Teaspoon -- Pharyngeal -- Pharyngeal- Nectar Cup -- Pharyngeal -- Pharyngeal- Nectar Straw Delayed swallow initiation-pyriform sinuses;Delayed swallow  initiation-vallecula Pharyngeal -- Pharyngeal- Thin Teaspoon -- Pharyngeal -- Pharyngeal- Thin Cup -- Pharyngeal -- Pharyngeal- Thin Straw Delayed swallow initiation-pyriform sinuses;Delayed swallow initiation-vallecula Pharyngeal -- Pharyngeal- Puree Delayed swallow initiation-pyriform sinuses;Delayed swallow initiation-vallecula;Pharyngeal residue - valleculae;Reduced tongue base retraction Pharyngeal -- Pharyngeal- Mechanical Soft Delayed swallow initiation-pyriform sinuses;Delayed swallow initiation-vallecula;Pharyngeal residue - valleculae;Reduced tongue base retraction Pharyngeal -- Pharyngeal- Regular Delayed swallow initiation-pyriform sinuses;Delayed swallow initiation-vallecula;Pharyngeal residue - valleculae;Reduced tongue base retraction Pharyngeal -- Pharyngeal- Multi-consistency -- Pharyngeal -- Pharyngeal- Pill -- Pharyngeal -- Pharyngeal Comment --  CHL IP CERVICAL ESOPHAGEAL PHASE 10/10/2019 Cervical Esophageal Phase (No Data) Pudding Teaspoon -- Pudding Cup -- Honey Teaspoon -- Honey Cup -- Nectar Teaspoon -- Nectar Cup -- Nectar Straw -- Thin Teaspoon -- Thin Cup -- Thin Straw -- Puree -- Mechanical Soft -- Regular -- Multi-consistency -- Pill -- Cervical Esophageal Comment -- Shanika I. Vear ClockPhillips, MS, CCC-SLP Acute Rehabilitation Services Office number [phone] Pager [phone] Scheryl MartenShanika I Phillips 10/10/2019, 2:36 PM               Anti-infectives: Anti-infectives (From admission, onward)   Start     Dose/Rate Route Frequency Ordered Stop   10/10/19 1900  vancomycin (VANCOREADY) IVPB 1250 mg/250 mL  Status:  Discontinued        1,250 mg 166.7 mL/hr over 90 Minutes Intravenous Every 12 hours 10/10/19 0815 10/10/19 0819   10/10/19 1900  vancomycin (VANCOCIN) IVPB 1000 mg/200 mL premix        1,000 mg 200 mL/hr over 60 Minutes Intravenous Every 12 hours 10/10/19 0819     10/08/19 0500  vancomycin (VANCOREADY) IVPB 750 mg/150 mL  Status:  Discontinued        750 mg 150 mL/hr over 60  Minutes Intravenous Every 12 hours 10/07/19 1952 10/10/19 0815   10/07/19 2230  piperacillin-tazobactam (ZOSYN) IVPB 3.375 g  Status:  Discontinued        3.375 g 12.5 mL/hr over 240 Minutes Intravenous Every 8 hours 10/07/19 1952 10/07/19 2127   10/07/19 2200  cefTRIAXone (ROCEPHIN) 2 g in sodium chloride 0.9 % 100 mL IVPB        2 g 200 mL/hr over 30 Minutes Intravenous Every 24 hours 10/07/19 2037     10/07/19 2045  vancomycin (VANCOCIN) IVPB 1000 mg/200 mL premix  Status:  Discontinued        1,000 mg 200 mL/hr over 60 Minutes Intravenous  Once 10/07/19 2037 10/07/19 2055   10/07/19 1615  piperacillin-tazobactam (ZOSYN) IVPB 3.375 g        3.375 g 100 mL/hr over 30 Minutes Intravenous  Once 10/07/19 1610 10/07/19 1710   10/07/19 1615  vancomycin (VANCOREADY) IVPB 1500 mg/300 mL        1,500 mg 150 mL/hr over 120 Minutes Intravenous  Once 10/07/19 1610 10/07/19 1845       Assessment/Plan Dementia Recent COVID-19 pneumonia 07/2019 DM Hypothyroidism CAD s/p remote CABG HTN ABL anemia   Sacral wound - CT 8/14 shows mottled air collection within the subcutaneous fat right of midline just deep to the ulceration measuring 1.6 x 5 cm possibly developing subcutaneous  abscess; likely some osteomyelitis of sacrum  ID - rocephin 8/14>>, vancomycin 8/14>> FEN - IVF, NPO VTE - SCDs Foley - none Follow up - TBD  Plan - Wound significantly improved. No role for surgical intervention. Continue hydrotherapy, BID wet to dry dressing changes with santyl.  General surgery will sign off, please call with concerns.    LOS: 5 days    Franne Forts, [facility] 10/12/2019, 9:37 AM Please see Amion for pager number during day hours 7:00am-4:30pm

## 2019-10-12 NOTE — Progress Notes (Signed)
Physical Therapy Wound Treatment Patient Details  Name: Amber Bean MRN: [id number] Date of Birth: [birth year]  Today's Date: 10/12/2019 Time: 0921-0958 Time Calculation (min): 37 min  Subjective  Subjective: Pt sleeping through most of treatment Patient and Family Stated Goals: unable to state Date of Onset:  (unknown) Prior Treatments: dressing  Pain Score: 2/10  Wound Assessment  Pressure Injury 10/07/19 Coccyx Right;Left;Medial Unstageable - Full thickness tissue loss in which the base of the injury is covered by slough (yellow, tan, gray, green or brown) and/or eschar (tan, brown or black) in the wound bed. (Active)  Dressing Type ABD;Barrier Film (skin prep);Gauze (Comment);Moist to moist 10/12/19 1125  Dressing Changed;Clean;Dry;Intact 10/12/19 1125  Dressing Change Frequency Twice a day 10/12/19 1125  State of Healing Early/partial granulation 10/12/19 1125  Site / Wound Assessment Yellow;Granulation tissue 10/12/19 1125  % Wound base Red or Granulating 40% 10/12/19 0400  % Wound base Yellow/Fibrinous Exudate 50% 10/12/19 0400  % Wound base Black/Eschar 45% 10/11/19 1500  % Wound base Other/Granulation Tissue (Comment) 10% 10/12/19 0400  Peri-wound Assessment Erythema (blanchable) 10/12/19 1125  Wound Length (cm) 8.7 cm 10/10/19 1300  Wound Width (cm) 9.4 cm 10/10/19 1300  Wound Depth (cm) 3.4 cm 10/10/19 1300  Wound Surface Area (cm^2) 81.78 cm^2 10/10/19 1300  Wound Volume (cm^3) 278.05 cm^3 10/10/19 1300  Undermining (cm) 1.4 at 12 o clock and 5.1 at 3 o clock 10/12/19 1125  Margins Unattached edges (unapproximated) 10/12/19 1125  Drainage Amount Moderate 10/12/19 1125  Drainage Description Serous;Serosanguineous 10/12/19 1125  Treatment Debridement (Selective);Hydrotherapy (Pulse lavage);Packing (Saline gauze) 10/12/19 1125  Santyl applied to wound bed prior to applying dressing.   Hydrotherapy Pulsed lavage therapy - wound location: sacrum Pulsed Lavage with  Suction (psi): 12 psi Pulsed Lavage with Suction - Normal Saline Used: 1000 mL Pulsed Lavage Tip: Tip with splash shield Selective Debridement Selective Debridement - Location: sacrum  Selective Debridement - Tools Used: Forceps;Scalpel;Scissors Selective Debridement - Tissue Removed: brown eschar, brown gelatinous necrotic tissue   Wound Assessment and Plan  Wound Therapy - Assess/Plan/Recommendations Wound Therapy - Clinical Statement: Distal portion of wound improving with larger portion of red granulation tissue. Focused on debridement of superior portion of wound today. Pt will continue to benefit from pulse lavage and selective debridement to reduce bioburden and facilitate wound healing.  Wound Therapy - Functional Problem List: decrease mobility Factors Delaying/Impairing Wound Healing: Multiple medical problems;Immobility Hydrotherapy Plan: Debridement;Pulsatile lavage with suction Wound Therapy - Frequency: 6X / week Wound Therapy - Follow Up Recommendations: Skilled nursing facility Wound Plan: see above  Wound Therapy Goals- Improve the function of patient's integumentary system by progressing the wound(s) through the phases of wound healing (inflammation - proliferation - remodeling) by: Decrease Necrotic Tissue to: 70 Decrease Necrotic Tissue - Progress: Progressing toward goal Increase Granulation Tissue to: 30 Increase Granulation Tissue - Progress: Progressing toward goal Improve Drainage Characteristics: Min Improve Drainage Characteristics - Progress: Progressing toward goal Goals/treatment plan/discharge plan were made with and agreed upon by patient/family: No, Patient unable to participate in goals/treatment/discharge plan and family unavailable Time For Goal Achievement: 7 days Wound Therapy - Potential for Goals: Fair  Goals will be updated until maximal potential achieved or discharge criteria met.  Discharge criteria: when goals achieved, discharge from  hospital, MD decision/surgical intervention, no progress towards goals, refusal/missing three consecutive treatments without notification or medical reason.  GP      Wyona Almas, PT, DPT Acute Rehabilitation Services Pager [phone] Office [phone]  Deno Etienne 10/12/2019, 11:29 AM

## 2019-10-12 NOTE — Progress Notes (Signed)
Patients daughter Yvone Neu called by phone to explain that she needed her husband to hospital with her. Hospital policy on visitation given to daughter and she stated she still wanted to bring her husband as "he is the one that has to drive her up here to see her mom". She needed help with making decisions regarding the patient/ her mom.  I recommended her to speak with the care team.   Visitation policy explained to daughter again.  Amatullah Christy, Randall An RN

## 2019-10-12 NOTE — Progress Notes (Signed)
Daily Progress Note   Patient Name: Janalyn Shy       Date: 10/12/2019 DOB: August 08, 1940  Age: 79 y.o. MRN#: [id number] Attending Physician: Marinda Elk, MD Primary Care Physician: Veverly Fells, MD Admit Date: 10/07/2019   Reason for Consultation/Follow-up: Establishing goals of care  Subjective: Patient lethargic this AM. RN unable to get patient to safely take pills or accept breakfast. No family at bedside. No s/s of pain or discomfort.   GOC:  Spoke >60 minutes with patient's daughter, Thurston Hole via telephone to discuss goals of care.   Discussed at length events leading up to admission, course of hospitalization including diagnoses, interventions, plan of care. Therapeutic listening as Thurston Hole shares many frustrations with the care her mother received at SNF and not knowing how bad the bed sore was. Emotional/spiritual support provided.   Thurston Hole struggles with the decision of where to go from here. Her mother has been begging her to return home when discharged. Thurston Hole is unsure if she will be able to provide proper level of care by herself at home (minimal support from her sisters who are not local). Thurston Hole does share that she does not want her mother to die somewhere alone. She asked her mother yesterday if she was "ready to die" and her mother told her "no" therefore Thurston Hole struggles with the decision to shift to comfort focused pathway and start hospice. But also she makes comments about knowing her mother has a poor prognosis and the "death stare" yesterday.   Discussed in detail hospice philosophy and home hospice services, emphasizing focus on comfort, quality of life, allowing nature to take course, focus on symptom management and preventing recurrent hospitalization, and dignity as she  nears EOL. Answered all questions and concerns and strongly encouraged Thurston Hole to consider home hospice, as her mother is very high risk for recurrent hospitalization and inability to heal this wound with bedbound status and poor nutritional status/albumin.    She does confirm discussion with PMT provider Marcelino Duster and decision for DNR/DNI and NO feeding tube status.   In conclusion, Thurston Hole states "keep fighting." She plans to attempt further discussions with her two sisters and other family members to see if she can get more support if home with hospice. Reassured of ongoing PMT support.     Length of Stay: 5  Current Medications: Scheduled Meds:   amitriptyline  150 mg Oral QHS   ascorbic acid  500 mg Oral Daily   atenolol  50 mg Oral Daily   Chlorhexidine Gluconate Cloth  6 each Topical Daily   collagenase   Topical BID   diltiazem  120 mg Oral Daily   donepezil  5 mg Oral QHS   ferrous gluconate  324 mg Oral TID WC   gabapentin  100 mg Oral TID   levothyroxine  25 mcg Oral QAC breakfast   lisinopril  5 mg Oral Daily   pantoprazole  40 mg Oral BID   sertraline  100 mg Oral Daily   thiamine  100 mg Oral Daily   zinc sulfate  220 mg Oral Daily    Continuous Infusions:  sodium chloride 10 mL/hr at 10/12/19 1500   cefTRIAXone (ROCEPHIN)  IV 2 g (10/11/19 2231)   lactated ringers 100 mL/hr at 10/12/19 1500   vancomycin Stopped (10/12/19 0651)    PRN Meds: acetaminophen, ketorolac, nitroGLYCERIN, ondansetron **OR** ondansetron (ZOFRAN) IV, Resource ThickenUp Clear  Physical Exam Vitals and nursing note reviewed.  Constitutional:      Appearance: She is cachectic. She is ill-appearing.  HENT:     Head: Normocephalic and atraumatic.  Cardiovascular:     Heart sounds: Normal heart sounds.  Pulmonary:     Effort: No tachypnea, accessory muscle usage or respiratory distress.     Breath sounds: Normal breath sounds.  Skin:    General: Skin is warm and dry.   Neurological:     Mental Status: She is lethargic.     Comments: Baseline dementia  Psychiatric:        Attention and Perception: She is inattentive.        Cognition and Memory: Cognition is impaired.            Vital Signs: BP 114/90 (BP Location: Right Arm)    Pulse 85    Temp 98 F (36.7 C) (Oral)    Resp 18    Ht 5\' 7"  (1.702 m)    Wt 68 kg Comment: estimated   SpO2 92%    BMI 23.48 kg/m  SpO2: SpO2: 92 % O2 Device: O2 Device: Room Air O2 Flow Rate: O2 Flow Rate (L/min): 2 L/min  Intake/output summary:   Intake/Output Summary (Last 24 hours) at 10/12/2019 1623 Last data filed at 10/12/2019 1500 Gross per 24 hour  Intake 3414.51 ml  Output 2850 ml  Net 564.51 ml   LBM: Last BM Date: 10/10/19 Baseline Weight: Weight: 68 kg (estimated) Most recent weight: Weight: 68 kg (estimated)       Palliative Assessment/Data: PPS 30%      Patient Active Problem List   Diagnosis Date Noted   Dementia without behavioral disturbance (HCC)    Weakness    Palliative care by specialist    Goals of care, counseling/discussion    DNR (do not resuscitate)    Sacral decubitus ulcer, stage IV (HCC) 10/08/2019   Sepsis (HCC) 10/07/2019   Coronary artery disease involving native coronary artery of native heart without angina pectoris 07/13/2018   Essential hypertension 07/13/2018   Dyslipidemia 07/13/2018   Edema 07/13/2018   Educated about COVID-19 virus infection 07/13/2018   Hyperlipidemia 08/06/2010   Dysphagia 04/03/2010   ANEMIA-IRON DEFICIENCY 10/07/2009   GERD 10/07/2009   KIDNEY DISEASE 10/07/2009   Chronic back pain 10/07/2009   TOBACCO ABUSE 02/27/2009   Mitral valve disease 02/27/2009   CAROTID ARTERY STENOSIS  02/27/2009   Hypothyroidism 12/31/2008   T2DM (type 2 diabetes mellitus) (HCC) 12/31/2008   CAD (coronary artery disease) 12/31/2008   PANCREATITIS 12/31/2008   SLEEP APNEA 12/31/2008    Palliative Care Assessment & Plan    Patient Profile: Per intake H&P --> Daniesha E Neelyis a 79 y.o.femalewith medical history significant ofdementia, UTI, COVID-19 pneumonia in June, diabetes, previous tobacco abuse, iron deficiency anemia coronary artery disease, hypertension, [facility] decubitus ulcer [facility] Patient has been there for the time until the daughter went to visit today and found her in the bad shape. Her mother was weak debilitated and deep sacral decubitus ulcer. She noticed that they have been changing the dressing but also has been having darker stools with suspected GI bleed. She is weak debilitated and worse this year last saw her. She brought her to the ER where she was seen and evaluated. Patient found to be septic by criteria with a deep sacral decubitus ulcer stage IV as well as guaiac positive stool and anemia.  Palliative care was asked to address goals of care in the setting of GIB and sacral wound.   Assessment: Sepsis Infected sacral decubitus ulcer Dementia GI bleeding T2DM Dysphagia Deconditioning  Recommendations/Plan: MOST form completed 8/15. DNR/DNI, ABX/IVF if indicated, NO feeding tube. Continue current plan of care and medical management including hydrotherapy, wound care, and antibiotics.  Continue dysphagia diet. Patient need assist and encouragement with meals.  Daughter struggles with disposition decision. Discussed recommendation for hospice services with poor prognosis. Daughter considering home hospice services and plans to further discuss with family. She does not wish for her mother to die somewhere alone but unsure if she can care for her at home by herself.  PMT provider will continue to follow.    Code Status: DNR/DNI   Code Status Orders  (From admission, onward)         Start     Ordered   10/08/19 1458  Do not attempt resuscitation (DNR)  Continuous       Question Answer Comment   In the event of cardiac or respiratory ARREST Do not call a code blue   In the event of cardiac or respiratory ARREST Do not perform Intubation, CPR, defibrillation or ACLS   In the event of cardiac or respiratory ARREST Use medication by any route, position, wound care, and other measures to relive pain and suffering. May use oxygen, suction and manual treatment of airway obstruction as needed for comfort.      10/08/19 1457        Code Status History    Date Active Date Inactive Code Status Order ID Comments User Context   10/07/2019 2038 10/08/2019 1457 Full Code [id number]  Rometta Emery, MD Inpatient   10/07/2019 2038 10/07/2019 2038 Full Code [id number]  Rometta Emery, MD Inpatient   Advance Care Planning Activity    Advance Directive Documentation     Most Recent Value  Type of Advance Directive Healthcare Power of Attorney  Pre-existing out of facility DNR order (yellow form or pink MOST form) --  "MOST" Form in Place? --      Prognosis:  Poor long-term  Discharge Planning: To Be Determined  Care plan was discussed with RN, daughter Thurston Hole), updated Dr. David Stall  Thank you for allowing the Palliative Medicine Team to assist in the care of this patient.   Total Time 75 Prolonged Time Billed yes  Greater than 50% of this time was spent counseling and coordinating care related to the above assessment and plan.   Vennie HomansMegan Philamena Kramar, DNP, FNP-C Palliative Medicine Team  Phone: [phone] Fax: [phone]  Please contact Palliative Medicine Team phone at [phone] for questions and concerns.

## 2019-10-12 NOTE — Progress Notes (Signed)
[facility] PROGRESS NOTE    Progress Note  Amber Bean  ZDG:[id number] DOB: June 08, 1940 DOA: 10/07/2019 PCP: Veverly Fells, MD     Brief Narrative:   Amber Bean is an 79 y.o. female past medical history of dementia UTI COVID-19 pneumonia in June 2021, diabetes mellitus iron deficiency anemia essential hypertension sacral decubitus ulcer was brought in from skilled nursing facility for worsening decubitus ulcer wound generalized weakness and decreased oral intake.  The scan of the abdomen and pelvis shows acute decubitus ulcer with multiple air collection and subcutaneous fat deep ulceration and subcutaneous abscesses.  With circumferential wall thickening of the rectum with probable proctitis, she was admitted for sepsis secondary to infected sacral decubitus ulcer neurosurgery was consulted recommended hydrotherapy.  GI was also consulted underwent EGD that revealed gastritis since a few AVMs, there was no indication for colonoscopy at that point.  She has been having trouble swallowing and swallowing evaluation was done and she was started on a dysphagia 1 diet with pured solids and nectar thick.   Is currently undergoing hydrotherapy for stage IV sacral decubitus ulcer surgery in the meantime she has been clinically deteriorating with poor oral intake and poor progression. Hospice and palliative care has been consulted.  Assessment/Plan:   Sepsis (HCC) secondary to infected sacral decubitus ulcer stage IV: She was fluid resuscitated started on broad-spectrum antibiotics. General surgery was consulted and they recommended hydrotherapy and no debridement at that point. Need a total of 10 days of empiric antibiotic and continue hydrotherapy. She is having minimal oral intake, will need to speak to the family about her poor prognosis.  Questionable GI bleed: She status post 1 unit of packed red blood cells, status post EGD that showed gastritis and a few AVMs. GI  recommended to continue PPIs.  Dementia advanced: Continue Aricept.  Hypokalemia/hypomagnesemia: Replete orally monitor intermittently now resolved.  Essential hypertension: Well-controlled.  Diabetes mellitus type 2: With an A1c of 5.4, currently on sliding scale blood glucose fairly controlled. Patient is having minimal oral intake  Palliative care encounter: Palliative care has been consulted as as patient is having aspiration pneumonia, with poor oral intake bedbound and a stage IV sacral decubitus ulcer due to immobilization, we will need to discuss with the family her poor prognosis. Daughter was unrealistic about expectation relating that they would like to take her home and learn to take care of her at home.   DVT prophylaxis: lovenox Family Communication:none Status is: Inpatient  Remains inpatient appropriate because:Altered mental status   Dispo: The patient is from: Home              Anticipated d/c is to: SNF              Anticipated d/c date is: 3 days              Patient currently is not medically stable to d/c.     Code Status:     Code Status Orders  (From admission, onward)         Start     Ordered   10/08/19 1458  Do not attempt resuscitation (DNR)  Continuous       Question Answer Comment  In the event of cardiac or respiratory ARREST Do not call a "code blue"   In the event of cardiac or respiratory ARREST Do not perform Intubation, CPR, defibrillation or ACLS   In the event of cardiac or respiratory ARREST Use medication by any route, position,  wound care, and other measures to relive pain and suffering. May use oxygen, suction and manual treatment of airway obstruction as needed for comfort.      10/08/19 1457        Code Status History    Date Active Date Inactive Code Status Order ID Comments User Context   10/07/2019 2038 10/08/2019 1457 Full Code [id number]  Rometta Emery, MD Inpatient   10/07/2019 2038 10/07/2019 2038 Full Code  [id number]  Rometta Emery, MD Inpatient   Advance Care Planning Activity    Advance Directive Documentation     Most Recent Value  Type of Advance Directive Healthcare Power of Attorney  Pre-existing out of facility DNR order (yellow form or pink MOST form) --  "MOST" Form in Place? --        IV Access:    Peripheral IV   Procedures and diagnostic studies:   DG Swallowing Func-Speech Pathology  Result Date: 10/10/2019 Objective Swallowing Evaluation: Type of Study: MBS-Modified Barium Swallow Study  Patient Details Name: Amber Bean MRN: [id number] Date of Birth: [birth year] Today's Date: 10/10/2019 Time: SLP Start Time (ACUTE ONLY): 1320 -SLP Stop Time (ACUTE ONLY): 1338 SLP Time Calculation (min) (ACUTE ONLY): 18 min Past Medical History: Past Medical History: Diagnosis Date . Anemia, iron deficiency  . Anemia, vitamin B12 deficiency  . BACK PAIN, CHRONIC  . CAD (coronary artery disease)   S/P CABG in 1995. Last cath in 2003 demonstrated left main 20% stenosis, LAD 50-70% mid stenosis, circumflex 40-50% stenosis in the AV groove, 30% right coronary artery stenosis. Saphenous vein graft to the marginal was patent. A LIMA to the LAD was patent. Marland Kitchen CAROTID ARTERY STENOSIS  . Diabetes mellitus   Diet controlled since weight loss . GERD (gastroesophageal reflux disease)  . Hypothyroidism  . Mitral valve disorders(424.0)  . Obstructive sleep apnea on CPAP   intol of CPAP - noncompliant . Osteopenia  . Pancreatitis  . Pseudoaneurysm [facility])   Requiring repair following last cath . Tobacco abuse   Quit 02/2010 Past Surgical History: Past Surgical History: Procedure Laterality Date . ABDOMINAL HYSTERECTOMY   . APPENDECTOMY   . BIOPSY  10/09/2019  Procedure: BIOPSY;  Surgeon: Meridee Score Netty Starring., MD;  Location: [facility] ENDOSCOPY;  Service: Gastroenterology;; . BREAST LUMPECTOMY   . CARDIAC CATHETERIZATION  2003 . CORONARY ARTERY BYPASS GRAFT  1995  [place], [place] . ESOPHAGOGASTRODUODENOSCOPY N/A  10/09/2019  Procedure: ESOPHAGOGASTRODUODENOSCOPY (EGD);  Surgeon: Lemar Lofty., MD;  Location: [facility] ENDOSCOPY;  Service: Gastroenterology;  Laterality: N/A; . HEMOSTASIS CLIP PLACEMENT  10/09/2019  Procedure: HEMOSTASIS CLIP PLACEMENT;  Surgeon: Lemar Lofty., MD;  Location: [facility] ENDOSCOPY;  Service: Gastroenterology;; . HOT HEMOSTASIS N/A 10/09/2019  Procedure: HOT HEMOSTASIS (ARGON PLASMA COAGULATION/BICAP);  Surgeon: Lemar Lofty., MD;  Location: [facility] ENDOSCOPY;  Service: Gastroenterology;  Laterality: N/A; . LUMBAR FUSION   . SAPHENOUS VEIN GRAFT RESECTION  2003 . TONSILLECTOMY   HPI: Pt is a 79 yo female with recent hospitalization [facility]) in June for UTI/colitis/COVID. She is admitted from SNF with sepsis from sacral ulcer. GI also consulted for possible GIB. MBS was completed at [facility] in June 2021 revealing cord level penetration with thin/nectar thick liquids. FEES repeated several days later showed improvements including adequate pharyngeal function but reduced oral control and coordination. Dys 2 (chopped) diet and thin liquids recommended. PMH includes: dementia, DM, previous tobacco use, anemia, CAD, HTN, GERD  Subjective: alert, minimally communicative Assessment / Plan / Recommendation CHL IP CLINICAL IMPRESSIONS 10/10/2019  Clinical Impression Pt presents with oropharyngeal dysphagia characterized by impaired mastication, reduced lingual retraction, and a pharyngeal delay. Mastication was significantly prolonged and mechanical soft (chopped) solids were ultimately spat out by pt due to her difficulty with mastication. She demonstrated vallecular residue with solids and penetration (PAS 3) of thin liquids which was inconsistently eliminated with prompted use of smaller boluses; however, she exhibited difficulty consistently utilizing this strategy. Pt demonstrated throat clearing between swallows of thin liquids, and aspiration of penetrated material is therefore suspected. No  instances of penetration/aspiration were noted with nectar thick liquids. A cricopharyngeal bar was noted during deglutition of liquids but esophageal phase was [facility] A dysphagia 1 (puree) diet with nectar thick liquids is recommended at this time. SLP will continue to follow pt and prognosis for advancement is judged to be good if her mentation improves further.  SLP Visit Diagnosis Dysphagia, oropharyngeal phase (R13.12) Attention and concentration deficit following -- Frontal lobe and executive function deficit following -- Impact on safety and function Moderate aspiration risk   CHL IP TREATMENT RECOMMENDATION 10/10/2019 Treatment Recommendations Therapy as outlined in treatment plan below   Prognosis 10/10/2019 Prognosis for Safe Diet Advancement Good Barriers to Reach Goals Cognitive deficits Barriers/Prognosis Comment -- CHL IP DIET RECOMMENDATION 10/10/2019 SLP Diet Recommendations Dysphagia 1 (Puree) solids;Nectar thick liquid Liquid Administration via Cup;Straw Medication Administration Whole meds with liquid Compensations Slow rate;Small sips/bites Postural Changes Seated upright at 90 degrees   CHL IP OTHER RECOMMENDATIONS 10/10/2019 Recommended Consults -- Oral Care Recommendations Oral care BID Other Recommendations --   CHL IP FOLLOW UP RECOMMENDATIONS 10/10/2019 Follow up Recommendations Skilled Nursing facility   [facility] IP FREQUENCY AND DURATION 10/10/2019 Speech Therapy Frequency (ACUTE ONLY) min 2x/week Treatment Duration 2 weeks      CHL IP ORAL PHASE 10/10/2019 Oral Phase Impaired Oral - Pudding Teaspoon -- Oral - Pudding Cup -- Oral - Honey Teaspoon -- Oral - Honey Cup -- Oral - Nectar Teaspoon -- Oral - Nectar Cup -- Oral - Nectar Straw WFL Oral - Thin Teaspoon -- Oral - Thin Cup WFL Oral - Thin Straw WFL Oral - Puree WFL Oral - Mech Soft Impaired mastication Oral - Regular Impaired mastication Oral - Multi-Consistency -- Oral - Pill -- Oral Phase - Comment --  CHL IP PHARYNGEAL PHASE 10/10/2019  Pharyngeal Phase Impaired Pharyngeal- Pudding Teaspoon -- Pharyngeal -- Pharyngeal- Pudding Cup -- Pharyngeal -- Pharyngeal- Honey Teaspoon -- Pharyngeal -- Pharyngeal- Honey Cup -- Pharyngeal -- Pharyngeal- Nectar Teaspoon -- Pharyngeal -- Pharyngeal- Nectar Cup -- Pharyngeal -- Pharyngeal- Nectar Straw Delayed swallow initiation-pyriform sinuses;Delayed swallow initiation-vallecula Pharyngeal -- Pharyngeal- Thin Teaspoon -- Pharyngeal -- Pharyngeal- Thin Cup -- Pharyngeal -- Pharyngeal- Thin Straw Delayed swallow initiation-pyriform sinuses;Delayed swallow initiation-vallecula Pharyngeal -- Pharyngeal- Puree Delayed swallow initiation-pyriform sinuses;Delayed swallow initiation-vallecula;Pharyngeal residue - valleculae;Reduced tongue base retraction Pharyngeal -- Pharyngeal- Mechanical Soft Delayed swallow initiation-pyriform sinuses;Delayed swallow initiation-vallecula;Pharyngeal residue - valleculae;Reduced tongue base retraction Pharyngeal -- Pharyngeal- Regular Delayed swallow initiation-pyriform sinuses;Delayed swallow initiation-vallecula;Pharyngeal residue - valleculae;Reduced tongue base retraction Pharyngeal -- Pharyngeal- Multi-consistency -- Pharyngeal -- Pharyngeal- Pill -- Pharyngeal -- Pharyngeal Comment --  CHL IP CERVICAL ESOPHAGEAL PHASE 10/10/2019 Cervical Esophageal Phase (No Data) Pudding Teaspoon -- Pudding Cup -- Honey Teaspoon -- Honey Cup -- Nectar Teaspoon -- Nectar Cup -- Nectar Straw -- Thin Teaspoon -- Thin Cup -- Thin Straw -- Puree -- Mechanical Soft -- Regular -- Multi-consistency -- Pill -- Cervical Esophageal Comment -- Shanika I. Vear Clock, MS, CCC-SLP Acute Rehabilitation Services Office number [phone] Pager  [phone] Scheryl MartenShanika I Phillips 10/10/2019, 2:36 PM                Medical Consultants:    None.  Anti-Infectives:   IV Rocephin and vancomycin.  Subjective:    Amber Bean sleepy as per nurse the patient has not had minimal oral intake.  Objective:     Vitals:   10/11/19 2320 10/11/19 2333 10/12/19 0715 10/12/19 0902  BP:  (!) 152/55 (!) 146/63 (!) 154/57  Pulse: 74 62 73 70  Resp: 20 19 20    Temp:  98.3 F (36.8 C) 98.7 F (37.1 C)   TempSrc:  Oral Oral   SpO2: 93% 93% 93% 96%  Weight:      Height:       SpO2: 96 % O2 Flow Rate (L/min): 2 L/min   Intake/Output Summary (Last 24 hours) at 10/12/2019 0924 Last data filed at 10/12/2019 0551 Gross per 24 hour  Intake 2217.14 ml  Output 1650 ml  Net 567.14 ml   Filed Weights   10/07/19 2100  Weight: 68 kg    Exam: General exam: In no acute distress, cachectic Respiratory system: Good air movement and clear to auscultation. Cardiovascular system: S1 & S2 heard, RRR. Gastrointestinal system: Abdomen is nondistended, soft and nontender.  Extremities: No pedal edema. Skin: No rashes, lesions or ulcers  Data Reviewed:    Labs: Basic Metabolic Panel: Recent Labs  Lab 10/07/19 1507 10/07/19 1507 10/07/19 2157 10/08/19 0036 10/08/19 0036 10/09/19 0145 10/09/19 0145 10/10/19 0437 10/11/19 0542  NA 139  --   --  138  --  140  --  141 138  K 3.7   < >  --  4.3   < > 3.1*   < > 3.1* 3.6  CL 103  --   --  103  --  104  --  106 103  CO2 26  --   --  26  --  25  --  25 27  GLUCOSE 122*  --   --  105*  --  83  --  77 82  BUN 12  --   --  11  --  8  --  <5* 5*  CREATININE 0.60   < > 0.45 0.54  --  0.45  --  0.47 0.51  CALCIUM 8.9  --   --  8.7*  --  8.6*  --  8.6* 8.1*  MG  --   --   --   --   --   --   --  1.5* 2.0  PHOS  --   --   --   --   --   --   --   --  2.6   < > = values in this interval not displayed.   GFR Estimated Creatinine Clearance: 57.3 mL/min (by C-G formula based on SCr of 0.51 mg/dL). Liver Function Tests: Recent Labs  Lab 10/07/19 1507 10/08/19 0036 10/09/19 0145  AST 18 20 16   ALT 32 31 22  ALKPHOS 85 82 74  BILITOT 0.4 0.5 0.5  PROT 5.9* 5.7* 5.0*  ALBUMIN 2.0* 2.0* 1.8*   No results for input(s): LIPASE, AMYLASE in the last 168  hours. No results for input(s): AMMONIA in the last 168 hours. Coagulation profile Recent Labs  Lab 10/07/19 1700 10/08/19 0036  INR 1.1 1.1   COVID-19 Labs  No results for input(s): DDIMER, FERRITIN, LDH, CRP in the last 72 hours.  Lab Results  Component Value Date   SARSCOV2NAA NEGATIVE 10/07/2019    CBC: Recent Labs  Lab 10/07/19 1507 10/07/19 1507 10/07/19 2157 10/08/19 0036 10/09/19 0145 10/10/19 0437 10/11/19 0542  WBC 14.6*   < > 20.3* 20.6* 11.7* 11.4* 13.8*  NEUTROABS 10.9*  --   --   --  7.9* 8.1* 9.8*  HGB 7.6*   < > 7.6* 7.5* 7.9* 8.0* 9.4*  HCT 27.5*   < > 27.1* 26.4* 26.2* 27.7* 31.7*  MCV 81.1   < > 81.6 80.0 81.9 82.2 82.1  PLT 837*   < > 775* 763* 702* 655* 664*   < > = values in this interval not displayed.   Cardiac Enzymes: No results for input(s): CKTOTAL, CKMB, CKMBINDEX, TROPONINI in the last 168 hours. BNP (last 3 results) No results for input(s): PROBNP in the last 8760 hours. CBG: Recent Labs  Lab 10/07/19 1441 10/08/19 0905 10/08/19 1108 10/08/19 1614  GLUCAP 123* 90 91 84   D-Dimer: No results for input(s): DDIMER in the last 72 hours. Hgb A1c: No results for input(s): HGBA1C in the last 72 hours. Lipid Profile: No results for input(s): CHOL, HDL, LDLCALC, TRIG, CHOLHDL, LDLDIRECT in the last 72 hours. Thyroid function studies: No results for input(s): TSH, T4TOTAL, T3FREE, THYROIDAB in the last 72 hours.  Invalid input(s): FREET3 Anemia work up: No results for input(s): VITAMINB12, FOLATE, FERRITIN, TIBC, IRON, RETICCTPCT in the last 72 hours. Sepsis Labs: Recent Labs  Lab 10/07/19 1506 10/07/19 1507 10/07/19 1700 10/07/19 2157 10/07/19 2157 10/08/19 0036 10/08/19 0828 10/09/19 0145 10/10/19 0437 10/11/19 0542  PROCALCITON  --   --   --   --   --   --  <0.10  --   --   --   WBC  --    < >  --  20.3*   < > 20.6*  --  11.7* 11.4* 13.8*  LATICACIDVEN 1.2  --  2.5* 1.4  --   --   --   --   --   --    < > = values in  this interval not displayed.   Microbiology Recent Results (from the past 240 hour(s))  Blood Culture (routine x 2)     Status: None   Collection Time: 10/07/19  4:06 PM   Specimen: BLOOD RIGHT FOREARM  Result Value Ref Range Status   Specimen Description BLOOD RIGHT FOREARM  Final   Special Requests   Final    BOTTLES DRAWN AEROBIC AND ANAEROBIC Blood Culture results may not be optimal due to an inadequate volume of blood received in culture bottles   Culture   Final    NO GROWTH 5 DAYS Performed at [facility], [address] [place], [place] [address]    Report Status 10/12/2019 FINAL  Final  Blood Culture (routine x 2)     Status: None   Collection Time: 10/07/19  4:11 PM   Specimen: BLOOD RIGHT HAND  Result Value Ref Range Status   Specimen Description BLOOD RIGHT HAND  Final   Special Requests   Final    BOTTLES DRAWN AEROBIC AND ANAEROBIC Blood Culture adequate volume   Culture   Final    NO GROWTH 5 DAYS Performed at [facility], [address] [place], [place] [address]    Report Status 10/12/2019 FINAL  Final  SARS Coronavirus 2 by RT PCR (hospital order, performed in [facility] hospital lab) Nasopharyngeal Nasopharyngeal Swab  Status: None   Collection Time: 10/07/19  4:31 PM   Specimen: Nasopharyngeal Swab  Result Value Ref Range Status   SARS Coronavirus 2 NEGATIVE NEGATIVE Final    Comment: (NOTE) SARS-CoV-2 target nucleic acids are NOT DETECTED.  The SARS-CoV-2 RNA is generally detectable in upper and lower respiratory specimens during the acute phase of infection. The lowest concentration of SARS-CoV-2 viral copies this assay can detect is 250 copies / mL. A negative result does not preclude SARS-CoV-2 infection and should not be used as the sole basis for treatment or other patient management decisions.  A negative result may occur with improper specimen collection / handling, submission of specimen other than nasopharyngeal swab,  presence of viral mutation(s) within the areas targeted by this assay, and inadequate number of viral copies (<250 copies / mL). A negative result must be combined with clinical observations, patient history, and epidemiological information.  Fact Sheet for Patients:   BoilerBrush.com.cy  Fact Sheet for Healthcare Providers: https://pope.com/  This test is not yet approved or  cleared by the [place] FDA and has been authorized for detection and/or diagnosis of SARS-CoV-2 by FDA under an Emergency Use Authorization (EUA).  This EUA will remain in effect (meaning this test can be used) for the duration of the COVID-19 declaration under Section 564(b)(1) of the Act, 21 U.S.C. section 360bbb-3(b)(1), unless the authorization is terminated or revoked sooner.  Performed at [facility], [address], [place], [place] [address]      Medications:   . amitriptyline  150 mg Oral QHS  . ascorbic acid  500 mg Oral Daily  . atenolol  50 mg Oral Daily  . Chlorhexidine Gluconate Cloth  6 each Topical Daily  . collagenase   Topical BID  . diltiazem  120 mg Oral Daily  . donepezil  5 mg Oral QHS  . ferrous gluconate  324 mg Oral TID WC  . gabapentin  100 mg Oral TID  . levothyroxine  25 mcg Oral QAC breakfast  . lisinopril  5 mg Oral Daily  . pantoprazole  40 mg Oral BID  . sertraline  100 mg Oral Daily  . thiamine  100 mg Oral Daily  . zinc sulfate  220 mg Oral Daily   Continuous Infusions: . sodium chloride 10 mL/hr at 10/11/19 2228  . cefTRIAXone (ROCEPHIN)  IV 2 g (10/11/19 2231)  . lactated ringers 100 mL/hr at 10/12/19 0557  . vancomycin 1,000 mg (10/12/19 0551)      LOS: 5 days   Marinda Elk  [facility]  10/12/2019, 9:24 AM

## 2019-10-12 NOTE — Progress Notes (Signed)
Initial Nutrition Assessment  DOCUMENTATION CODES:   Non-severe (moderate) malnutrition in context of chronic illness  INTERVENTION:  Magic cup TID with meals, each supplement provides 290 kcal and 9 grams of protein  Vital Cuisine shake po TID, each supplement provides 500 kcal and 22 grams of protein   NUTRITION DIAGNOSIS:   Moderate Malnutrition related to chronic illness (dementia) as evidenced by mild muscle depletion, mild fat depletion.   GOAL:   Patient will meet greater than or equal to 90% of their needs    MONITOR:   PO intake, Supplement acceptance, Weight trends, Labs, I & O's, Skin, Diet advancement  REASON FOR ASSESSMENT:   Consult Wound healing  ASSESSMENT:   Pt from SNF admitted with sepsis 2/2 infected sacral decubitus ulcer. Pt underwent EGD that revealed gastritis and a few AVMs. PMH includes dementia, UTI, COVID-19 (June 2021), DM, iron deficiency anemia, HTN, sacral decubitus ulcer.  Pt undergoing hydrotherapy for wound treatment.   Pt provided no responses to RD questions. Per H&P, pt had decreased oral intake PTA, duration not specified. Pt with poor prognosis per MD. Palliative Care following. Pt's daughter struggling with disposition decision, but has stated that they do not want pt to have a feeding tube.   Discussed pt with nurse tech who reports pt did not do well with her meal today due to being excessively sleepy, but states that pt does enjoy Magic Cup supplements.  Per RN, pt refusing medications today.  Reviewed wt history. No significant wt changes noted.   PO intake: 0-100% x 4 recorded meals (25% average meal intake)  Labs reviewed. Medications: Vitamin C, Fergon, Protonix, Thiamine, Zinc sulfate  NUTRITION - FOCUSED PHYSICAL EXAM:    Most Recent Value  Orbital Region Severe depletion  Upper Arm Region Mild depletion  Thoracic and Lumbar Region No depletion  Buccal Region Moderate depletion  Temple Region Severe depletion   Clavicle Bone Region Mild depletion  Clavicle and Acromion Bone Region Mild depletion  Scapular Bone Region Mild depletion  Dorsal Hand Mild depletion  Patellar Region Mild depletion  Anterior Thigh Region Mild depletion  Posterior Calf Region Moderate depletion  Edema (RD Assessment) None  Hair Reviewed  Eyes Reviewed  Mouth Reviewed  Skin Reviewed  Nails Reviewed       Diet Order:   Diet Order            DIET - DYS 1 Room service appropriate? Yes with Assist; Fluid consistency: Nectar Thick  Diet effective now                 EDUCATION NEEDS:   Not appropriate for education at this time  Skin:  Skin Assessment: Skin Integrity Issues: Skin Integrity Issues:: Unstageable Unstageable: R/L coccyx  Last BM:  8/17 type 5  Height:   Ht Readings from Last 1 Encounters:  10/07/19 5\' 7"  (1.702 m)    Weight:   Wt Readings from Last 5 Encounters:  10/07/19 68 kg  07/13/18 72.6 kg  06/04/18 72.6 kg  05/04/18 72.6 kg  04/28/17 71.2 kg    BMI:  Body mass index is 23.48 kg/m.  Estimated Nutritional Needs:   Kcal:  1900-2100  Protein:  95-110 grams  Fluid:  >/=1.9L/d    06/28/17, MS, RD, LDN RD pager number and weekend/on-call pager number located in [place].

## 2019-10-12 NOTE — Progress Notes (Signed)
Pt very Lethargic this am. Got pt. to eat one bite of applesauce and refused to take anymore. Was unable to give morning pills due to being very sleepy and poorly responding. Nutrition consult placed per verbal permission. Will continue to monitor    Everlean Cherry, RN .

## 2019-10-12 NOTE — Progress Notes (Signed)
Pt's daughter, Yvone Neu, called by phone to verify if she can bring her husband to visit Pt at am. Hospital visiting policy and update given. All questions answered. Thurston Hole preferred her mother to go home. Thurston Hole and her husband will be caregivers for Pt instead of sending Pt to SNF or hospice. Stated she is not ready for her mom to go to that level of care. I recommeded her to discuss with MD and care team when she come to visit today. Will hand off to RN on day shift.  Pt appeared drowsy, followed commands, confused, disoriented to time , place and situations. She 's hemodynamically stable. Remained afebrile, NSR on monitor, HR 60s-70s, BP stable. SPO2 93% on room air.   Dressing changed on her sacral wound with moist to dry dressing , Santal applied,packed with damped Kerlex and covered with ABD pads and tape. Pain tolerated well. No immediate distress note.  Continue to monitor.  Filiberto Pinks, RN

## 2019-10-13 DIAGNOSIS — R1310 Dysphagia, unspecified: Secondary | ICD-10-CM

## 2019-10-13 DIAGNOSIS — F0390 Unspecified dementia without behavioral disturbance: Secondary | ICD-10-CM

## 2019-10-13 DIAGNOSIS — R531 Weakness: Secondary | ICD-10-CM

## 2019-10-13 LAB — CBC
HCT: 32.9 % — ABNORMAL LOW (ref 36.0–46.0)
Hemoglobin: 9.5 g/dL — ABNORMAL LOW (ref 12.0–15.0)
MCH: 23.9 pg — ABNORMAL LOW (ref 26.0–34.0)
MCHC: 28.9 g/dL — ABNORMAL LOW (ref 30.0–36.0)
MCV: 82.7 fL (ref 80.0–100.0)
Platelets: 671 10*3/uL — ABNORMAL HIGH (ref 150–400)
RBC: 3.98 MIL/uL (ref 3.87–5.11)
RDW: 17.3 % — ABNORMAL HIGH (ref 11.5–15.5)
WBC: 15.3 10*3/uL — ABNORMAL HIGH (ref 4.0–10.5)
nRBC: 0 % (ref 0.0–0.2)

## 2019-10-13 LAB — GLUCOSE, CAPILLARY
Glucose-Capillary: 86 mg/dL (ref 70–99)
Glucose-Capillary: 103 mg/dL — ABNORMAL HIGH (ref 70–99)

## 2019-10-13 MED ORDER — ASPIRIN EC 81 MG PO TBEC
81.0000 mg | DELAYED_RELEASE_TABLET | Freq: Every day | ORAL | Status: DC
  Administered 2019-10-13 – 2019-10-14 (×2): 81 mg via ORAL
  Filled 2019-10-13 (×2): qty 1

## 2019-10-13 MED ORDER — FUROSEMIDE 10 MG/ML IJ SOLN: 80.0000 mg | Freq: Once | INTRAMUSCULAR | Status: AC

## 2019-10-13 MED ORDER — FUROSEMIDE 10 MG/ML IJ SOLN
INTRAMUSCULAR | Status: AC
  Administered 2019-10-13: 80 mg
  Filled 2019-10-13: qty 8

## 2019-10-13 MED ORDER — BUMETANIDE 1 MG PO TABS
1.0000 mg | ORAL_TABLET | Freq: Every day | ORAL | Status: DC
  Administered 2019-10-14: 1 mg via ORAL
  Filled 2019-10-13: qty 1

## 2019-10-13 MED ORDER — POTASSIUM CHLORIDE 20 MEQ/15ML (10%) PO SOLN
40.0000 meq | Freq: Two times a day (BID) | ORAL | Status: AC
  Administered 2019-10-13 (×2): 40 meq via ORAL
  Filled 2019-10-13 (×2): qty 30

## 2019-10-13 NOTE — Progress Notes (Signed)
[facility] PROGRESS NOTE    Progress Note  Amber Bean  MGN:[id number] DOB: [birth year] DOA: 10/07/2019 PCP: Veverly Fells, MD     Brief Narrative:   Amber Bean is an 79 y.o. female past medical history of dementia UTI COVID-19 pneumonia in June 2021, diabetes mellitus iron deficiency anemia essential hypertension sacral decubitus ulcer was brought in from skilled nursing facility for worsening decubitus ulcer wound generalized weakness and decreased oral intake.  The scan of the abdomen and pelvis shows acute decubitus ulcer with multiple air collection and subcutaneous fat deep ulceration and subcutaneous abscesses.  With circumferential wall thickening of the rectum with probable proctitis, she was admitted for sepsis secondary to infected sacral decubitus ulcer neurosurgery was consulted recommended hydrotherapy.  GI was also consulted underwent EGD that revealed gastritis since a few AVMs, there was no indication for colonoscopy at that point.  She has been having trouble swallowing and swallowing evaluation was done and she was started on a dysphagia 1 diet with pured solids and nectar thick.   Is currently undergoing hydrotherapy for stage IV sacral decubitus ulcer surgery in the meantime she has been clinically deteriorating with poor oral intake and poor progression. Hospice and palliative care has been consulted.  Assessment/Plan:   Sepsis (HCC) secondary to infected sacral decubitus ulcer stage IV: She was fluid resuscitated started on broad-spectrum antibiotics. General surgery was consulted and they recommended hydrotherapy and no debridement at that point. Need a total of 10 days of empiric antibiotic she is on day 8 , continue hydrotherapy. She is having minimal oral intake, will need to speak to the family about her poor prognosis. She does not want feeding tube, the daughter is struggling with decision whether to send her to a residential facility or home  with hospice. She would like to finish the antibiotic treatment and hydrotherapy. We will ask case manager to help her with equipment for home. Check CBC  Questionable GI bleed: She status post 1 unit of packed red blood cells, status post EGD that showed gastritis and a few AVMs. GI recommended to continue PPIs.  Dementia advanced: Continue Aricept.  Hypokalemia/hypomagnesemia: Replete orally monitor intermittently now resolved. Will be restarted on her diuretic therapy follow electrolytes closely.  Essential hypertension: Well-controlled.  Diabetes mellitus type 2: With an A1c of 5.4, currently on sliding scale blood glucose fairly controlled. Patient is having minimal oral intake  Palliative care encounter: Palliative care has been consulted as as patient is having aspiration pneumonia, with poor oral intake bedbound and a stage IV sacral decubitus ulcer due to immobilization, we will need to discuss with the family her poor prognosis. Daughter has unrealistic expectation about outcome, she needs to decide whether she will want to go home with hospice. Daughter was unrealistic about expectation relating that they would like to take her home and learn to take care of her at home.   DVT prophylaxis: lovenox Family Communication:none Status is: Inpatient  Remains inpatient appropriate because:Altered mental status   Dispo: The patient is from: Home              Anticipated d/c is to: SNF              Anticipated d/c date is: 3 days              Patient currently is not medically stable to d/c.     Code Status:     Code Status Orders  (From admission, onward)  Start     Ordered   10/08/19 1458  Do not attempt resuscitation (DNR)  Continuous       Question Answer Comment  In the event of cardiac or respiratory ARREST Do not call a "code blue"   In the event of cardiac or respiratory ARREST Do not perform Intubation, CPR, defibrillation or ACLS   In the  event of cardiac or respiratory ARREST Use medication by any route, position, wound care, and other measures to relive pain and suffering. May use oxygen, suction and manual treatment of airway obstruction as needed for comfort.      10/08/19 1457        Code Status History    Date Active Date Inactive Code Status Order ID Comments User Context   10/07/2019 2038 10/08/2019 1457 Full Code [id number]  Rometta Emery, MD Inpatient   10/07/2019 2038 10/07/2019 2038 Full Code [id number]  Rometta Emery, MD Inpatient   Advance Care Planning Activity    Advance Directive Documentation     Most Recent Value  Type of Advance Directive Healthcare Power of Attorney  Pre-existing out of facility DNR order (yellow form or pink MOST form) --  "MOST" Form in Place? --        IV Access:    Peripheral IV   Procedures and diagnostic studies:   No results found.   Medical Consultants:    None.  Anti-Infectives:   IV Rocephin and vancomycin.  Subjective:    Amber Bean nonverbal this morning.  Objective:    Vitals:   10/13/19 0000 10/13/19 0530 10/13/19 0609 10/13/19 0734  BP: (!) 158/78 (!) 163/108 (!) 151/66 (!) 173/80  Pulse: 91 (!) 103 85 85  Resp: 20 20 20 14   Temp: 97.7 F (36.5 C) (!) 97.4 F (36.3 C)  97.6 F (36.4 C)  TempSrc: Axillary Axillary  Oral  SpO2: 92% 92% 92% 90%  Weight:      Height:       SpO2: 90 % O2 Flow Rate (L/min): 2 L/min   Intake/Output Summary (Last 24 hours) at 10/13/2019 0817 Last data filed at 10/13/2019 0530 Gross per 24 hour  Intake 3849.68 ml  Output 2100 ml  Net 1749.68 ml   Filed Weights   10/07/19 2100  Weight: 68 kg    Exam: General exam: In no acute distress. Respiratory system: Good air movement and clear to auscultation. Cardiovascular system: S1 & S2 heard, RRR. No JVD. Gastrointestinal system: Abdomen is nondistended, soft and nontender.  Extremities: No pedal edema. Skin: No rashes, lesions or  ulcers   Data Reviewed:    Labs: Basic Metabolic Panel: Recent Labs  Lab 10/07/19 1507 10/07/19 1507 10/07/19 2157 10/08/19 0036 10/08/19 0036 10/09/19 0145 10/09/19 0145 10/10/19 0437 10/11/19 0542  NA 139  --   --  138  --  140  --  141 138  K 3.7   < >  --  4.3   < > 3.1*   < > 3.1* 3.6  CL 103  --   --  103  --  104  --  106 103  CO2 26  --   --  26  --  25  --  25 27  GLUCOSE 122*  --   --  105*  --  83  --  77 82  BUN 12  --   --  11  --  8  --  <5* 5*  CREATININE 0.60   < >  0.45 0.54  --  0.45  --  0.47 0.51  CALCIUM 8.9  --   --  8.7*  --  8.6*  --  8.6* 8.1*  MG  --   --   --   --   --   --   --  1.5* 2.0  PHOS  --   --   --   --   --   --   --   --  2.6   < > = values in this interval not displayed.   GFR Estimated Creatinine Clearance: 57.3 mL/min (by C-G formula based on SCr of 0.51 mg/dL). Liver Function Tests: Recent Labs  Lab 10/07/19 1507 10/08/19 0036 10/09/19 0145  AST 18 20 16   ALT 32 31 22  ALKPHOS 85 82 74  BILITOT 0.4 0.5 0.5  PROT 5.9* 5.7* 5.0*  ALBUMIN 2.0* 2.0* 1.8*   No results for input(s): LIPASE, AMYLASE in the last 168 hours. No results for input(s): AMMONIA in the last 168 hours. Coagulation profile Recent Labs  Lab 10/07/19 1700 10/08/19 0036  INR 1.1 1.1   COVID-19 Labs  No results for input(s): DDIMER, FERRITIN, LDH, CRP in the last 72 hours.  Lab Results  Component Value Date   SARSCOV2NAA NEGATIVE 10/07/2019    CBC: Recent Labs  Lab 10/07/19 1507 10/07/19 1507 10/07/19 2157 10/08/19 0036 10/09/19 0145 10/10/19 0437 10/11/19 0542  WBC 14.6*   < > 20.3* 20.6* 11.7* 11.4* 13.8*  NEUTROABS 10.9*  --   --   --  7.9* 8.1* 9.8*  HGB 7.6*   < > 7.6* 7.5* 7.9* 8.0* 9.4*  HCT 27.5*   < > 27.1* 26.4* 26.2* 27.7* 31.7*  MCV 81.1   < > 81.6 80.0 81.9 82.2 82.1  PLT 837*   < > 775* 763* 702* 655* 664*   < > = values in this interval not displayed.   Cardiac Enzymes: No results for input(s): CKTOTAL, CKMB,  CKMBINDEX, TROPONINI in the last 168 hours. BNP (last 3 results) No results for input(s): PROBNP in the last 8760 hours. CBG: Recent Labs  Lab 10/07/19 1441 10/08/19 0905 10/08/19 1108 10/08/19 1614  GLUCAP 123* 90 91 84   D-Dimer: No results for input(s): DDIMER in the last 72 hours. Hgb A1c: No results for input(s): HGBA1C in the last 72 hours. Lipid Profile: No results for input(s): CHOL, HDL, LDLCALC, TRIG, CHOLHDL, LDLDIRECT in the last 72 hours. Thyroid function studies: No results for input(s): TSH, T4TOTAL, T3FREE, THYROIDAB in the last 72 hours.  Invalid input(s): FREET3 Anemia work up: No results for input(s): VITAMINB12, FOLATE, FERRITIN, TIBC, IRON, RETICCTPCT in the last 72 hours. Sepsis Labs: Recent Labs  Lab 10/07/19 1506 10/07/19 1507 10/07/19 1700 10/07/19 2157 10/07/19 2157 10/08/19 0036 10/08/19 0828 10/09/19 0145 10/10/19 0437 10/11/19 0542  PROCALCITON  --   --   --   --   --   --  <0.10  --   --   --   WBC  --    < >  --  20.3*   < > 20.6*  --  11.7* 11.4* 13.8*  LATICACIDVEN 1.2  --  2.5* 1.4  --   --   --   --   --   --    < > = values in this interval not displayed.   Microbiology Recent Results (from the past 240 hour(s))  Blood Culture (routine x 2)     Status: None  Collection Time: 10/07/19  4:06 PM   Specimen: BLOOD RIGHT FOREARM  Result Value Ref Range Status   Specimen Description BLOOD RIGHT FOREARM  Final   Special Requests   Final    BOTTLES DRAWN AEROBIC AND ANAEROBIC Blood Culture results may not be optimal due to an inadequate volume of blood received in culture bottles   Culture   Final    NO GROWTH 5 DAYS Performed at [facility], [address] [place], [place] [address]    Report Status 10/12/2019 FINAL  Final  Blood Culture (routine x 2)     Status: None   Collection Time: 10/07/19  4:11 PM   Specimen: BLOOD RIGHT HAND  Result Value Ref Range Status   Specimen Description BLOOD RIGHT HAND  Final   Special  Requests   Final    BOTTLES DRAWN AEROBIC AND ANAEROBIC Blood Culture adequate volume   Culture   Final    NO GROWTH 5 DAYS Performed at [facility], [address] [place], [place] [address]    Report Status 10/12/2019 FINAL  Final  SARS Coronavirus 2 by RT PCR (hospital order, performed in [facility] hospital lab) Nasopharyngeal Nasopharyngeal Swab     Status: None   Collection Time: 10/07/19  4:31 PM   Specimen: Nasopharyngeal Swab  Result Value Ref Range Status   SARS Coronavirus 2 NEGATIVE NEGATIVE Final    Comment: (NOTE) SARS-CoV-2 target nucleic acids are NOT DETECTED.  The SARS-CoV-2 RNA is generally detectable in upper and lower respiratory specimens during the acute phase of infection. The lowest concentration of SARS-CoV-2 viral copies this assay can detect is 250 copies / mL. A negative result does not preclude SARS-CoV-2 infection and should not be used as the sole basis for treatment or other patient management decisions.  A negative result may occur with improper specimen collection / handling, submission of specimen other than nasopharyngeal swab, presence of viral mutation(s) within the areas targeted by this assay, and inadequate number of viral copies (<250 copies / mL). A negative result must be combined with clinical observations, patient history, and epidemiological information.  Fact Sheet for Patients:   BoilerBrush.com.cy  Fact Sheet for Healthcare Providers: https://pope.com/  This test is not yet approved or  cleared by the [place] FDA and has been authorized for detection and/or diagnosis of SARS-CoV-2 by FDA under an Emergency Use Authorization (EUA).  This EUA will remain in effect (meaning this test can be used) for the duration of the COVID-19 declaration under Section 564(b)(1) of the Act, 21 U.S.C. section 360bbb-3(b)(1), unless the authorization is terminated or revoked  sooner.  Performed at [facility], [address] [place], [place] [address]      Medications:   . amitriptyline  150 mg Oral QHS  . ascorbic acid  500 mg Oral Daily  . atenolol  50 mg Oral Daily  . Chlorhexidine Gluconate Cloth  6 each Topical Daily  . collagenase   Topical BID  . diltiazem  120 mg Oral Daily  . donepezil  5 mg Oral QHS  . ferrous gluconate  324 mg Oral TID WC  . gabapentin  100 mg Oral TID  . levothyroxine  25 mcg Oral QAC breakfast  . lisinopril  5 mg Oral Daily  . pantoprazole  40 mg Oral BID  . sertraline  100 mg Oral Daily  . thiamine  100 mg Oral Daily  . zinc sulfate  220 mg Oral  Daily   Continuous Infusions: . sodium chloride 10 mL/hr at 10/12/19 1500  . cefTRIAXone (ROCEPHIN)  IV 2 g (10/12/19 2055)  . lactated ringers 100 mL/hr at 10/13/19 0100  . vancomycin 1,000 mg (10/13/19 0517)      LOS: 6 days   Marinda Elk  [facility]  10/13/2019, 8:17 AM

## 2019-10-13 NOTE — Progress Notes (Signed)
Pharmacy Antibiotic Note  Amber Bean is a 79 y.o. female admitted on 10/07/2019 with sepsis from sacral decub.  Pharmacy has been consulted for Vanco dosing.  ID: sepsis secondary to deep, mottled sacral ulcer with likely osteo- afebrile, WBC incr 13.8. Vanco #7  Vanc 8/14 >> CTX 8/14 >> Zosyn 8/14 x1  8/17 VT 11- increase vancomycin to 1000 mg IV every 12 hours  Plan: Vancomycin 1000 mg IV q12h. Plan 10d CTX 2g IV every 24h No Further Vanco levels    Height: 5\' 7"  (170.2 cm) Weight: 68 kg (149 lb 14.6 oz) (estimated) IBW/kg (Calculated) : 61.6  Temp (24hrs), Avg:97.7 F (36.5 C), Min:97.4 F (36.3 C), Max:98 F (36.7 C)  Recent Labs  Lab 10/07/19 1506 10/07/19 1507 10/07/19 1700 10/07/19 2157 10/08/19 0036 10/09/19 0145 10/10/19 0437 10/11/19 0542  WBC  --    < >  --  20.3* 20.6* 11.7* 11.4* 13.8*  CREATININE  --    < >  --  0.45 0.54 0.45 0.47 0.51  LATICACIDVEN 1.2  --  2.5* 1.4  --   --   --   --   VANCOTROUGH  --   --   --   --   --   --  11*  --    < > = values in this interval not displayed.    Estimated Creatinine Clearance: 57.3 mL/min (by C-G formula based on SCr of 0.51 mg/dL).    Allergies  Allergen Reactions  . Morphine And Related Other (See Comments) and Hives    "makes me mean as a snake"   . Nitrofurantoin Other (See Comments)    Ruptured tendin  Ruptured tendin   . Trazodone And Nefazodone Rash  . Contrast Media [Iodinated Diagnostic Agents] Other (See Comments)    AFIB  . Morphine     agression  . Ciprocin-Fluocin-Procin  [Fluocinolone] Rash  . Ciprofloxacin Rash    Sunshyne Horvath S. 10/13/19, PharmD, BCPS Clinical Staff Pharmacist [email or web address]  Merilynn Finland 10/13/2019 11:09 AM

## 2019-10-13 NOTE — Progress Notes (Signed)
Daily Progress Note   Patient Name: Amber Bean       Date: 10/13/2019 DOB: Jan 07, 1941  Age: 79 y.o. MRN#: [id number] Attending Physician: Marinda Elk, MD Primary Care Physician: Veverly Fells, MD Admit Date: 10/07/2019   Reason for Consultation/Follow-up: Establishing goals of care  Subjective: Patient drowsy. She will open eyes to voice but does not engage in discussion. Confused with baseline dementia. Does not appear to be in pain or discomfort.   GOC:  Daughter, Thurston Hole at bedside. Follow-up from our conversation yesterday. Again reviewed course of hospitalization including diagnoses, interventions, plan of care. Discussed dementia disease trajectory. Frankly and compassionately discussed poor prognosis. Although Thurston Hole struggles with the thought of losing her mother, she does seem to understand her mother's rapidly declining health and wishes to take her home, 'as soon as possible.' After further discussions, Thurston Hole is agreeable with home hospice services. We discussed comfort feeds and ongoing assist/encouragement with meals. Thurston Hole would like her mother to continue oral antibiotics. I explained that hospice can continue oral antibiotics but concern that it will continue to be challenging to get her mother to take pills as she nears EOL. She is hesitant for her mother to be 'pumped with morphine.' Explained that hospice services are not here to hasten death but to ensure comfort and peace as her mother nears EOL. Encouraged Thurston Hole to explain her fear to hospice staff but also that she will be her mother's primary caregiver and managing her medications. Does not need to give medication for pain if she does not believe her mother is in pain or uncomfortable.   Answered questions and  concerns. Therapeutic listening and emotional/spiritual support provided.   Discussed with RN and Silva Bandy, RN CM.    Length of Stay: 6  Current Medications: Scheduled Meds:  . amitriptyline  150 mg Oral QHS  . ascorbic acid  500 mg Oral Daily  . aspirin EC  81 mg Oral Daily  . atenolol  50 mg Oral Daily  . [START ON 10/14/2019] bumetanide  1 mg Oral Daily  . Chlorhexidine Gluconate Cloth  6 each Topical Daily  . collagenase   Topical BID  . diltiazem  120 mg Oral Daily  . donepezil  5 mg Oral QHS  . ferrous gluconate  324 mg Oral TID WC  .  furosemide      . furosemide  80 mg Intravenous Once  . gabapentin  100 mg Oral TID  . levothyroxine  25 mcg Oral QAC breakfast  . lisinopril  5 mg Oral Daily  . pantoprazole  40 mg Oral BID  . potassium chloride  40 mEq Oral BID  . sertraline  100 mg Oral Daily  . thiamine  100 mg Oral Daily  . zinc sulfate  220 mg Oral Daily    Continuous Infusions: . sodium chloride 10 mL/hr at 10/12/19 1500  . cefTRIAXone (ROCEPHIN)  IV 2 g (10/12/19 2055)  . vancomycin 1,000 mg (10/13/19 0517)    PRN Meds: acetaminophen, nitroGLYCERIN, ondansetron **OR** ondansetron (ZOFRAN) IV, Resource ThickenUp Clear  Physical Exam Vitals and nursing note reviewed.  Constitutional:      Appearance: She is cachectic. She is ill-appearing.  HENT:     Head: Normocephalic and atraumatic.  Cardiovascular:     Heart sounds: Normal heart sounds.  Pulmonary:     Effort: No tachypnea, accessory muscle usage or respiratory distress.     Breath sounds: Normal breath sounds.  Skin:    General: Skin is warm and dry.  Neurological:     Comments: Drowsy, Baseline dementia  Psychiatric:        Attention and Perception: She is inattentive.        Cognition and Memory: Cognition is impaired.            Vital Signs: BP (!) 173/80 (BP Location: Left Arm)   Pulse 85   Temp 97.6 F (36.4 C) (Oral)   Resp 14   Ht 5\' 7"  (1.702 m)   Wt 68 kg Comment: estimated  SpO2  90%   BMI 23.48 kg/m  SpO2: SpO2: 90 % O2 Device: O2 Device: Room Air O2 Flow Rate: O2 Flow Rate (L/min): 2 L/min  Intake/output summary:   Intake/Output Summary (Last 24 hours) at 10/13/2019 10/15/2019 Last data filed at 10/13/2019 0530 Gross per 24 hour  Intake 3849.68 ml  Output 2100 ml  Net 1749.68 ml   LBM: Last BM Date: 10/10/19 Baseline Weight: Weight: 68 kg (estimated) Most recent weight: Weight: 68 kg (estimated)       Palliative Assessment/Data: PPS 30%      Patient Active Problem List   Diagnosis Date Noted  . Dementia without behavioral disturbance (HCC)   . Weakness   . Palliative care by specialist   . Goals of care, counseling/discussion   . DNR (do not resuscitate)   . Sacral decubitus ulcer, stage IV (HCC) 10/08/2019  . Sepsis (HCC) 10/07/2019  . Coronary artery disease involving native coronary artery of native heart without angina pectoris 07/13/2018  . Essential hypertension 07/13/2018  . Dyslipidemia 07/13/2018  . Edema 07/13/2018  . Educated about COVID-19 virus infection 07/13/2018  . Hyperlipidemia 08/06/2010  . Dysphagia 04/03/2010  . ANEMIA-IRON DEFICIENCY 10/07/2009  . GERD 10/07/2009  . KIDNEY DISEASE 10/07/2009  . Chronic back pain 10/07/2009  . TOBACCO ABUSE 02/27/2009  . Mitral valve disease 02/27/2009  . CAROTID ARTERY STENOSIS 02/27/2009  . Hypothyroidism 12/31/2008  . T2DM (type 2 diabetes mellitus) (HCC) 12/31/2008  . CAD (coronary artery disease) 12/31/2008  . PANCREATITIS 12/31/2008  . SLEEP APNEA 12/31/2008    Palliative Care Assessment & Plan   Patient Profile: Per intake H&P --> 13/09/2008 E Neelyis a 79 y.o.femalewith medical history significant ofdementia, UTI, COVID-19 pneumonia in June, diabetes, previous tobacco abuse, iron deficiency anemia coronary artery disease, hypertension, [facility]ral  [facility] Patient has been there for the  time until the daughter went to visit today and found her in the bad shape. Her mother was weak debilitated and deep sacral decubitus ulcer. She noticed that they have been changing the dressing but also has been having darker stools with suspected GI bleed. She is weak debilitated and worse this year last saw her. She brought her to the ER where she was seen and evaluated. Patient found to be septic by criteria with a deep sacral decubitus ulcer stage IV as well as guaiac positive stool and anemia.  Palliative care was asked to address goals of care in the setting of GIB and sacral wound.   Assessment: Sepsis Infected sacral decubitus ulcer Dementia GI bleeding T2DM Dysphagia Deconditioning  Recommendations/Plan: MOST form completed 8/15. DNR/DNI, ABX/IVF if indicated, NO feeding tube. Continue current plan of care and medical management including hydrotherapy, wound care, and antibiotics.  Continue dysphagia diet. Patient need assist and encouragement with meals.  After further discussions, daughter is agreeable with home hospice service. RN CM notified. Will need DME. Daughter does wish to continue oral antibiotics on discharge.   Code Status: DNR/DNI   Code Status Orders  (From admission, onward)         Start     Ordered   10/08/19 1458  Do not attempt resuscitation (DNR)  Continuous       Question Answer Comment  In the event of cardiac or respiratory ARREST Do not call a "code blue"   In the event of cardiac or respiratory ARREST Do not perform Intubation, CPR, defibrillation or ACLS   In the event of cardiac or respiratory ARREST Use medication by any route, position, wound care, and other measures to relive pain and suffering. May use oxygen, suction and manual treatment of airway obstruction as needed for comfort.      10/08/19 1457        Code Status History    Date Active Date Inactive Code Status Order ID Comments User Context   10/07/2019 2038 10/08/2019 1457  Full Code [id number]  Rometta Emery, MD Inpatient   10/07/2019 2038 10/07/2019 2038 Full Code [id number]  Rometta Emery, MD Inpatient   Advance Care Planning Activity    Advance Directive Documentation     Most Recent Value  Type of Advance Directive Healthcare Power of Attorney  Pre-existing out of facility DNR order (yellow form or pink MOST form) --  "MOST" Form in Place? --      Prognosis:  Poor long-term  Discharge Planning: Home with Hospice  Care plan was discussed with RN, daughter Thurston Hole), RN CM  Thank you for allowing the Palliative Medicine Team to assist in the care of this patient.   Total Time 40 Prolonged Time Billed no      Greater than 50% of this time was spent counseling and coordinating care related to the above assessment and plan.   Vennie Homans, DNP, FNP-C Palliative Medicine Team  Phone: [phone] Fax: [phone]  Please contact Palliative Medicine Team phone at [phone] for questions and concerns.

## 2019-10-13 NOTE — TOC Initial Note (Signed)
Transition of Care (TOC) - Initial/Assessment Note  Donn Pierini RN, BSN Transitions of Care Unit 4E- RN Case Manager See Treatment Team for direct phone #    Patient Details  Name: Amber Bean MRN: [id number] Date of Birth: May 02, 1940  Transition of Care [facility]) CM/SW Contact:    Darrold Span, RN Phone Number: 10/13/2019, 5:01 PM  Clinical Narrative:                 Pt admitted from SNF with sepsis secondary to sacral wound. PC consulted for GOC- per [facility] team plan for home with hospice per daughter- spoke with daughter Thurston Hole here at bedside to discuss transition plan. Provided choice for Home hospice - Per CMS guidelines from medicare.gov website with star ratings (copy placed in shadow chart)- per choice she has selected [facility] for [facility] needs.  Also discussed home DME needs- per daughter they have hospital bed- but due to sacral wound may need overlay for bed, also has questions about other DME such as lift. -will have hospice speak with her about this.  Address confirmed with daughter in epic as address that pt will be going to  [address] [place] [address] Thurston Hole- cell - [phone]  Will plan to to transport via EMS, GOLD DNR on shadow chart.   Call made to [facility]- intake RN in meeting- faxed needed paperwork for review [phone]) and f/u call made later,  spoke with Judeth Cornfield- who has reviewed and they will accept pt under Hospice services- DME arrangements have been made with [facility] for delivery later today. Judeth Cornfield request pt be discharged as early as possible in am to facilitate Hospice admission in the Home- Have notified Attending MD of this request.   Please fax d/c summary to Hospice at [phone]   Expected Discharge Plan: Home w Hospice Care Barriers to Discharge: No Barriers Identified   Patient Goals and CMS Choice Patient states their goals for this hospitalization and ongoing  recovery are:: "take momma home" CMS Medicare.gov Compare Post Acute Care list provided to:: Patient Represenative (must comment) (daughter) Choice offered to / list presented to : Adult Children  Expected Discharge Plan and Services Expected Discharge Plan: Home w Hospice Care   Discharge Planning Services: CM Consult Post Acute Care Choice: Hospice Living arrangements for the past 2 months: Mobile Home                 DME Arranged: Other see comment (per hospice) DME Agency: [facility] Date DME Agency Contacted: 10/13/19 Time DME Agency Contacted:  (contacted per Hospice)   HH Arranged: Disease Management (Hospice) HH Agency: [facility] Date [facility] Agency Contacted: 10/13/19 Time HH Agency Contacted: 1300 Representative spoke with at [facility] Agency: Casandra/Stephanie  Prior Living Arrangements/Services Living arrangements for the past 2 months: Mobile Home Lives with:: Adult Children Patient language and need for interpreter reviewed:: Yes Do you feel safe going back to the place where you live?: Yes      Need for Family Participation in Patient Care: Yes (Comment) Care giver support system in place?: Yes (comment) Current home services: DME Criminal Activity/Legal Involvement Pertinent to Current Situation/Hospitalization: No - Comment as needed  Activities of Daily Living Home Assistive Devices/Equipment: None ADL Screening (condition at time of admission) Patient's cognitive ability adequate to safely complete daily activities?: Yes Is the patient deaf or have difficulty hearing?: No Does the patient have difficulty seeing, even when wearing glasses/contacts?: No Does  the patient have difficulty concentrating, remembering, or making decisions?: Yes Patient able to express need for assistance with ADLs?: Yes Does the patient have difficulty dressing or bathing?: Yes Independently performs ADLs?: No Communication: Independent Dressing (OT): Dependent Is this  a change from baseline?: Pre-admission baseline Grooming: Dependent Is this a change from baseline?: Pre-admission baseline Feeding: Dependent Is this a change from baseline?: Pre-admission baseline Bathing: Dependent Is this a change from baseline?: Pre-admission baseline Toileting: Dependent Is this a change from baseline?: Pre-admission baseline In/Out Bed: Dependent Is this a change from baseline?: Pre-admission baseline Walks in Home: Dependent Is this a change from baseline?: Pre-admission baseline Does the patient have difficulty walking or climbing stairs?: Yes Weakness of Legs: None Weakness of Arms/Hands: None  Permission Sought/Granted Permission sought to share information with : Oceanographer granted to share information with : Yes, Verbal Permission Granted  Share Information with NAME: Thurston Hole  Permission granted to share info w AGENCY: Hospice  Permission granted to share info w Relationship: daughter     Emotional Assessment Appearance:: Appears older than stated age Attitude/Demeanor/Rapport: Unable to Assess Affect (typically observed): Unable to Assess Orientation: : Oriented to Self Alcohol / Substance Use: Not Applicable Psych Involvement: No (comment)  Admission diagnosis:  Melena [K92.1] Sacral wound, initial encounter [S31.000A] Sepsis (HCC) [A41.9] Anemia, unspecified type [D64.9] Sepsis without acute organ dysfunction, due to unspecified organism [facility]) [A41.9] Patient Active Problem List   Diagnosis Date Noted  . Dementia without behavioral disturbance (HCC)   . Weakness   . Palliative care by specialist   . Goals of care, counseling/discussion   . DNR (do not resuscitate)   . Sacral decubitus ulcer, stage IV (HCC) 10/08/2019  . Sepsis (HCC) 10/07/2019  . Coronary artery disease involving native coronary artery of native heart without angina pectoris 07/13/2018  . Essential hypertension 07/13/2018  . Dyslipidemia  07/13/2018  . Edema 07/13/2018  . Educated about COVID-19 virus infection 07/13/2018  . Hyperlipidemia 08/06/2010  . Dysphagia 04/03/2010  . ANEMIA-IRON DEFICIENCY 10/07/2009  . GERD 10/07/2009  . KIDNEY DISEASE 10/07/2009  . Chronic back pain 10/07/2009  . TOBACCO ABUSE 02/27/2009  . Mitral valve disease 02/27/2009  . CAROTID ARTERY STENOSIS 02/27/2009  . Hypothyroidism 12/31/2008  . T2DM (type 2 diabetes mellitus) (HCC) 12/31/2008  . CAD (coronary artery disease) 12/31/2008  . PANCREATITIS 12/31/2008  . SLEEP APNEA 12/31/2008   PCP:  Veverly Fells, MD Pharmacy:   [facility] [place] - [address] [place] [address] Phone: [phone] Fax: [phone]     Social Determinants of Health (SDOH) Interventions    Readmission Risk Interventions No flowsheet data found.

## 2019-10-13 NOTE — Progress Notes (Signed)
Plan of care reviewed.  I spoke with Thurston Hole, Pt's daughter who is a primary care giver for Pt at home after Pt released from hospital. All questions answered, and she expressed appreciations.    Thurston Hole concerned and seemed like she did not have clearly understanding about the level of care for her mother. She asked if her mother can qualify for home health for wound care and for air mattress at home. Pt and Thurston Hole agreed to go back home, not to SNF.    I will hand off to a day shift RN to refer her to a case manager to clarify with Pt's insurance.   Pt appeared alert and oriented to herself and her daughter, disoriented to time, place and situations. She picked and pulled monitor and IV dressing repeatedly. Pt's hemodynamically stable. HR 80s-90s, NSR on monitor. BP stable. On room air, SPO2 92-96%, RR 18-20. No immediate distress noted.   Pt 's on dysphagia 1 diet with Nectar thick, but I recommended honey thick with no strew due to observing Pt coughed most of the time when she drank nectar thick with or without strew, and HOB up more than 45 degrees.. She had no problems when took pills crushed and served with apple sauce.   Sacral wound dressing changed BID with moist to dry dressing , Santal applied,packed with damped Kerlex and covered with ABD pads and tape. Pain tolerated well. Will continue to monitor.  Filiberto Pinks, RN

## 2019-10-13 NOTE — Progress Notes (Signed)
Physical Therapy Wound Treatment Patient Details  Name: OLIANA GOWENS MRN: [id number] Date of Birth: 02/08/1941  Today's Date: 10/13/2019 Time: 1210-1240 Time Calculation (min): 30 min  Subjective  Subjective: Pt sleeping through most of treatment Patient and Family Stated Goals: unable to state Date of Onset:  (unknown) Prior Treatments: dressing  Pain Score:  2/10  Wound Assessment  Pressure Injury 10/07/19 Coccyx Right;Left;Medial Unstageable - Full thickness tissue loss in which the base of the injury is covered by slough (yellow, tan, gray, green or brown) and/or eschar (tan, brown or black) in the wound bed. (Active)  Wound Image   10/09/19 1500  Dressing Type ABD;Barrier Film (skin prep);Gauze (Comment) 10/13/19 1306  Dressing Changed 10/13/19 1306  Dressing Change Frequency Twice a day 10/13/19 1306  State of Healing Early/partial granulation 10/13/19 1306  Site / Wound Assessment Granulation tissue;Yellow;Black 10/13/19 1306  % Wound base Red or Granulating 40% 10/13/19 1306  % Wound base Yellow/Fibrinous Exudate 50% 10/13/19 1306  % Wound base Black/Eschar 45% 10/13/19 1306  % Wound base Other/Granulation Tissue (Comment) 10% 10/13/19 1306  Peri-wound Assessment Erythema (blanchable) 10/13/19 1306  Wound Length (cm) 8.7 cm 10/10/19 1300  Wound Width (cm) 9.4 cm 10/10/19 1300  Wound Depth (cm) 3.4 cm 10/10/19 1300  Wound Surface Area (cm^2) 81.78 cm^2 10/10/19 1300  Wound Volume (cm^3) 278.05 cm^3 10/10/19 1300  Undermining (cm) 1.4 at 12 o clock and 5.1 at 3 o clock 10/12/19 1125  Margins Unattached edges (unapproximated) 10/13/19 1306  Drainage Amount Moderate 10/13/19 1306  Drainage Description Serosanguineous 10/13/19 1306  Treatment Debridement (Selective);Hydrotherapy (Pulse lavage);Packing (Saline gauze) 10/13/19 1306   Hydrotherapy Pulsed lavage therapy - wound location: sacrum Pulsed Lavage with Suction (psi): 12 psi Pulsed Lavage with Suction - Normal  Saline Used: 1000 mL Pulsed Lavage Tip: Tip with splash shield Selective Debridement Selective Debridement - Location: sacrum  Selective Debridement - Tools Used: Forceps;Scalpel;Scissors Selective Debridement - Tissue Removed: brown eschar, brown gelatinous necrotic tissue   Wound Assessment and Plan  Wound Therapy - Assess/Plan/Recommendations Wound Therapy - Clinical Statement: Some portions of wound improving with red granulation tissue. Pt will continue to benefit from pulse lavage and selective debridement to reduce bioburden and facilitate wound healing.  Wound Therapy - Functional Problem List: decrease mobility Factors Delaying/Impairing Wound Healing: Multiple medical problems;Immobility Hydrotherapy Plan: Debridement;Pulsatile lavage with suction Wound Therapy - Frequency: 6X / week Wound Therapy - Follow Up Recommendations: Skilled nursing facility Wound Plan: see above  Wound Therapy Goals- Improve the function of patient's integumentary system by progressing the wound(s) through the phases of wound healing (inflammation - proliferation - remodeling) by: Decrease Necrotic Tissue to: 70 Decrease Necrotic Tissue - Progress: Progressing toward goal Increase Granulation Tissue to: 30 Increase Granulation Tissue - Progress: Progressing toward goal Improve Drainage Characteristics: Min Improve Drainage Characteristics - Progress: Progressing toward goal Goals/treatment plan/discharge plan were made with and agreed upon by patient/family: No, Patient unable to participate in goals/treatment/discharge plan and family unavailable Time For Goal Achievement: 7 days Wound Therapy - Potential for Goals: Fair  Goals will be updated until maximal potential achieved or discharge criteria met.  Discharge criteria: when goals achieved, discharge from hospital, MD decision/surgical intervention, no progress towards goals, refusal/missing three consecutive treatments without notification or  medical reason.  GP       Wyona Almas, PT, DPT Acute Rehabilitation Services Pager [phone] Office [phone]   Deno Etienne 10/13/2019, 1:12 PM

## 2019-10-14 DIAGNOSIS — F0150 Vascular dementia without behavioral disturbance: Secondary | ICD-10-CM

## 2019-10-14 DIAGNOSIS — E038 Other specified hypothyroidism: Secondary | ICD-10-CM

## 2019-10-14 LAB — CBC
HCT: 30.7 % — ABNORMAL LOW (ref 36.0–46.0)
Hemoglobin: 8.9 g/dL — ABNORMAL LOW (ref 12.0–15.0)
MCH: 24.1 pg — ABNORMAL LOW (ref 26.0–34.0)
MCHC: 29 g/dL — ABNORMAL LOW (ref 30.0–36.0)
MCV: 83.2 fL (ref 80.0–100.0)
Platelets: 598 10*3/uL — ABNORMAL HIGH (ref 150–400)
RBC: 3.69 MIL/uL — ABNORMAL LOW (ref 3.87–5.11)
RDW: 17.7 % — ABNORMAL HIGH (ref 11.5–15.5)
WBC: 14.1 10*3/uL — ABNORMAL HIGH (ref 4.0–10.5)
nRBC: 0 % (ref 0.0–0.2)

## 2019-10-14 LAB — BASIC METABOLIC PANEL
Anion gap: 8 (ref 5–15)
BUN: 5 mg/dL — ABNORMAL LOW (ref 8–23)
CO2: 30 mmol/L (ref 22–32)
Calcium: 8.8 mg/dL — ABNORMAL LOW (ref 8.9–10.3)
Chloride: 103 mmol/L (ref 98–111)
Creatinine, Ser: 0.45 mg/dL (ref 0.44–1.00)
GFR calc Af Amer: >60 mL/min (ref >60)
GFR calc non Af Amer: >60 mL/min (ref >60)
Glucose, Bld: 86 mg/dL (ref 70–99)
Potassium: 3.9 mmol/L (ref 3.5–5.1)
Sodium: 141 mmol/L (ref 135–145)

## 2019-10-14 MED ORDER — ZINC SULFATE 220 (50 ZN) MG PO CAPS: 220.0000 mg | ORAL_CAPSULE | Freq: Every day | ORAL | 0 refills | Status: AC

## 2019-10-14 MED ORDER — RESOURCE THICKENUP CLEAR PO POWD: ORAL | 0 refills | Status: AC

## 2019-10-14 MED ORDER — PANTOPRAZOLE SODIUM 40 MG PO TBEC: 40.0000 mg | DELAYED_RELEASE_TABLET | Freq: Two times a day (BID) | ORAL | 0 refills | Status: AC

## 2019-10-14 MED ORDER — ONDANSETRON HCL 4 MG PO TABS: 4.0000 mg | ORAL_TABLET | Freq: Four times a day (QID) | ORAL | 0 refills | Status: AC | PRN

## 2019-10-14 NOTE — TOC Transition Note (Signed)
Transition of Care [facility]) - CM/SW Discharge Note   Patient Details  Name: Amber Bean MRN: [id number] Date of Birth: 1940-08-09  Transition of Care [facility]) CM/SW Contact:  Bess Kinds, RN Phone Number: [phone] 10/14/2019, 10:11 AM   Clinical Narrative:     Sherron Monday with MD about patient transitioning home with hospice services today. DC summary faxed to [facility] [phone]. Spoke with Kendal Hymen at [facility]. Patient will be admitted to hospice services today. Spoke with patient's daughter, Thurston Hole, who is ready for patient to be transported home. PTAR arranged. Medical transport papers and facesheet sent to department printer. RN notified and aware of pending transport. No further TOC needs identified at this time.   Final next level of care: Home w Hospice Care Barriers to Discharge: No Barriers Identified   Patient Goals and CMS Choice Patient states their goals for this hospitalization and ongoing recovery are:: "take momma home" CMS Medicare.gov Compare Post Acute Care list provided to:: Patient Represenative (must comment) Thurston Hole (daughter)) Choice offered to / list presented to : Adult Children  Discharge Placement                Patient to be transferred to facility by: PTAR Name of family member notified: Kendal Hymen Patient and family notified of of transfer: 10/14/19  Discharge Plan and Services   Discharge Planning Services: CM Consult Post Acute Care Choice: Hospice          DME Arranged: Other see comment (per hospice) DME Agency: [facility] Date DME Agency Contacted: 10/13/19 Time DME Agency Contacted:  (contacted per Hospice)   HH Arranged: RN HH Agency: [facility] Date HH Agency Contacted: 10/14/19 Time HH Agency Contacted: 1011 Representative spoke with at [facility] Agency: Kendal Hymen  Social Determinants of Health (SDOH) Interventions     Readmission Risk Interventions No flowsheet data found.

## 2019-10-14 NOTE — Progress Notes (Signed)
Discharge instructions given to daughter over the phone. Medications, wound care, and foley care reviewed. All questions answered. IV removed clean and intact. PTAR to transport pt home.  Versie Starks, RN

## 2019-10-14 NOTE — Discharge Summary (Signed)
Physician Discharge Summary  Amber Bean VQQ:[id number] DOB: November 15, 1940 DOA: 10/07/2019  PCP: Jaynee Eagles, MD  Admit date: 10/07/2019 Discharge date: 10/14/2019  Time spent: 30 minutes  Recommendations for Outpatient Follow-up:  Covid vaccination;  Severe Sepsis ([place]) secondary to infected sacral decubitus ulcer stage IV: -On admission patient met criteria for sepsis WBC> 12, RR> 20, lactic acid on admission> 2 -She was fluid resuscitated started on broad-spectrum antibiotics. -General surgery was consulted and they recommended hydrotherapy and no debridement at that point. -Need a total of 10 days of empiric antibiotic she is on day 8 , continue hydrotherapy. -She is having minimal oral intake, will need to speak to the family about her poor prognosis. She does not want feeding tube, the daughter is struggling with decision whether to send her to a residential facility or home with hospice. -Patient's family has decided home with hospice  Pressure Injury 10/07/19 Coccyx Right;Left;Medial Unstageable - Full thickness tissue loss in which the base of the injury is covered by slough (yellow, tan, gray, green or brown) and/or eschar (tan, brown or black) in the wound bed. (Active)  10/07/19 2300  Location: Coccyx  Location Orientation: Right;Left;Medial  Staging: Unstageable - Full thickness tissue loss in which the base of the injury is covered by slough (yellow, tan, gray, green or brown) and/or eschar (tan, brown or black) in the wound bed.  Wound Description (Comments):   Present on Admission: Yes        Questionable GI bleed: -8/14 transfuse 1 unit PRBC -S/p EGD;  showed gastritis and a few AVMs. -GI recommended Protonix 40 mgBID.  Dementia advanced: -Donepezil 5 mg daily   Hypokalemia -Resolved  Hypomagnesium  -Have not monitored since 8/18  Essential hypertension: -Fairly well controlled -Atenolol 50 mg daily -Cardizem CD 120 mg daily -Lisinopril 5 mg  daily.  Diabetes mellitus type 2: -8/16 hemoglobin A1c 5.4 with an A1c of 5.4,  -Patient with minimal oral intake will allow hospice to decide if/when patient requires any insulin coverage.    Palliative care encounter: Palliative care has been consulted as as patient is having aspiration pneumonia, with poor oral intake bedbound and a stage IV sacral decubitus ulcer due to immobilization, we will need to discuss with the family her poor prognosis. -Per NCM Steffanie Dunn notes daughter has selected [facility] for [facility] needs.    Discharge Diagnoses:  Principal Problem:   Sepsis ([place]) Active Problems:   Hypothyroidism   T2DM (type 2 diabetes mellitus) ([place])   ANEMIA-IRON DEFICIENCY   CAD (coronary artery disease)   Mitral valve disease   Dysphagia   Coronary artery disease involving native coronary artery of native heart without angina pectoris   Essential hypertension   Dyslipidemia   Sacral decubitus ulcer, stage IV ([place])   Palliative care by specialist   Goals of care, counseling/discussion   DNR (do not resuscitate)   Dementia without behavioral disturbance ([place])   Weakness   Discharge Condition: Guarded  Diet recommendation: Dysphagia 1 fluid consistency nectar thick  Filed Weights   10/07/19 2100  Weight: 68 kg    History of present illness:   79 y.o. WF PMHx dementia UTI COVID-19 pneumonia in June 2021, diabetes mellitus iron deficiency anemia essential hypertension sacral decubitus ulcer was brought in from skilled nursing facility for worsening decubitus ulcer wound generalized weakness and decreased oral intake.  The scan of the abdomen and pelvis shows acute decubitus ulcer with multiple air collection and subcutaneous fat deep  ulceration and subcutaneous abscesses.  With circumferential wall thickening of the rectum with probable proctitis, she was admitted for sepsis secondary to infected sacral decubitus ulcer neurosurgery was consulted  recommended hydrotherapy.  GI was also consulted underwent EGD that revealed gastritis since a few AVMs, there was no indication for colonoscopy at that point.  She has been having trouble swallowing and swallowing evaluation was done and she was started on a dysphagia 1 diet with pured solids and nectar thick.   Is currently undergoing hydrotherapy for stage IV sacral decubitus ulcer surgery in the meantime she has been clinically deteriorating with poor oral intake and poor progression. Hospice and palliative care has been consulted.   Hospital Course:  See above  Procedures: EGD  Consultations: GI Palliative care  Cultures   8/14 SARS coronavirus negative  Antibiotics Anti-infectives (From admission, onward)   Start     Ordered Stop   10/10/19 1900  vancomycin (VANCOREADY) IVPB 1250 mg/250 mL  Status:  Discontinued        10/10/19 0815 10/10/19 0819   10/10/19 1900  vancomycin (VANCOCIN) IVPB 1000 mg/200 mL premix        10/10/19 0819     10/08/19 0500  vancomycin (VANCOREADY) IVPB 750 mg/150 mL  Status:  Discontinued        10/07/19 1952 10/10/19 0815   10/07/19 2230  piperacillin-tazobactam (ZOSYN) IVPB 3.375 g  Status:  Discontinued        10/07/19 1952 10/07/19 2127   10/07/19 2200  cefTRIAXone (ROCEPHIN) 2 g in sodium chloride 0.9 % 100 mL IVPB        10/07/19 2037     10/07/19 2045  vancomycin (VANCOCIN) IVPB 1000 mg/200 mL premix  Status:  Discontinued        10/07/19 2037 10/07/19 2055   10/07/19 1615  piperacillin-tazobactam (ZOSYN) IVPB 3.375 g        10/07/19 1610 10/07/19 1710   10/07/19 1615  vancomycin (VANCOREADY) IVPB 1500 mg/300 mL        10/07/19 1610 10/07/19 1845       Discharge Exam: Vitals:   10/13/19 2310 10/13/19 2313 10/14/19 0323 10/14/19 0731  BP: (!) 130/108 (!) 130/108  (!) 147/87  Pulse: 75 75  79  Resp: $Remo'18 18 20 16  'AlBvi$ Temp: 98.5 F (36.9 C) 98.5 F (36.9 C) (!) 97.4 F (36.3 C) 98.2 F (36.8 C)  TempSrc: Axillary Oral Axillary Oral   SpO2: 94% 94%  97%  Weight:      Height:        General exam: In no acute distress. Respiratory system: Good air movement and clear to auscultation. Cardiovascular system: S1 & S2 heard, RRR. No JVD. Gastrointestinal system: Abdomen is nondistended, soft and nontender.  Extremities: No pedal edema.      Discharge Instructions   Allergies as of 10/14/2019      Reactions   Morphine And Related Other (See Comments), Hives   "makes me mean as a snake"   Nitrofurantoin Other (See Comments)   Ruptured tendin  Ruptured tendin    Trazodone And Nefazodone Rash   Contrast Media [iodinated Diagnostic Agents] Other (See Comments)   AFIB   Morphine    agression   Ciprocin-fluocin-procin  [fluocinolone] Rash   Ciprofloxacin Rash      Medication List    STOP taking these medications   ALPRAZolam 0.5 MG tablet Commonly known as: XANAX   baclofen 10 MG tablet Commonly known as: LIORESAL   dicyclomine  10 MG capsule Commonly known as: BENTYL   meclizine 25 MG tablet Commonly known as: ANTIVERT   methocarbamol 750 MG tablet Commonly known as: ROBAXIN   Oxycodone HCl 10 MG Tabs   potassium chloride SA 20 MEQ tablet Commonly known as: KLOR-CON   risperiDONE 0.5 MG tablet Commonly known as: RISPERDAL   risperiDONE 1 MG tablet Commonly known as: RISPERDAL   rOPINIRole 1 MG tablet Commonly known as: REQUIP   sodium hypochlorite 0.125 % Soln Commonly known as: DAKIN'S 1/4 STRENGTH   zinc gluconate 50 MG tablet     TAKE these medications   acetaminophen 325 MG tablet Commonly known as: TYLENOL Take 650 mg by mouth every 6 (six) hours as needed for mild pain.   amitriptyline 150 MG tablet Commonly known as: ELAVIL Take 150 mg by mouth at bedtime. What changed: Another medication with the same name was removed. Continue taking this medication, and follow the directions you see here.   ascorbic acid 500 MG tablet Commonly known as: VITAMIN C Take 500 mg by mouth  daily.   aspirin 81 MG tablet Take 81 mg by mouth daily as needed for mild pain. What changed: Another medication with the same name was removed. Continue taking this medication, and follow the directions you see here.   atenolol 50 MG tablet Commonly known as: TENORMIN TAKE 1.5 TABLETS (75 MG TOTAL) BY MOUTH DAILY. KEEP OV. What changed:   how much to take  additional instructions   bumetanide 1 MG tablet Commonly known as: BUMEX TAKE (1) TABLET BY MOUTH TWICE A DAY IF NEEDED.   collagenase ointment Commonly known as: SANTYL Apply 1 application topically daily.   diltiazem 120 MG 24 hr capsule Commonly known as: CARDIZEM CD Take 120 mg by mouth daily.   donepezil 5 MG tablet Commonly known as: ARICEPT Take 5 mg by mouth at bedtime.   ferrous gluconate 324 MG tablet Commonly known as: FERGON Take 324 mg by mouth 3 (three) times daily with meals.   gabapentin 100 MG capsule Commonly known as: NEURONTIN Take 100 mg by mouth 3 (three) times daily. What changed: Another medication with the same name was removed. Continue taking this medication, and follow the directions you see here.   levothyroxine 25 MCG tablet Commonly known as: SYNTHROID Take 25 mcg by mouth daily before breakfast. What changed: Another medication with the same name was removed. Continue taking this medication, and follow the directions you see here.   lisinopril 5 MG tablet Commonly known as: ZESTRIL Take 5 mg by mouth daily. What changed: Another medication with the same name was removed. Continue taking this medication, and follow the directions you see here.   nitroGLYCERIN 0.4 MG SL tablet Commonly known as: NITROSTAT Place 1 tablet (0.4 mg total) under the tongue as needed.   ondansetron 4 MG tablet Commonly known as: ZOFRAN Take 1 tablet (4 mg total) by mouth every 6 (six) hours as needed for nausea.   pantoprazole 40 MG tablet Commonly known as: PROTONIX Take 1 tablet (40 mg total)  by mouth 2 (two) times daily. What changed:   medication strength  how much to take  when to take this   Resource ThickenUp Clear Powd Use as directed   sertraline 100 MG tablet Commonly known as: ZOLOFT Take 100 mg by mouth daily.   thiamine 100 MG tablet Take 100 mg by mouth daily.   zinc sulfate 220 (50 Zn) MG capsule Take 1 capsule (220 mg total)  by mouth daily. Start taking on: October 15, 2019      Allergies  Allergen Reactions  . Morphine And Related Other (See Comments) and Hives    "makes me mean as a snake"   . Nitrofurantoin Other (See Comments)    Ruptured tendin  Ruptured tendin   . Trazodone And Nefazodone Rash  . Contrast Media [Iodinated Diagnostic Agents] Other (See Comments)    AFIB  . Morphine     agression  . Ciprocin-Fluocin-Procin  [Fluocinolone] Rash  . Ciprofloxacin Rash    Follow-up Information    [facility] Follow up.   Why: Home Hospice referral made- they will call you to set up home hospice arrangements Contact information: [address] [place] [address] [phone]                The results of significant diagnostics from this hospitalization (including imaging, microbiology, ancillary and laboratory) are listed below for reference.    Significant Diagnostic Studies: CT ABDOMEN PELVIS WO CONTRAST  Result Date: 10/07/2019 CLINICAL DATA:  Possible intra-abdominal abscess. Sacral wound. Sepsis. EXAM: CT ABDOMEN AND PELVIS WITHOUT CONTRAST TECHNIQUE: Multidetector CT imaging of the abdomen and pelvis was performed following the standard protocol without IV contrast. COMPARISON:  06/04/2018 FINDINGS: Lower chest: Lung bases demonstrate small bilateral pleural effusions. Mild bibasilar dependent atelectasis. Calcification of the mitral valve annulus. Hepatobiliary: Previous cholecystectomy. Liver and biliary tree are normal. Pancreas: Normal. Spleen: Normal. Adrenals/Urinary Tract: Adrenal glands are normal.  Kidneys are normal in size without hydronephrosis. No focal mass or nephrolithiasis. Ureters and bladder are unremarkable. Stomach/Bowel: Stomach and small bowel are normal. Previous appendectomy. Mild wall thickening over the rectum which may be due to proctitis. Colon is otherwise unremarkable. Vascular/Lymphatic: Calcified plaque over the abdominal aorta which is normal in caliber. No evidence of adenopathy. Reproductive: Previous hysterectomy. Other: No free fluid or focal inflammatory change within the abdomen. Musculoskeletal: Evidence of patient's known midline sacral decubitus ulcer. Mottled air collection within the subcutaneous fat right of midline just deep to the ulceration measuring 1.6 x 5 cm possibly developing subcutaneous abscess. Area of ulceration extends up to the superficial surface of the sacrum. Suggestion of minimal bone destruction of the adjacent right side of the sacrum likely representing osteomyelitis. Degenerative change of the spine with posterior fusion hardware intact from L4-S1. IMPRESSION: 1. Evidence of patient's known midline sacral decubitus ulcer. Mottled air collection within the subcutaneous fat right of midline just deep to the ulceration measuring 1.6 x 5 cm possibly developing subcutaneous abscess. Suggestion of minimal bone destruction of the adjacent right side of the sacrum likely representing osteomyelitis. 2. Circumferential wall thickening over the rectum which may be due to acute proctitis. 3. Small bilateral pleural effusions with mild bibasilar atelectasis. 4. Aortic atherosclerosis. Aortic Atherosclerosis (ICD10-I70.0). Electronically Signed   By: Elberta Fortis M.D.   On: 10/07/2019 19:53   DG Chest Port 1 View  Result Date: 10/07/2019 CLINICAL DATA:  Questionable sepsis.  Evaluate for pneumonia. EXAM: PORTABLE CHEST 1 VIEW COMPARISON:  06/05/2018 FINDINGS: Patient rotated left. The Chin overlies the left apex. Prior median sternotomy. Cardiomegaly  accentuated by AP portable technique. No pleural effusion or pneumothorax. Diffuse peribronchial thickening. Vague increased density projecting over the left lung base, favored to be due to overlying soft tissues. Clear right lung. IMPRESSION: 1. No acute cardiopulmonary disease. 2. Peribronchial thickening which may relate to chronic bronchitis or smoking. 3. Likely artifactual density projecting over the left lung base on this  oblique portable radiograph. If left lower lobe pneumonia is a concern, consider PA and lateral radiographs. Electronically Signed   By: Abigail Miyamoto M.D.   On: 10/07/2019 16:44   DG Swallowing Func-Speech Pathology  Result Date: 10/10/2019 Objective Swallowing Evaluation: Type of Study: MBS-Modified Barium Swallow Study  Patient Details Name: Amber Bean MRN: [id number] Date of Birth: 25-Apr-1940 Today's Date: 10/10/2019 Time: SLP Start Time (ACUTE ONLY): 1320 -SLP Stop Time (ACUTE ONLY): 1338 SLP Time Calculation (min) (ACUTE ONLY): 18 min Past Medical History: Past Medical History: Diagnosis Date . Anemia, iron deficiency  . Anemia, vitamin B12 deficiency  . BACK PAIN, CHRONIC  . CAD (coronary artery disease)   S/P CABG in 1995. Last cath in 2003 demonstrated left main 20% stenosis, LAD 50-70% mid stenosis, circumflex 40-50% stenosis in the AV groove, 30% right coronary artery stenosis. Saphenous vein graft to the marginal was patent. A LIMA to the LAD was patent. Marland Kitchen CAROTID ARTERY STENOSIS  . Diabetes mellitus   Diet controlled since weight loss . GERD (gastroesophageal reflux disease)  . Hypothyroidism  . Mitral valve disorders(424.0)  . Obstructive sleep apnea on CPAP   intol of CPAP - noncompliant . Osteopenia  . Pancreatitis  . 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 Tobacco abuse   Quit 02/2010 Past Surgical History: Past Surgical History: Procedure Laterality Date . ABDOMINAL HYSTERECTOMY   . APPENDECTOMY   . BIOPSY  10/09/2019  Procedure: BIOPSY;  Surgeon:  Rush Landmark Telford Nab., MD;  Location: [facility];  Service: Gastroenterology;; . BREAST LUMPECTOMY   . CARDIAC CATHETERIZATION  2003 . [place], [place] . ESOPHAGOGASTRODUODENOSCOPY N/A 10/09/2019  Procedure: ESOPHAGOGASTRODUODENOSCOPY (EGD);  Surgeon: Irving Copas., MD;  Location: [facility];  Service: Gastroenterology;  Laterality: N/A; . HEMOSTASIS CLIP PLACEMENT  10/09/2019  Procedure: HEMOSTASIS CLIP PLACEMENT;  Surgeon: Irving Copas., MD;  Location: [facility];  Service: Gastroenterology;; . HOT HEMOSTASIS N/A 10/09/2019  Procedure: HOT HEMOSTASIS (ARGON PLASMA COAGULATION/BICAP);  Surgeon: Irving Copas., MD;  Location: [facility];  Service: Gastroenterology;  Laterality: N/A; . LUMBAR FUSION   . SAPHENOUS VEIN GRAFT RESECTION  2003 . TONSILLECTOMY   HPI: Pt is a 79 yo female with recent hospitalization [facility]) in June for UTI/colitis/COVID. She is admitted from SNF with sepsis from sacral ulcer. GI also consulted for possible GIB. MBS was completed at [facility] in June 2021 revealing cord level penetration with thin/nectar thick liquids. FEES repeated several days later showed improvements including adequate pharyngeal function but reduced oral control and coordination. Dys 2 (chopped) diet and thin liquids recommended. PMH includes: dementia, DM, previous tobacco use, anemia, CAD, HTN, GERD  Subjective: alert, minimally communicative Assessment / Plan / Recommendation CHL IP CLINICAL IMPRESSIONS 10/10/2019 Clinical Impression Pt presents with oropharyngeal dysphagia characterized by impaired mastication, reduced lingual retraction, and a pharyngeal delay. Mastication was significantly prolonged and mechanical soft (chopped) solids were ultimately spat out by pt due to her difficulty with mastication. She demonstrated vallecular residue with solids and penetration (PAS 3) of thin liquids which was inconsistently eliminated with prompted use of  smaller boluses; however, she exhibited difficulty consistently utilizing this strategy. Pt demonstrated throat clearing between swallows of thin liquids, and aspiration of penetrated material is therefore suspected. No instances of penetration/aspiration were noted with nectar thick liquids. A cricopharyngeal bar was noted during deglutition of liquids but esophageal phase was [facility]. A dysphagia 1 (puree) diet with nectar thick liquids is recommended at  this time. SLP will continue to follow pt and prognosis for advancement is judged to be good if her mentation improves further.  SLP Visit Diagnosis Dysphagia, oropharyngeal phase (R13.12) Attention and concentration deficit following -- Frontal lobe and executive function deficit following -- Impact on safety and function Moderate aspiration risk   CHL IP TREATMENT RECOMMENDATION 10/10/2019 Treatment Recommendations Therapy as outlined in treatment plan below   Prognosis 10/10/2019 Prognosis for Safe Diet Advancement Good Barriers to Reach Goals Cognitive deficits Barriers/Prognosis Comment -- CHL IP DIET RECOMMENDATION 10/10/2019 SLP Diet Recommendations Dysphagia 1 (Puree) solids;Nectar thick liquid Liquid Administration via Cup;Straw Medication Administration Whole meds with liquid Compensations Slow rate;Small sips/bites Postural Changes Seated upright at 90 degrees   CHL IP OTHER RECOMMENDATIONS 10/10/2019 Recommended Consults -- Oral Care Recommendations Oral care BID Other Recommendations --   CHL IP FOLLOW UP RECOMMENDATIONS 10/10/2019 Follow up Recommendations Skilled Nursing facility   [facility] IP FREQUENCY AND DURATION 10/10/2019 Speech Therapy Frequency (ACUTE ONLY) min 2x/week Treatment Duration 2 weeks      CHL IP ORAL PHASE 10/10/2019 Oral Phase Impaired Oral - Pudding Teaspoon -- Oral - Pudding Cup -- Oral - Honey Teaspoon -- Oral - Honey Cup -- Oral - Nectar Teaspoon -- Oral - Nectar Cup -- Oral - Nectar Straw WFL Oral - Thin Teaspoon -- Oral - Thin Cup WFL Oral  - Thin Straw WFL Oral - Puree WFL Oral - Mech Soft Impaired mastication Oral - Regular Impaired mastication Oral - Multi-Consistency -- Oral - Pill -- Oral Phase - Comment --  CHL IP PHARYNGEAL PHASE 10/10/2019 Pharyngeal Phase Impaired Pharyngeal- Pudding Teaspoon -- Pharyngeal -- Pharyngeal- Pudding Cup -- Pharyngeal -- Pharyngeal- Honey Teaspoon -- Pharyngeal -- Pharyngeal- Honey Cup -- Pharyngeal -- Pharyngeal- Nectar Teaspoon -- Pharyngeal -- Pharyngeal- Nectar Cup -- Pharyngeal -- Pharyngeal- Nectar Straw Delayed swallow initiation-pyriform sinuses;Delayed swallow initiation-vallecula Pharyngeal -- Pharyngeal- Thin Teaspoon -- Pharyngeal -- Pharyngeal- Thin Cup -- Pharyngeal -- Pharyngeal- Thin Straw Delayed swallow initiation-pyriform sinuses;Delayed swallow initiation-vallecula Pharyngeal -- Pharyngeal- Puree Delayed swallow initiation-pyriform sinuses;Delayed swallow initiation-vallecula;Pharyngeal residue - valleculae;Reduced tongue base retraction Pharyngeal -- Pharyngeal- Mechanical Soft Delayed swallow initiation-pyriform sinuses;Delayed swallow initiation-vallecula;Pharyngeal residue - valleculae;Reduced tongue base retraction Pharyngeal -- Pharyngeal- Regular Delayed swallow initiation-pyriform sinuses;Delayed swallow initiation-vallecula;Pharyngeal residue - valleculae;Reduced tongue base retraction Pharyngeal -- Pharyngeal- Multi-consistency -- Pharyngeal -- Pharyngeal- Pill -- Pharyngeal -- Pharyngeal Comment --  CHL IP CERVICAL ESOPHAGEAL PHASE 10/10/2019 Cervical Esophageal Phase (No Data) Pudding Teaspoon -- Pudding Cup -- Honey Teaspoon -- Honey Cup -- Nectar Teaspoon -- Nectar Cup -- Nectar Straw -- Thin Teaspoon -- Thin Cup -- Thin Straw -- Puree -- Mechanical Soft -- Regular -- Multi-consistency -- Pill -- Cervical Esophageal Comment -- Shanika I. Hardin Negus, [place], [place] Office number [phone] Pager [phone] Horton Marshall 10/10/2019, 2:36 PM                Microbiology: Recent Results (from the past 240 hour(s))  Blood Culture (routine x 2)     Status: None   Collection Time: 10/07/19  4:06 PM   Specimen: BLOOD RIGHT FOREARM  Result Value Ref Range Status   Specimen Description BLOOD RIGHT FOREARM  Final   Special Requests   Final    BOTTLES DRAWN AEROBIC AND ANAEROBIC Blood Culture results may not be optimal due to an inadequate volume of blood received in culture bottles   Culture   Final    NO GROWTH 5 DAYS Performed at [facility], [address] [place], [place] [address]    Report Status 10/12/2019 FINAL  Final  Blood Culture (routine x 2)     Status: None   Collection Time: 10/07/19  4:11 PM   Specimen: BLOOD RIGHT HAND  Result Value Ref Range Status   Specimen Description BLOOD RIGHT HAND  Final   Special Requests   Final    BOTTLES DRAWN AEROBIC AND ANAEROBIC Blood Culture adequate volume   Culture   Final    NO GROWTH 5 DAYS Performed at [facility], [place] [address] [place], [place] [address]    Report Status 10/12/2019 FINAL  Final  SARS Coronavirus 2 by RT PCR (hospital order, performed in [facility] hospital lab) Nasopharyngeal Nasopharyngeal Swab     Status: None   Collection Time: 10/07/19  4:31 PM   Specimen: Nasopharyngeal Swab  Result Value Ref Range Status   SARS Coronavirus 2 NEGATIVE NEGATIVE Final    Comment: (NOTE) SARS-CoV-2 target nucleic acids are NOT DETECTED.  The SARS-CoV-2 RNA is generally detectable in upper and lower respiratory specimens during the acute phase of infection. The lowest concentration of SARS-CoV-2 viral copies this assay can detect is 250 copies / mL. A negative result does not preclude SARS-CoV-2 infection and should not be used as the sole basis for treatment or other patient management decisions.  A negative result may occur with improper specimen collection / handling, submission of specimen other than nasopharyngeal swab, presence of viral mutation(s)  within the areas targeted by this assay, and inadequate number of viral copies (<250 copies / mL). A negative result must be combined with clinical observations, patient history, and epidemiological information.  Fact Sheet for Patients:   StrictlyIdeas.no  Fact Sheet for Healthcare Providers: BankingDealers.co.za  This test is not yet approved or  cleared by the [place] FDA and has been authorized for detection and/or diagnosis of SARS-CoV-2 by FDA under an Emergency Use Authorization (EUA).  This EUA will remain in effect (meaning this test can be used) for the duration of the COVID-19 declaration under Section 564(b)(1) of the Act, 21 U.S.C. section 360bbb-3(b)(1), unless the authorization is terminated or revoked sooner.  Performed at [facility], [place] [address] [place],  [address]      Labs: Basic Metabolic Panel: Recent Labs  Lab 10/08/19 0036 10/09/19 0145 10/10/19 0437 10/11/19 0542 10/14/19 0144  NA 138 140 141 138 141  K 4.3 3.1* 3.1* 3.6 3.9  CL 103 104 106 103 103  CO2 $Re'26 25 25 27 30  'mEE$ GLUCOSE 105* 83 77 82 86  BUN 11 8 <5* 5* 5*  CREATININE 0.54 0.45 0.47 0.51 0.45  CALCIUM 8.7* 8.6* 8.6* 8.1* 8.8*  MG  --   --  1.5* 2.0  --   PHOS  --   --   --  2.6  --    Liver Function Tests: Recent Labs  Lab 10/07/19 1507 10/08/19 0036 10/09/19 0145  AST $Re'18 20 16  'fiF$ ALT 32 31 22  ALKPHOS 85 82 74  BILITOT 0.4 0.5 0.5  PROT 5.9* 5.7* 5.0*  ALBUMIN 2.0* 2.0* 1.8*   No results for input(s): LIPASE, AMYLASE in the last 168 hours. No results for input(s): AMMONIA in the last 168 hours. CBC: Recent Labs  Lab 10/07/19 1507 10/07/19 2157 10/09/19 0145 10/10/19 0437 10/11/19 0542 10/13/19 1150 10/14/19 0144  WBC 14.6*   < > 11.7* 11.4* 13.8* 15.3* 14.1*  NEUTROABS 10.9*  --  7.9*  8.1* 9.8*  --   --   HGB 7.6*   < > 7.9* 8.0* 9.4* 9.5* 8.9*  HCT 27.5*   < > 26.2* 27.7* 31.7* 32.9* 30.7*  MCV  81.1   < > 81.9 82.2 82.1 82.7 83.2  PLT 837*   < > 702* 655* 664* 671* 598*   < > = values in this interval not displayed.   Cardiac Enzymes: No results for input(s): CKTOTAL, CKMB, CKMBINDEX, TROPONINI in the last 168 hours. BNP: BNP (last 3 results) No results for input(s): BNP in the last 8760 hours.  ProBNP (last 3 results) No results for input(s): PROBNP in the last 8760 hours.  CBG: Recent Labs  Lab 10/08/19 0905 10/08/19 1108 10/08/19 1614 10/13/19 1559 10/13/19 2119  GLUCAP 90 91 84 86 103*       Signed:  Dia Crawford, MD [facility] [phone] pager

## 2019-10-14 NOTE — Progress Notes (Signed)
Physical Therapy Wound Treatment Patient Details  Name: Amber Bean MRN: [id number] Date of Birth: 26-Sep-1940  Today's Date: 10/14/2019 Time: 0820-0852 Time Calculation (min): 32 min  Subjective  Subjective: Pt sleeping through most of treatment Patient and Family Stated Goals: unable to state Date of Onset:  (unknown) Prior Treatments: dressing  Pain Score: Pain Score: Asleep, but awakened and called out at times when over bony areas  Noted patient is now to go home with Hospice services. Daughter continues to want hydrotherapy, wound care, and antibiotics. Noted patient appeared to have increased pain today compared to previous dates (and was pre-medicated with Tylenol). Question if hydrotherapy remains appropriate if goal is comfort? Will defer to medical team and their discussions with daugther.   Wound Assessment  Pressure Injury 10/07/19 Coccyx Right;Left;Medial Unstageable - Full thickness tissue loss in which the base of the injury is covered by slough (yellow, tan, gray, green or brown) and/or eschar (tan, brown or black) in the wound bed. (Active)  Wound Image   10/09/19 1500  Dressing Type ABD;Barrier Film (skin prep);Gauze (Comment);Normal saline moist dressing;Tape dressing;Other (Comment) 10/14/19 0902  Dressing Changed 10/14/19 0902  Dressing Change Frequency Twice a day 10/14/19 0902  State of Healing Early/partial granulation 10/14/19 0902  Site / Wound Assessment Brown;Granulation tissue;Yellow;Red 10/14/19 0902  % Wound base Red or Granulating 40% 10/14/19 0902  % Wound base Yellow/Fibrinous Exudate 50% 10/14/19 0902  % Wound base Black/Eschar 45% 10/13/19 1306  % Wound base Other/Granulation Tissue (Comment) 10% 10/14/19 0902  Peri-wound Assessment Erythema (blanchable) 10/13/19 1306  Wound Length (cm) 8.7 cm 10/10/19 1300  Wound Width (cm) 9.4 cm 10/10/19 1300  Wound Depth (cm) 3.4 cm 10/10/19 1300  Wound Surface Area (cm^2) 81.78 cm^2 10/10/19 1300  Wound  Volume (cm^3) 278.05 cm^3 10/10/19 1300  Undermining (cm) 1.4 at 12 o clock and 5.1 at 3 o clock 10/12/19 1125  Margins Unattached edges (unapproximated) 10/14/19 0902  Drainage Amount Minimal 10/14/19 0902  Drainage Description Serosanguineous;No odor 10/14/19 0902  Treatment Cleansed;Debridement (Selective);Hydrotherapy (Pulse lavage);Packing (Saline gauze) 10/14/19 0902   Hydrotherapy Pulsed lavage therapy - wound location: sacrum Pulsed Lavage with Suction (psi): 4 psi (4-8 psi, limited by discomfort) Pulsed Lavage with Suction - Normal Saline Used: 1000 mL Pulsed Lavage Tip: Tip with splash shield Selective Debridement Selective Debridement - Location: sacrum  Selective Debridement - Tools Used: Forceps;Scalpel;Scissors Selective Debridement - Tissue Removed: brown eschar, yellow necrotic tissue   Wound Assessment and Plan  Wound Therapy - Assess/Plan/Recommendations Wound Therapy - Clinical Statement: Noted patient is now to go home with Hospice services. Daughter continues to want hydrotherapy, wound care, and antibiotics. Noted patient appeared to have increased pain today compared to previous dates (and was pre-medicated with Tylenol). Question if hydrotherapy remains appropriate if goal is comfort? Will defer to medical team and their discussions with daugther.  Wound Therapy - Functional Problem List: decrease mobility Factors Delaying/Impairing Wound Healing: Multiple medical problems;Immobility Hydrotherapy Plan: Debridement;Pulsatile lavage with suction;Dressing change Wound Therapy - Frequency: 6X / week Wound Therapy - Follow Up Recommendations: Home health RN;Other (comment) (daughter plans home with Hospice) Wound Plan: see above  Wound Therapy Goals- Improve the function of patient's integumentary system by progressing the wound(s) through the phases of wound healing (inflammation - proliferation - remodeling) by: Decrease Necrotic Tissue to: 70 Decrease Necrotic  Tissue - Progress: Progressing toward goal Increase Granulation Tissue to: 30 Increase Granulation Tissue - Progress: Progressing toward goal Improve Drainage Characteristics: Min Improve Drainage Characteristics -  Progress: Progressing toward goal Goals/treatment plan/discharge plan were made with and agreed upon by patient/family: No, Patient unable to participate in goals/treatment/discharge plan and family unavailable Time For Goal Achievement: 7 days Wound Therapy - Potential for Goals: Fair  Goals will be updated until maximal potential achieved or discharge criteria met.  Discharge criteria: when goals achieved, discharge from hospital, MD decision/surgical intervention, no progress towards goals, refusal/missing three consecutive treatments without notification or medical reason.  GP      Arby Barrette, PT Pager [phone]  [name] 10/14/2019, 9:11 AM

## 2019-10-15 ENCOUNTER — Encounter: Payer: Self-pay | Admitting: Gastroenterology

## 2019-10-17 NOTE — Progress Notes (Signed)
Called patient to get her scheduled for a follow up with Dr. Leone Payor in about 6-8 weeks, daughter answers and states that she will call us back if patient needs an appt, she states that patient is currently having hospice come out to home and she is not sure how long the patient has left

## 2019-11-24 DEATH — deceased

## 2020-03-25 NOTE — Progress Notes (Signed)
Post discharge note:  03/25/20 15:30 daughter Terance Hart [employer] called and wanted to speak to someone about picking up her mother's reading glasses. Pt discharged 10/14/19 to hospice. Patient passed Nov 14, 2019. Daughter dealing with other issues and stated someone had called her about picking up her glasses but she was not able to pick up. Safety zone written concerning matter and daughter referred to office of Patient Experience. DD Joy made aware of situation. Thomas Hoff, RN
